# Patient Record
Sex: Female | Born: 1960
Health system: Southern US, Community
[De-identification: ages and names within clinical notes are randomized; demographics above are authoritative.]

## PROBLEM LIST (undated history)

## (undated) DIAGNOSIS — E559 Vitamin D deficiency, unspecified: Secondary | ICD-10-CM

## (undated) DIAGNOSIS — E669 Obesity, unspecified: Secondary | ICD-10-CM

## (undated) DIAGNOSIS — T7840XA Allergy, unspecified, initial encounter: Secondary | ICD-10-CM

## (undated) DIAGNOSIS — I10 Essential (primary) hypertension: Secondary | ICD-10-CM

## (undated) DIAGNOSIS — E785 Hyperlipidemia, unspecified: Secondary | ICD-10-CM

## (undated) HISTORY — DX: Vitamin D deficiency, unspecified: E55.9

## (undated) HISTORY — DX: Allergy, unspecified, initial encounter: T78.40XA

## (undated) HISTORY — DX: Hyperlipidemia, unspecified: E78.5

## (undated) HISTORY — DX: Essential (primary) hypertension: I10

## (undated) HISTORY — DX: Obesity, unspecified: E66.9

---

## 1986-11-07 HISTORY — PX: HEMORRHOID SURGERY: SHX153

## 1989-11-07 HISTORY — PX: ABDOMINAL HYSTERECTOMY: SHX81

## 1995-11-08 HISTORY — PX: BREAST BIOPSY: SHX20

## 1999-09-06 ENCOUNTER — Other Ambulatory Visit: Admission: RE | Admit: 1999-09-06 | Discharge: 1999-09-06 | Payer: Self-pay | Admitting: Family Medicine

## 2000-09-27 ENCOUNTER — Other Ambulatory Visit: Admission: RE | Admit: 2000-09-27 | Discharge: 2000-09-27 | Payer: Self-pay | Admitting: Family Medicine

## 2007-09-11 ENCOUNTER — Ambulatory Visit: Payer: Self-pay | Admitting: Family Medicine

## 2009-03-26 ENCOUNTER — Ambulatory Visit: Payer: Self-pay | Admitting: Family Medicine

## 2011-10-19 ENCOUNTER — Ambulatory Visit: Payer: Self-pay | Admitting: Family Medicine

## 2013-01-24 ENCOUNTER — Ambulatory Visit: Payer: Self-pay | Admitting: Family Medicine

## 2013-02-05 ENCOUNTER — Ambulatory Visit: Payer: Self-pay | Admitting: Family Medicine

## 2013-02-26 ENCOUNTER — Inpatient Hospital Stay: Payer: Self-pay | Admitting: Internal Medicine

## 2013-02-26 LAB — URINALYSIS, COMPLETE
Bilirubin,UR: NEGATIVE
Glucose,UR: NEGATIVE mg/dL (ref 0–75)
Nitrite: NEGATIVE
Protein: NEGATIVE
Squamous Epithelial: NONE SEEN

## 2013-02-26 LAB — COMPREHENSIVE METABOLIC PANEL
Anion Gap: 13 (ref 7–16)
Bilirubin,Total: 0.4 mg/dL (ref 0.2–1.0)
Calcium, Total: 8.3 mg/dL — ABNORMAL LOW (ref 8.5–10.1)
Co2: 24 mmol/L (ref 21–32)
Creatinine: 0.8 mg/dL (ref 0.60–1.30)
EGFR (African American): >60
Glucose: 110 mg/dL — ABNORMAL HIGH (ref 65–99)
Potassium: 3.1 mmol/L — ABNORMAL LOW (ref 3.5–5.1)
SGPT (ALT): 45 U/L (ref 12–78)
Sodium: 112 mmol/L — CL (ref 136–145)
Total Protein: 7.8 g/dL (ref 6.4–8.2)

## 2013-02-26 LAB — CBC
MCHC: 35.5 g/dL (ref 32.0–36.0)
RDW: 13.1 % (ref 11.5–14.5)
WBC: 9.9 10*3/uL (ref 3.6–11.0)

## 2013-02-26 LAB — TROPONIN I: Troponin-I: <0.02 ng/mL

## 2013-02-27 LAB — BASIC METABOLIC PANEL
Anion Gap: 5 — ABNORMAL LOW (ref 7–16)
BUN: 7 mg/dL (ref 7–18)
Calcium, Total: 8.1 mg/dL — ABNORMAL LOW (ref 8.5–10.1)
Chloride: 90 mmol/L — ABNORMAL LOW (ref 98–107)
Chloride: 91 mmol/L — ABNORMAL LOW (ref 98–107)
Co2: 29 mmol/L (ref 21–32)
Creatinine: 0.66 mg/dL (ref 0.60–1.30)
Creatinine: 0.79 mg/dL (ref 0.60–1.30)
EGFR (African American): >60
EGFR (Non-African Amer.): >60
EGFR (Non-African Amer.): >60
Glucose: 114 mg/dL — ABNORMAL HIGH (ref 65–99)
Osmolality: 252 (ref 275–301)
Potassium: 3.2 mmol/L — ABNORMAL LOW (ref 3.5–5.1)
Sodium: 124 mmol/L — ABNORMAL LOW (ref 136–145)
Sodium: 126 mmol/L — ABNORMAL LOW (ref 136–145)

## 2013-02-27 LAB — SODIUM: Sodium: 130 mmol/L — ABNORMAL LOW (ref 136–145)

## 2014-03-13 ENCOUNTER — Ambulatory Visit: Payer: Self-pay | Admitting: Family Medicine

## 2015-02-27 NOTE — H&P (Signed)
PATIENT NAME:  Christie Mcdonald, Christie Mcdonald MR#:  [id number] DATE OF BIRTH:  16-Feb-1961  DATE OF ADMISSION:  02/26/2013  PRIMARY CARE PHYSICIAN:  Dr. Dennison Mascot.   CHIEF COMPLAINT: Nausea, vomiting.   HISTORY OF PRESENT ILLNESS: A 54 year old female patient with past medical history of hypertension, has been having nausea and vomiting since Sunday. The patient is unable to keep anything down and having nausea, vomiting, which started on Sunday night after she visited her in-laws house. The patient's, I think,  mother-in-law was sick with nausea, vomiting, and stomach bug.  The patient started to have symptoms after she visited them and at the symptoms that started Sunday night, associated with vomiting and also 3 episodes of diarrhea. Her main complaint is nausea and vomiting, unable to keep anything down. The patient went to Dr. Clance Boll office today and found that she was confused with loss of balance and severely dehydrated, so she was sent here. The patient also had a fall at home today. According to her family, she is a little confused today and also feeling very weak and dehydrated and dizzy. She had multiple syncopal episodes with dizziness today. The patient found to have sodium of 112 in the Emergency Room on blood work. The patient had one dose of Phenergan in Dr. Clance Boll office and right now she does not have any nausea, denies any abdominal pain.   PAST MEDICAL HISTORY:  Significant for hypertension. She recently started on lisinopril 10 days ago by Dr. Thana Ates. She is also took one dose of HCTZ on Thursday or Friday to help with blood pressure, but it gave her a lot of cramps, so she did not take any further doses.   ALLERGIES: No known allergies.   SOCIAL HISTORY: No smoking. No drinking. Lives with her husband.   PAST SURGICAL HISTORY: Significant for hysterectomy, also significant for history of multiple hemorrhoid surgeries.    FAMILY HISTORY: Significant for hypertension in the  mother.    MEDICATIONS: Lisinopril 20 mg daily, Phenergan 25 mg every 6 hours p.r.n. and atropine with diphenoxylate 0.25/2.5 mg every 6 hours as needed for diarrhea.   HOSPITALIZATIONS:  The patient had no previous hospitalizations here.   REVIEW OF SYSTEMS:   CONSTITUTIONAL: The patient feels very weak and tired. No fever.  EYES: No blurred vision.  ENT: No tinnitus. No epistaxis. No difficulty swallowing.  RESPIRATORY: No cough.  CARDIOVASCULAR: No chest pain. No orthopnea.  GASTROINTESTINAL: The patient has nausea, vomiting since Sunday, (GENITOURINARY: No dysuria. ENDOCRINE: No polyuria, no polydipsia.  INTEGUMENTARY: Skin is very dry.  MUSCULOSKELETAL: No joint pain.  NEUROLOGIC: The patient is confused, as per the family, that is today and also had multiple episodes of syncope today.  PSYCHIATRIC: The patient is slightly anxious.    PHYSICAL EXAMINATION: VITAL SIGNS: Temperature is 98.2, heart rate 101, blood pressure 122/81, respirations 18, sats 100% on room air.  GENERAL:  The patient is alert, awake and oriented. Says that she could not remember some things and she feels a little confused even though she is able to answer all of my questions.  HEENT: Head atraumatic, normocephalic. Pupils equally reacting to light. Extraocular movements are intact.  ENT: No tympanic membrane congestion. No turbinate hypertrophy. No oropharyngeal erythema.  NECK: Normal range of motion. No JVD. No carotid bruit.  CARDIOVASCULAR: S1, S2 regular. No murmurs.  LUNGS: Clear to auscultation. No wheeze. No rales.  ABDOMEN: Soft, nontender, nondistended. Bowel sounds present.  EXTREMITIES: No extremity edema. No cyanosis. No clubbing.  NEUROLOGIC: The patient is alert, awake, oriented. She is slow to respond. Cranial nerves II through XII intact. Power 5/5 in upper and lower extremities. Sensation is intact. Deep tendon reflexes 2+ bilaterally.  PSYCHIATRIC: Mood and affect are within normal limits.    LABORATORY, DIAGNOSTIC AND RADIOLOGICAL DATA:   Electrolytes: Sodium 112, potassium 3.1, chloride 75, bicarbonate 24, BUN 10, creatinine 0.80, glucose 110. LFTs within normal limits. Urinalysis is clear and trace leukocyte esterase, WBC in the urine 2, bacteria none CBC: WBC 9.9, hemoglobin 13.4, hematocrit 37.7, platelets 31.8. Troponin less than 0.02.   EKG shows sinus rhythm with short PR of 100 beats per minute. No ST-T changes.   ASSESSMENT AND PLAN: This is a 54 year old female with intractable nausea and vomiting for 3 days with episodes of syncope and confusion today.  1.  Severe hypernatremia causing confusion and also syncope. Admit her to hospitalist service on telemetry. Check BMP every 4 hours, continue normal saline at 60 mL.  2.  Severe dehydration due to nausea, vomiting, likely due to stomach. Continue IV fluids and continue proton pump inhibitors and start her on clear liquid diet. Continue nausea medication.  3.  Hypertension. The patient's blood pressure is 122/80 and we can continue her lisinopril.  4.  Hyperkalemia secondary to dehydration with nausea, vomiting. Replace the potassium and IV fluids.  5. Altered mental status, metabolic encephalopathy secondary to hyponatremia, improving. We will follow the patient and continue to monitor on telemetry.   TIME SPENT:  About 55 minutes.     ____________________________ Katha HammingSnehalatha Eytan Carrigan, MD sk:cc D: 02/26/2013 20:30:12 ET T: 02/26/2013 21:11:19 ET JOB#: [id number]  cc: Katha HammingSnehalatha Shelah Heatley, MD, <Dictator> Dennison MascotLemont Morrisey, MD Katha HammingSNEHALATHA Kaz Auld MD ELECTRONICALLY SIGNED 03/18/2013 11:46

## 2015-02-27 NOTE — Discharge Summary (Signed)
PATIENT NAME:  Christie Mcdonald, Christie Mcdonald MR#:  [id number] DATE OF BIRTH:  January 13, 1961  DATE OF ADMISSION:  02/26/2013 DATE OF DISCHARGE:  02/27/2013  ADMISSION DIAGNOSES:  1.  Nausea and vomiting.  2.  Hyponatremia.   SECONDARY DIAGNOSES: 1.  HYPONATREMIA, POSSIBLY FROM THALIDONE, and nausea and vomiting.  2.  Nausea and vomiting, likely secondary to hyponatremia versus viral etiology.  3.  History of hypertension.   LABORATORIES: Sodium at discharge was 130. BUN and creatinine 7 and 0.79.   CT of the head showed no acute intracranial hemorrhage or CVA.   HOSPITAL COURSE:  This is a 54 year old female who presented with nausea and vomiting, found to have severe hyponatremia. For further details, please refer to the H and P.   1.  Hyponatremia: I suspect this was medication-induced. The patient was on thalidone, which could have caused hyponatremia, and then she had nausea and vomiting, probably from the hyponatremia, versus a viral infection which exacerbated her low sodium level. She was placed on IV fluids, and her sodium level did correct fairly rapidly over 24 hours. However, she has no neurological deficits. A CT of the head was non-acute. She has no neurological deficits. Her sodium at discharge is 130 after stopping any IV fluids.  2.  Nausea and vomiting, likely from viral gastroenteritis versus hyponatremia, which resolved, and she is tolerating her diet.  3.  Hypokalemia which was repleted. This was also from nausea and vomiting.  4. Hypertension: The patient should discontinue thalidone.and she can continue her blood pressure medication. Her blood pressure is about 104 at discharge systolically, so we are holding the blood pressure medications until she checks her blood pressure and makes sure her blood pressure, systolic, is greater than 110.   DISCHARGE MEDICATIONS: Lisinopril 20 mg daily once the blood pressure is greater than 110/60.   DISCHARGE DIET: Regular.   DISCHARGE ACTIVITY:  As tolerated.   DISCHARGE FOLLOWUP: The patient will follow up with Dr. Dennison MascotLemont Morrisey in 1 week.   Time spent: Approximately 35 minutes.      __________________ Janyth ContesSital P. Juliene PinaMody, MD spm:dm D: 02/27/2013 13:33:06 ET T: 02/27/2013 14:02:25 ET JOB#: [id number]  cc: Tyion Boylen P. Juliene PinaMody, MD, <Dictator> Dennison MascotLemont Morrisey, MD Janyth ContesSITAL P Kharter Brew MD ELECTRONICALLY SIGNED 02/27/2013 15:28

## 2015-03-19 ENCOUNTER — Other Ambulatory Visit: Payer: Self-pay | Admitting: Family Medicine

## 2015-03-19 DIAGNOSIS — Z1231 Encounter for screening mammogram for malignant neoplasm of breast: Secondary | ICD-10-CM

## 2015-04-02 ENCOUNTER — Ambulatory Visit
Admission: RE | Admit: 2015-04-02 | Discharge: 2015-04-02 | Disposition: A | Discharge status: Home / Self Care | Payer: BLUE CROSS/BLUE SHIELD | Source: Ambulatory Visit | Attending: Family Medicine | Admitting: Family Medicine

## 2015-04-02 DIAGNOSIS — Z1231 Encounter for screening mammogram for malignant neoplasm of breast: Secondary | ICD-10-CM | POA: Diagnosis present

## 2015-04-23 ENCOUNTER — Ambulatory Visit (INDEPENDENT_AMBULATORY_CARE_PROVIDER_SITE_OTHER): Payer: BLUE CROSS/BLUE SHIELD | Admitting: Family Medicine

## 2015-04-23 ENCOUNTER — Encounter: Payer: Self-pay | Admitting: Family Medicine

## 2015-04-23 VITALS — BP 132/68 | HR 64 | Temp 98.3°F | Resp 16 | Ht 61.0 in | Wt 161.4 lb

## 2015-04-23 DIAGNOSIS — I10 Essential (primary) hypertension: Secondary | ICD-10-CM | POA: Insufficient documentation

## 2015-04-23 DIAGNOSIS — E785 Hyperlipidemia, unspecified: Secondary | ICD-10-CM | POA: Diagnosis not present

## 2015-04-23 NOTE — Patient Instructions (Signed)
F/U 4 MO 

## 2015-04-23 NOTE — Progress Notes (Signed)
Name: Christie Mcdonald   MRN: [id number]    DOB: [birth year]   Date:04/23/2015       Progress Note  Subjective  Chief Complaint  Chief Complaint  Patient presents with  . Hyperlipidemia  . Hypertension    Hyperlipidemia This is a chronic problem. The current episode started more than 1 year ago. The problem is controlled. Recent lipid tests were reviewed and are normal. She has no history of diabetes or hypothyroidism. Associated symptoms include myalgias. Pertinent negatives include no chest pain, focal weakness or shortness of breath. Current antihyperlipidemic treatment includes statins. The current treatment provides moderate improvement of lipids. Compliance problems include medication side effects (Taking Crestor 2-3 times per week as tolerated because of myalgias).  Risk factors for coronary artery disease include dyslipidemia, hypertension and a sedentary lifestyle.  Hypertension This is a chronic problem. The current episode started more than 1 year ago. The problem has been gradually improving since onset. The problem is controlled. Associated symptoms include anxiety. Pertinent negatives include no blurred vision, chest pain, headaches, neck pain, orthopnea, palpitations or shortness of breath. There are no associated agents to hypertension. Risk factors for coronary artery disease include dyslipidemia and sedentary lifestyle. Past treatments include angiotensin blockers and beta blockers. The current treatment provides moderate improvement. There are no compliance problems.  There is no history of angina, CAD/MI or CVA.     Past Medical History  Diagnosis Date  . Hypertension   . Hyperlipidemia   . Vitamin D deficiency   . Obesity     History  Substance Use Topics  . Smoking status: Former Smoker    Quit date: 06/26/2014  . Smokeless tobacco: Not on file  . Alcohol Use: No     Current outpatient prescriptions:  .  atenolol (TENORMIN) 50 MG tablet, TAKE ONE TABLET BY  MOUTH EACH EVENING, Disp: , Rfl: 1 .  CRESTOR 5 MG tablet, 5 mg 2 (two) times a week. , Disp: , Rfl: 4 .  fluticasone (FLONASE) 50 MCG/ACT nasal spray, 2 (TWO) SUSPENSION, NASAL, IN EACH NOSTRIL DAILY, Disp: , Rfl: 2 .  losartan (COZAAR) 100 MG tablet, , Disp: , Rfl: 4  Allergies  Allergen Reactions  . Hydrochlorothiazide Other (See Comments)  . Lisinopril Cough    Review of Systems  Constitutional: Negative for fever, chills and weight loss.  HENT: Negative for congestion, hearing loss, sore throat and tinnitus.   Eyes: Negative for blurred vision, double vision and redness.  Respiratory: Negative for cough, hemoptysis and shortness of breath.   Cardiovascular: Negative for chest pain, palpitations, orthopnea, claudication and leg swelling.  Gastrointestinal: Negative for heartburn, nausea, vomiting, diarrhea, constipation and blood in stool.  Genitourinary: Negative for dysuria, urgency, frequency and hematuria.  Musculoskeletal: Positive for myalgias. Negative for back pain, joint pain, falls and neck pain.  Skin: Negative for itching.  Neurological: Negative for dizziness, tingling, tremors, focal weakness, seizures, loss of consciousness, weakness and headaches.  Endo/Heme/Allergies: Does not bruise/bleed easily.  Psychiatric/Behavioral: Negative for depression and substance abuse. The patient is not nervous/anxious and does not have insomnia.      Objective  Filed Vitals:   04/23/15 1044  BP: 132/68  Pulse: 64  Temp: 98.3 F (36.8 C)  Resp: 16  Height:  (1.549 m)  Weight: 161 lb 7 oz (73.228 kg)  SpO2: 94%     Physical Exam  Constitutional: She is oriented to person, place, and time and well-developed, well-nourished, and in no distress.  HENT:  Head: Normocephalic.  Eyes: EOM are normal. Pupils are equal, round, and reactive to light.  Neck: Normal range of motion. No thyromegaly present.  Cardiovascular: Normal rate, regular rhythm and normal heart sounds.    No murmur heard. Pulmonary/Chest: Effort normal and breath sounds normal.  Abdominal: Soft. Bowel sounds are normal.  Musculoskeletal: Normal range of motion. She exhibits no edema.  Neurological: She is alert and oriented to person, place, and time. No cranial nerve deficit. Gait normal.  Skin: Skin is warm and dry. No rash noted.  Psychiatric: Memory and affect normal.      Assessment & Plan  1. Essential hypertension Stable   2. Hyperlipemia - Lipid Profile

## 2015-05-15 ENCOUNTER — Other Ambulatory Visit: Payer: Self-pay | Admitting: Emergency Medicine

## 2015-05-15 DIAGNOSIS — K649 Unspecified hemorrhoids: Secondary | ICD-10-CM

## 2015-05-21 ENCOUNTER — Ambulatory Visit (INDEPENDENT_AMBULATORY_CARE_PROVIDER_SITE_OTHER): Payer: BLUE CROSS/BLUE SHIELD | Admitting: General Surgery

## 2015-05-21 ENCOUNTER — Encounter: Payer: Self-pay | Admitting: General Surgery

## 2015-05-21 VITALS — BP 140/80 | HR 82 | Resp 12 | Ht 60.0 in | Wt 160.0 lb

## 2015-05-21 DIAGNOSIS — Z1211 Encounter for screening for malignant neoplasm of colon: Secondary | ICD-10-CM

## 2015-05-21 DIAGNOSIS — K648 Other hemorrhoids: Secondary | ICD-10-CM | POA: Diagnosis not present

## 2015-05-21 DIAGNOSIS — K642 Third degree hemorrhoids: Secondary | ICD-10-CM

## 2015-05-21 MED ORDER — POLYETHYLENE GLYCOL 3350 17 GM/SCOOP PO POWD: ORAL | Status: DC

## 2015-05-21 NOTE — Progress Notes (Signed)
Patient ID: Christie Mcdonald, female   DOB: 1961/07/22, 54 y.o.   MRN: [id number]  Chief Complaint  Patient presents with  . Other    hemorrhoids    HPI Christie Mcdonald is a 54 y.o. female here today for a evaluation of hemorrhoids. Patient states she had hemorrhoids for about 20 years now. She states she has had a hemorrhoidectomy and banding in distant past. Patient had a flareup last week, lots of bleeding.   HPI  Past Medical History  Diagnosis Date  . Hypertension   . Hyperlipidemia   . Vitamin D deficiency   . Obesity     Past Surgical History  Procedure Laterality Date  . Breast biopsy Left 1997    negative  . Abdominal hysterectomy  1991  . Hemorrhoid surgery  1988    Dr Lorelee Newimothy Davis    Family History  Problem Relation Age of Onset  . Breast cancer Paternal Grandfather   . Breast cancer Cousin     Social History History  Substance Use Topics  . Smoking status: Former Smoker    Quit date: 06/26/2014  . Smokeless tobacco: Never Used  . Alcohol Use: No    Allergies  Allergen Reactions  . Hydrochlorothiazide Other (See Comments)  . Lisinopril Cough    Current Outpatient Prescriptions  Medication Sig Dispense Refill  . atenolol (TENORMIN) 50 MG tablet TAKE ONE TABLET BY MOUTH EACH EVENING  1  . Cholecalciferol (VITAMIN D3) 5000 UNITS CAPS Take 1 capsule by mouth daily.    . CRESTOR 5 MG tablet 5 mg 2 (two) times a week.   4  . fluticasone (FLONASE) 50 MCG/ACT nasal spray 2 (TWO) SUSPENSION, NASAL, IN EACH NOSTRIL DAILY  2  . losartan (COZAAR) 100 MG tablet   4  . Vitamin D, Ergocalciferol, (DRISDOL) 50000 UNITS CAPS capsule Take 50,000 Units by mouth every 7 (seven) days.    . polyethylene glycol powder (GLYCOLAX/MIRALAX) powder 255 grams one bottle for colonoscopy prep 255 g 0   No current facility-administered medications for this visit.    Review of Systems Review of Systems  Constitutional: Negative.   Respiratory: Negative.    Cardiovascular: Negative.   Gastrointestinal: Positive for anal bleeding. Negative for nausea, vomiting, abdominal pain, diarrhea, constipation, abdominal distention and rectal pain.    Blood pressure 140/80, pulse 82, resp. rate 12, height 5' (1.524 m), weight 160 lb (72.576 kg).  Physical Exam Physical Exam  Constitutional: She is oriented to person, place, and time. She appears well-nourished.  Eyes: Conjunctivae are normal. No scleral icterus.  Neck: Neck supple.  Cardiovascular: Normal rate and normal heart sounds.   Pulmonary/Chest: Effort normal and breath sounds normal.  Abdominal: Soft. Normal appearance.  Genitourinary: Rectal exam shows internal hemorrhoid ( prominent prolapsing internal hemorrhoid anterior location).  Lymphadenopathy:    She has no cervical adenopathy.  Neurological: She is alert and oriented to person, place, and time.  Skin: Skin is warm and dry.    Data Reviewed Notes reviewed  Assessment    Prolapsing internal hemorrhoid. Pt also has not had a colonoscopy. Discussed with her    Plan    Colonoscopy with possible biopsy/polypectomy prn: Information regarding the procedure, including its potential risks and complications (including but not limited to perforation of the bowel, which may require emergency surgery to repair, and bleeding) was verbally given to the patient. Educational information regarding lower instestinal endoscopy was given to the patient. Written instructions for how to complete the bowel  prep using Miralax were provided. The importance of drinking ample fluids to avoid dehydration as a result of the prep emphasized.     Patient has been scheduled for a colonoscopy and hemorrhoid banding on 06-10-15 at [facility].    PCP:  Morrisey, Lemont  SANKAR,SEEPLAPUTHUR G 05/22/2015, 5:00 PM    

## 2015-05-21 NOTE — Patient Instructions (Addendum)
Colonoscopy A colonoscopy is an exam to look at the entire large intestine (colon). This exam can help find problems such as tumors, polyps, inflammation, and areas of bleeding. The exam takes about 1 hour.  LET [facility] CARE PROVIDER KNOW ABOUT:   Any allergies you have.  All medicines you are taking, including vitamins, herbs, eye drops, creams, and over-the-counter medicines.  Previous problems you or members of your family have had with the use of anesthetics.  Any blood disorders you have.  Previous surgeries you have had.  Medical conditions you have. RISKS AND COMPLICATIONS  Generally, this is a safe procedure. However, as with any procedure, complications can occur. Possible complications include:  Bleeding.  Tearing or rupture of the colon wall.  Reaction to medicines given during the exam.  Infection (rare). BEFORE THE PROCEDURE   Ask your health care provider about changing or stopping your regular medicines.  You may be prescribed an oral bowel prep. This involves drinking a large amount of medicated liquid, starting the day before your procedure. The liquid will cause you to have multiple loose stools until your stool is almost clear or light green. This cleans out your colon in preparation for the procedure.  Do not eat or drink anything else once you have started the bowel prep, unless your health care provider tells you it is safe to do so.  Arrange for someone to drive you home after the procedure. PROCEDURE   You will be given medicine to help you relax (sedative).  You will lie on your side with your knees bent.  A long, flexible tube with a light and camera on the end (colonoscope) will be inserted through the rectum and into the colon. The camera sends video back to a computer screen as it moves through the colon. The colonoscope also releases carbon dioxide gas to inflate the colon. This helps your health care provider see the area better.  During  the exam, your health care provider may take a small tissue sample (biopsy) to be examined under a microscope if any abnormalities are found.  The exam is finished when the entire colon has been viewed. AFTER THE PROCEDURE   Do not drive for 24 hours after the exam.  You may have a small amount of blood in your stool.  You may pass moderate amounts of gas and have mild abdominal cramping or bloating. This is caused by the gas used to inflate your colon during the exam.  Ask when your test results will be ready and how you will get your results. Make sure you get your test results. Document Released: 10/21/2000 Document Revised: 08/14/2013 Document Reviewed: 07/01/2013 [facility] Patient Information 2015 [place], [place]. This information is not intended to replace advice given to you by your health care provider. Make sure you discuss any questions you have with your health care provider. Hemorrhoid Banding Hemorrhoids are veins in the anus and lower rectum that become enlarged. The most common symptoms are rectal bleeding, itching, and sometimes pain. Hemorrhoids might come out with straining or having a bowel movement, and they can sometimes be pushed back in. There are internal and external hemorrhoids. Only internal hemorrhoids can be treated with banding. In this procedure, a rubber band is placed near the hemorrhoid tissue, cutting off the blood supply. This procedure prevents the hemorrhoids from slipping down. LET YOUR CAREGIVER KNOW ABOUT: All medicines you are taking, especially blood thinners such as aspirin and coumadin.  RISKS AND COMPLICATIONS This is  not a painful procedure, but if you do have intense pain immediately let your surgeon know because the band may need to be removed. You may have some mild pain or discomfort in the first 2 days or so after treatment. Sometimes there may be delayed bleeding in the first week after treatment.  BEFORE THE PROCEDURE  There is no special  preparation needed before banding. Your surgeon may have you do an enema prior to the procedure. You will go home the same day.  HOME CARE INSTRUCTIONS   Your surgeon might instruct you to do sitz baths as needed if you have discomfort or after a bowel movement.  You may be instructed to use fiber supplements. SEEK MEDICAL CARE IF:  You have an increase in pain.  Your pain does not get better. SEEK IMMEDIATE MEDICAL CARE IF:  You have intense pain.  Fever greater than 100.5 F (38.1 C).  Bleeding that does not stop, or pus from the anus. Document Released: 08/21/2009 Document Revised: 01/16/2012 Document Reviewed: 08/21/2009 [facility] Patient Information 2015 [place], [place]. This information is not intended to replace advice given to you by your health care provider. Make sure you discuss any questions you have with your health care provider.  Patient [facility]

## 2015-05-22 ENCOUNTER — Encounter: Payer: Self-pay | Admitting: General Surgery

## 2015-05-27 ENCOUNTER — Encounter: Payer: Self-pay | Admitting: General Surgery

## 2015-06-04 ENCOUNTER — Other Ambulatory Visit: Payer: Self-pay | Admitting: General Surgery

## 2015-06-04 DIAGNOSIS — Z1211 Encounter for screening for malignant neoplasm of colon: Secondary | ICD-10-CM

## 2015-06-04 DIAGNOSIS — K642 Third degree hemorrhoids: Secondary | ICD-10-CM

## 2015-06-09 DIAGNOSIS — Z87891 Personal history of nicotine dependence: Secondary | ICD-10-CM | POA: Diagnosis not present

## 2015-06-09 DIAGNOSIS — K642 Third degree hemorrhoids: Secondary | ICD-10-CM | POA: Diagnosis not present

## 2015-06-09 DIAGNOSIS — Z79899 Other long term (current) drug therapy: Secondary | ICD-10-CM | POA: Diagnosis not present

## 2015-06-09 DIAGNOSIS — E559 Vitamin D deficiency, unspecified: Secondary | ICD-10-CM | POA: Diagnosis not present

## 2015-06-09 DIAGNOSIS — I10 Essential (primary) hypertension: Secondary | ICD-10-CM | POA: Diagnosis not present

## 2015-06-09 DIAGNOSIS — E669 Obesity, unspecified: Secondary | ICD-10-CM | POA: Diagnosis not present

## 2015-06-09 DIAGNOSIS — E785 Hyperlipidemia, unspecified: Secondary | ICD-10-CM | POA: Diagnosis not present

## 2015-06-09 DIAGNOSIS — Z1211 Encounter for screening for malignant neoplasm of colon: Secondary | ICD-10-CM | POA: Diagnosis present

## 2015-06-09 NOTE — Anesthesia Preprocedure Evaluation (Addendum)
Anesthesia Evaluation  Patient identified by MRN, date of birth, ID band Patient awake    Reviewed: Allergy & Precautions, H&P , NPO status , Patient's Chart, lab work & pertinent test results, reviewed documented beta blocker date and time   Airway Mallampati: III  TM Distance: >3 FB Neck ROM: limited    Dental no notable dental hx. (+) Teeth Intact   Pulmonary former smoker,  breath sounds clear to auscultation  Pulmonary exam normal       Cardiovascular Exercise Tolerance: Good hypertension, Normal cardiovascular examRhythm:regular Rate:Normal     Neuro/Psych negative neurological ROS  negative psych ROS   GI/Hepatic negative GI ROS, Neg liver ROS,   Endo/Other  negative endocrine ROS  Renal/GU negative Renal ROS  negative genitourinary   Musculoskeletal   Abdominal   Peds  Hematology negative hematology ROS (+)   Anesthesia Other Findings Past Medical History:   Hypertension                                                 Hyperlipidemia                                               Vitamin D deficiency                                         Obesity                                                     Signs and symptoms suggestive of sleep apnea    Reproductive/Obstetrics negative OB ROS                            Anesthesia Physical Anesthesia Plan  ASA: II  Anesthesia Plan: General   Post-op Pain Management:    Induction:   Airway Management Planned:   Additional Equipment:   Intra-op Plan:   Post-operative Plan:   Informed Consent: I have reviewed the patients History and Physical, chart, labs and discussed the procedure including the risks, benefits and alternatives for the proposed anesthesia with the patient or authorized representative who has indicated his/her understanding and acceptance.   Dental Advisory Given  Plan Discussed with: Anesthesiologist, CRNA and  Surgeon  Anesthesia Plan Comments:        Anesthesia Quick Evaluation

## 2015-06-10 ENCOUNTER — Ambulatory Visit
Admission: RE | Admit: 2015-06-10 | Discharge: 2015-06-10 | Disposition: A | Discharge status: Home / Self Care | Payer: BLUE CROSS/BLUE SHIELD | Source: Ambulatory Visit | Attending: General Surgery | Admitting: General Surgery

## 2015-06-10 ENCOUNTER — Ambulatory Visit: Payer: BLUE CROSS/BLUE SHIELD | Admitting: Anesthesiology

## 2015-06-10 ENCOUNTER — Encounter
Admission: RE | Disposition: A | Discharge status: Home / Self Care | Payer: Self-pay | Source: Ambulatory Visit | Attending: General Surgery

## 2015-06-10 ENCOUNTER — Encounter: Payer: Self-pay | Admitting: *Deleted

## 2015-06-10 DIAGNOSIS — E785 Hyperlipidemia, unspecified: Secondary | ICD-10-CM | POA: Insufficient documentation

## 2015-06-10 DIAGNOSIS — E669 Obesity, unspecified: Secondary | ICD-10-CM | POA: Insufficient documentation

## 2015-06-10 DIAGNOSIS — K642 Third degree hemorrhoids: Secondary | ICD-10-CM | POA: Diagnosis not present

## 2015-06-10 DIAGNOSIS — Z79899 Other long term (current) drug therapy: Secondary | ICD-10-CM | POA: Insufficient documentation

## 2015-06-10 DIAGNOSIS — Z87891 Personal history of nicotine dependence: Secondary | ICD-10-CM | POA: Insufficient documentation

## 2015-06-10 DIAGNOSIS — Z1211 Encounter for screening for malignant neoplasm of colon: Secondary | ICD-10-CM | POA: Diagnosis not present

## 2015-06-10 DIAGNOSIS — E559 Vitamin D deficiency, unspecified: Secondary | ICD-10-CM | POA: Insufficient documentation

## 2015-06-10 DIAGNOSIS — I10 Essential (primary) hypertension: Secondary | ICD-10-CM | POA: Insufficient documentation

## 2015-06-10 HISTORY — PX: COLONOSCOPY WITH PROPOFOL: SHX5780

## 2015-06-10 SURGERY — COLONOSCOPY WITH PROPOFOL
Anesthesia: General

## 2015-06-10 MED ORDER — LIDOCAINE HCL (CARDIAC) 20 MG/ML IV SOLN
INTRAVENOUS | Status: DC | PRN
  Administered 2015-06-10: 20 mg via INTRAVENOUS

## 2015-06-10 MED ORDER — PROPOFOL 10 MG/ML IV BOLUS
INTRAVENOUS | Status: DC | PRN
  Administered 2015-06-10: 30 mg via INTRAVENOUS
  Administered 2015-06-10: 20 mg via INTRAVENOUS
  Administered 2015-06-10: 40 mg via INTRAVENOUS
  Administered 2015-06-10: 70 mg via INTRAVENOUS
  Administered 2015-06-10: 30 mg via INTRAVENOUS
  Administered 2015-06-10: 40 mg via INTRAVENOUS

## 2015-06-10 MED ORDER — LACTATED RINGERS IV SOLN
INTRAVENOUS | Status: DC
  Administered 2015-06-10: 09:00:00 via INTRAVENOUS

## 2015-06-10 MED ORDER — PROPOFOL INFUSION 10 MG/ML OPTIME
INTRAVENOUS | Status: DC | PRN
  Administered 2015-06-10: 120 ug/kg/min via INTRAVENOUS

## 2015-06-10 NOTE — H&P (View-Only) (Signed)
Patient ID: Christie Mcdonald, female   DOB: 1961/07/22, 54 y.o.   MRN: [id number]  Chief Complaint  Patient presents with  . Other    hemorrhoids    HPI Christie Mcdonald is a 54 y.o. female here today for a evaluation of hemorrhoids. Patient states she had hemorrhoids for about 20 years now. She states she has had a hemorrhoidectomy and banding in distant past. Patient had a flareup last week, lots of bleeding.   HPI  Past Medical History  Diagnosis Date  . Hypertension   . Hyperlipidemia   . Vitamin D deficiency   . Obesity     Past Surgical History  Procedure Laterality Date  . Breast biopsy Left 1997    negative  . Abdominal hysterectomy  1991  . Hemorrhoid surgery  1988    Dr Lorelee Newimothy Davis    Family History  Problem Relation Age of Onset  . Breast cancer Paternal Grandfather   . Breast cancer Cousin     Social History History  Substance Use Topics  . Smoking status: Former Smoker    Quit date: 06/26/2014  . Smokeless tobacco: Never Used  . Alcohol Use: No    Allergies  Allergen Reactions  . Hydrochlorothiazide Other (See Comments)  . Lisinopril Cough    Current Outpatient Prescriptions  Medication Sig Dispense Refill  . atenolol (TENORMIN) 50 MG tablet TAKE ONE TABLET BY MOUTH EACH EVENING  1  . Cholecalciferol (VITAMIN D3) 5000 UNITS CAPS Take 1 capsule by mouth daily.    . CRESTOR 5 MG tablet 5 mg 2 (two) times a week.   4  . fluticasone (FLONASE) 50 MCG/ACT nasal spray 2 (TWO) SUSPENSION, NASAL, IN EACH NOSTRIL DAILY  2  . losartan (COZAAR) 100 MG tablet   4  . Vitamin D, Ergocalciferol, (DRISDOL) 50000 UNITS CAPS capsule Take 50,000 Units by mouth every 7 (seven) days.    . polyethylene glycol powder (GLYCOLAX/MIRALAX) powder 255 grams one bottle for colonoscopy prep 255 g 0   No current facility-administered medications for this visit.    Review of Systems Review of Systems  Constitutional: Negative.   Respiratory: Negative.    Cardiovascular: Negative.   Gastrointestinal: Positive for anal bleeding. Negative for nausea, vomiting, abdominal pain, diarrhea, constipation, abdominal distention and rectal pain.    Blood pressure 140/80, pulse 82, resp. rate 12, height 5' (1.524 m), weight 160 lb (72.576 kg).  Physical Exam Physical Exam  Constitutional: She is oriented to person, place, and time. She appears well-nourished.  Eyes: Conjunctivae are normal. No scleral icterus.  Neck: Neck supple.  Cardiovascular: Normal rate and normal heart sounds.   Pulmonary/Chest: Effort normal and breath sounds normal.  Abdominal: Soft. Normal appearance.  Genitourinary: Rectal exam shows internal hemorrhoid ( prominent prolapsing internal hemorrhoid anterior location).  Lymphadenopathy:    She has no cervical adenopathy.  Neurological: She is alert and oriented to person, place, and time.  Skin: Skin is warm and dry.    Data Reviewed Notes reviewed  Assessment    Prolapsing internal hemorrhoid. Pt also has not had a colonoscopy. Discussed with her    Plan    Colonoscopy with possible biopsy/polypectomy prn: Information regarding the procedure, including its potential risks and complications (including but not limited to perforation of the bowel, which may require emergency surgery to repair, and bleeding) was verbally given to the patient. Educational information regarding lower instestinal endoscopy was given to the patient. Written instructions for how to complete the bowel  prep using Miralax were provided. The importance of drinking ample fluids to avoid dehydration as a result of the prep emphasized.     Patient has been scheduled for a colonoscopy and hemorrhoid banding on 06-10-15 at [facility].    PCP:  Imelda Pillow 05/22/2015, 5:00 PM

## 2015-06-10 NOTE — Transfer of Care (Signed)
Immediate Anesthesia Transfer of Care Note  Patient: Christie Mcdonald  Procedure(s) Performed: Procedure(s): COLONOSCOPY WITH PROPOFOL (N/A)  Patient Location: PACU  Anesthesia Type:General  Level of Consciousness: sedated  Airway & Oxygen Therapy: Patient Spontanous Breathing and Patient connected to nasal cannula oxygen  Post-op Assessment: Report given to RN and Post -op Vital signs reviewed and stable  Post vital signs: Reviewed and stable  Last Vitals:  Filed Vitals:   06/10/15 0819  BP: 130/91  Pulse: 62  Temp: 36.5 C  Resp: 12    Complications: No apparent anesthesia complications

## 2015-06-10 NOTE — Op Note (Signed)
[facility] Patient Name: Christie Mcdonald Procedure Date: 06/10/2015 8:44 AM MRN: [id number] Account #: [id number] Date of Birth: January 29, 1961 Admit Type: Outpatient Age: 54 Room: [facility] ENDO ROOM 1 Gender: Female Note Status: Finalized Procedure:         Colonoscopy Indications:       Screening for colorectal malignant neoplasm Providers:         Clovia Cuff, MD Referring MD:      No Local Md, MD (Referring MD) Medicines:         General Anesthesia Complications:     No immediate complications. Procedure:         Pre-Anesthesia Assessment:                    - General anesthesia under the supervision of an                     anesthesiologist was determined to be medically necessary                     for this procedure based on review of the patient's                     medical history, medications, and prior anesthesia history.                    After obtaining informed consent, the colonoscope was                     passed under direct vision. Throughout the procedure, the                     patient's blood pressure, pulse, and oxygen saturations                     were monitored continuously. The Olympus PCF-H180AL                     colonoscope ( S#: [id number] ) was introduced through the                     anus and advanced to the the cecum, identified by the                     ileocecal valve. The colonoscopy was performed without                     difficulty. The patient tolerated the procedure well. The                     quality of the bowel preparation was good. Findings:      The perianal exam findings include non-thrombosed internal hemorrhoids       and internal hemorrhoids that prolapse with straining, but require       manual replacement into the anal canal (Grade III).      The exam was otherwise without abnormality. Impression:        - Non-thrombosed internal hemorrhoids and internal   hemorrhoids that prolapse with straining, but require                     manual replacement into the anal canal (Grade III) found  on perianal exam.                    - The examination was otherwise normal.                    - No specimens collected.                    Iinernal hemorrrhoid wss loced anteriorly and was                     uccessfully banded Recommendation:    - Return to endoscopist in 2 weeks. Procedure Code(s): --- Professional ---                    [id number], Colonoscopy, flexible; diagnostic, including                     collection of specimen(s) by brushing or washing, when                     performed (separate procedure)                    46221, Hemorrhoidectomy, internal, by rubber band                     ligation(s) Diagnosis Code(s): --- Professional ---                    Z12.11, Encounter for screening for malignant neoplasm of                     colon                    K64.2, Third degree hemorrhoids CPT copyright 2014 American Medical Association. All rights reserved. The codes documented in this report are preliminary and upon coder review may  be revised to meet current compliance requirements. Clovia Cuff, MD 06/10/2015 9:38:45 AM This report has been signed electronically. Number of Addenda: 0 Note Initiated On: 06/10/2015 8:44 AM Scope Withdrawal Time: 0 hours 7 minutes 6 seconds  Total Procedure Duration: 0 hours 29 minutes 28 seconds       [facility]

## 2015-06-10 NOTE — Interval H&P Note (Signed)
History and Physical Interval Note:  06/10/2015 8:47 AM  Christie Mcdonald  has presented today for surgery, with the diagnosis of RECTAL BLEED INT HEMS  The various methods of treatment have been discussed with the patient and family. After consideration of risks, benefits and other options for treatment, the patient has consented to  Procedure(s): COLONOSCOPY WITH PROPOFOL (N/A) as a surgical intervention .  The patient's history has been reviewed, patient examined, no change in status, stable for surgery.  I have reviewed the patient's chart and labs.  Questions were answered to the patient's satisfaction.     Regis Wiland G

## 2015-06-10 NOTE — Anesthesia Postprocedure Evaluation (Signed)
  Anesthesia Post-op Note  Patient: Christie Mcdonald  Procedure(s) Performed: Procedure(s): COLONOSCOPY WITH PROPOFOL (N/A)  Anesthesia type:General  Patient location: PACU  Post pain: Pain level controlled  Post assessment: Post-op Vital signs reviewed, Patient's Cardiovascular Status Stable, Respiratory Function Stable, Patent Airway and No signs of Nausea or vomiting  Post vital signs: Reviewed and stable  Last Vitals:  Filed Vitals:   06/10/15 0950  BP: 98/50  Pulse: 65  Temp:   Resp: 15    Level of consciousness: awake, alert  and patient cooperative  Complications: No apparent anesthesia complications

## 2015-06-11 ENCOUNTER — Encounter: Payer: Self-pay | Admitting: General Surgery

## 2015-06-17 ENCOUNTER — Encounter: Payer: Self-pay | Admitting: General Surgery

## 2015-06-17 ENCOUNTER — Ambulatory Visit (INDEPENDENT_AMBULATORY_CARE_PROVIDER_SITE_OTHER): Payer: BLUE CROSS/BLUE SHIELD | Admitting: General Surgery

## 2015-06-17 VITALS — BP 140/80 | HR 70 | Resp 12 | Ht 61.0 in | Wt 161.0 lb

## 2015-06-17 DIAGNOSIS — K642 Third degree hemorrhoids: Secondary | ICD-10-CM

## 2015-06-17 DIAGNOSIS — K648 Other hemorrhoids: Secondary | ICD-10-CM

## 2015-06-17 NOTE — Progress Notes (Signed)
This is a 54 year old female here today for hemorrhoid banding. Internal hemorrhoid at the 11 o clock position was not present on exam. This was the hemorrhoid that was banded.  Two tiny hemorrhoids are now visibile at the 3 o clock position. Will follow up in one month.

## 2015-06-17 NOTE — Patient Instructions (Signed)
Follow up in one month.

## 2015-06-24 ENCOUNTER — Ambulatory Visit: Payer: BLUE CROSS/BLUE SHIELD | Admitting: General Surgery

## 2015-07-21 ENCOUNTER — Ambulatory Visit: Payer: BLUE CROSS/BLUE SHIELD | Admitting: General Surgery

## 2015-08-06 ENCOUNTER — Encounter: Payer: Self-pay | Admitting: *Deleted

## 2015-08-20 ENCOUNTER — Other Ambulatory Visit: Payer: Self-pay | Admitting: Family Medicine

## 2015-08-25 ENCOUNTER — Ambulatory Visit (INDEPENDENT_AMBULATORY_CARE_PROVIDER_SITE_OTHER): Payer: BLUE CROSS/BLUE SHIELD | Admitting: Family Medicine

## 2015-08-25 ENCOUNTER — Telehealth: Payer: Self-pay | Admitting: Family Medicine

## 2015-08-25 ENCOUNTER — Encounter: Payer: Self-pay | Admitting: Family Medicine

## 2015-08-25 VITALS — BP 142/86 | HR 71 | Temp 98.4°F | Resp 16 | Ht 61.0 in | Wt 164.0 lb

## 2015-08-25 DIAGNOSIS — E785 Hyperlipidemia, unspecified: Secondary | ICD-10-CM | POA: Diagnosis not present

## 2015-08-25 DIAGNOSIS — I10 Essential (primary) hypertension: Secondary | ICD-10-CM

## 2015-08-25 DIAGNOSIS — R300 Dysuria: Secondary | ICD-10-CM | POA: Diagnosis not present

## 2015-08-25 LAB — POCT URINALYSIS DIPSTICK
Bilirubin, UA: NEGATIVE
Glucose, UA: NEGATIVE
Ketones, UA: NEGATIVE
Leukocytes, UA: NEGATIVE
Nitrite, UA: NEGATIVE
Protein, UA: NEGATIVE
Spec Grav, UA: 1.02
Urobilinogen, UA: NEGATIVE
pH, UA: 5

## 2015-08-25 MED ORDER — CRESTOR 5 MG PO TABS: 5.0000 mg | ORAL_TABLET | Freq: Once | ORAL | Status: DC

## 2015-08-25 MED ORDER — ROSUVASTATIN CALCIUM 5 MG PO TABS: 5.0000 mg | ORAL_TABLET | Freq: Every day | ORAL | Status: DC

## 2015-08-25 NOTE — Telephone Encounter (Signed)
Re-sent order to pharmacy.

## 2015-08-25 NOTE — Progress Notes (Signed)
Name: Christie Mcdonald   MRN: [id number]    DOB: 21-Sep-1961   Date:08/25/2015       Progress Note  Subjective  Chief Complaint  Chief Complaint  Patient presents with  . Hyperlipidemia  . Hypertension    pt here for 4 month follow up  . Obesity    HPI  Hypertension   Patient presents for follow-up of hypertension. It has been present for over 5 years.  Patient states that there is compliance with medical regimen which consists of losartan 100 mg daily and atenolol 50 mg daily . There is no end organ disease. Cardiac risk factors include hypertension hyperlipidemia and diabetes.  Exercise regimen consist of 9 .  Diet consist of limit salt .  Hyperlipidemia  Patient has a history of hyperlipidemia for several years.  Current medical regimen consist of Crestor 5 mg daily at bedtime .  Compliance is good .  Diet and exercise are currently followed some degree .  Risk factors for cardiovascular disease include hyperlipidemia , sedentary lifestyle, hypertension .   There have been no side effects from the medication.    Dysuria  Complaint of some mild discomfort with urination particularly on the lower perineal area. There is no hematuria no frequency or urgency and there is no vaginal discharge or odor. She has not had Pap smear in number of years.     Past Medical History  Diagnosis Date  . Hypertension   . Hyperlipidemia   . Vitamin D deficiency   . Obesity     Social History  Substance Use Topics  . Smoking status: Former Smoker    Quit date: 06/26/2014  . Smokeless tobacco: Never Used  . Alcohol Use: No     Current outpatient prescriptions:  .  atenolol (TENORMIN) 50 MG tablet, TAKE ONE TABLET BY MOUTH EACH EVENING, Disp: , Rfl: 1 .  Cholecalciferol (VITAMIN D3) 5000 UNITS CAPS, Take 1 capsule by mouth daily., Disp: , Rfl:  .  CRESTOR 5 MG tablet, 5 mg 2 (two) times a week. , Disp: , Rfl: 4 .  fluticasone (FLONASE) 50 MCG/ACT nasal spray, 2 (TWO) SUSPENSION, NASAL,  IN EACH NOSTRIL DAILY, Disp: 1 g, Rfl: 11 .  losartan (COZAAR) 100 MG tablet, , Disp: , Rfl: 4 .  polyethylene glycol powder (GLYCOLAX/MIRALAX) powder, 255 grams one bottle for colonoscopy prep, Disp: 255 g, Rfl: 0 .  Vitamin D, Ergocalciferol, (DRISDOL) 50000 UNITS CAPS capsule, Take 50,000 Units by mouth every 7 (seven) days., Disp: , Rfl:   Allergies  Allergen Reactions  . Hydrochlorothiazide Other (See Comments)  . Lisinopril Cough    Review of Systems  Constitutional: Negative for fever, chills and weight loss.  HENT: Negative for congestion, hearing loss, sore throat and tinnitus.   Eyes: Negative for blurred vision, double vision and redness.  Respiratory: Negative for cough, hemoptysis and shortness of breath.   Cardiovascular: Negative for chest pain, palpitations, orthopnea, claudication and leg swelling.  Gastrointestinal: Negative for heartburn, nausea, vomiting, diarrhea, constipation and blood in stool.  Genitourinary: Positive for dysuria. Negative for urgency, frequency and hematuria.  Musculoskeletal: Negative for myalgias, back pain, joint pain, falls and neck pain.  Skin: Negative for itching.  Neurological: Negative for dizziness, tingling, tremors, focal weakness, seizures, loss of consciousness, weakness and headaches.  Endo/Heme/Allergies: Does not bruise/bleed easily.  Psychiatric/Behavioral: Negative for depression and substance abuse. The patient is not nervous/anxious and does not have insomnia.      Objective  Filed Vitals:  08/25/15 1029  BP: 142/86  Pulse: 71  Temp: 98.4 F (36.9 C)  Resp: 16  Height: 5\' 1"  (1.549 m)  Weight: 164 lb (74.39 kg)  SpO2: 95%     Physical Exam  Constitutional: She is oriented to person, place, and time and well-developed, well-nourished, and in no distress.  No acute distress  HENT:  Head: Normocephalic.  Eyes: EOM are normal. Pupils are equal, round, and reactive to light.  Neck: Normal range of motion. No  thyromegaly present.  Cardiovascular: Normal rate, regular rhythm and normal heart sounds.   No murmur heard. Pulmonary/Chest: Effort normal and breath sounds normal.  Musculoskeletal: Normal range of motion. She exhibits no edema.  Neurological: She is alert and oriented to person, place, and time. No cranial nerve deficit. Gait normal.  Skin: Skin is warm and dry. No rash noted.  Psychiatric: Memory and affect normal.      Assessment & Plan  1. Dysuria Unremarkable urinalysis. If problem persists consider GYN evaluation and she definitely needs a Pap smear  - POCT urinalysis dipstick  2. Essential hypertension At goal of less than 150/90 - Comprehensive Metabolic Panel (CMET) - TSH  3. Hyperlipidemia Lipid and CMP panels. - Lipid panel

## 2015-08-25 NOTE — Telephone Encounter (Signed)
Crestor was sent to [facility] for due not substitute but the patient is requesting generic brand

## 2015-08-27 LAB — COMPREHENSIVE METABOLIC PANEL
A/G RATIO: 2 (ref 1.1–2.5)
ALT: 22 IU/L (ref 0–32)
AST: 22 IU/L (ref 0–40)
Albumin: 4.5 g/dL (ref 3.5–5.5)
Alkaline Phosphatase: 89 IU/L (ref 39–117)
BILIRUBIN TOTAL: 0.3 mg/dL (ref 0.0–1.2)
BUN / CREAT RATIO: 19 (ref 9–23)
BUN: 14 mg/dL (ref 6–24)
CHLORIDE: 98 mmol/L (ref 97–106)
CO2: 26 mmol/L (ref 18–29)
Calcium: 9.6 mg/dL (ref 8.7–10.2)
Creatinine, Ser: 0.74 mg/dL (ref 0.57–1.00)
GFR calc non Af Amer: 91 mL/min/{1.73_m2} (ref >59)
GFR, EST AFRICAN AMERICAN: 105 mL/min/{1.73_m2} (ref >59)
Globulin, Total: 2.3 g/dL (ref 1.5–4.5)
Glucose: 107 mg/dL — ABNORMAL HIGH (ref 65–99)
POTASSIUM: 4.9 mmol/L (ref 3.5–5.2)
Sodium: 139 mmol/L (ref 136–144)
TOTAL PROTEIN: 6.8 g/dL (ref 6.0–8.5)

## 2015-08-27 LAB — TSH: TSH: 3.29 u[IU]/mL (ref 0.450–4.500)

## 2015-08-27 LAB — LIPID PANEL
CHOLESTEROL TOTAL: 168 mg/dL (ref 100–199)
Chol/HDL Ratio: 4.1 ratio units (ref 0.0–4.4)
HDL: 41 mg/dL (ref >39)
LDL Calculated: 100 mg/dL — ABNORMAL HIGH (ref 0–99)
Triglycerides: 137 mg/dL (ref 0–149)
VLDL Cholesterol Cal: 27 mg/dL (ref 5–40)

## 2015-09-15 ENCOUNTER — Other Ambulatory Visit: Payer: Self-pay | Admitting: Family Medicine

## 2015-12-22 ENCOUNTER — Encounter: Payer: Self-pay | Admitting: Family Medicine

## 2015-12-22 ENCOUNTER — Ambulatory Visit (INDEPENDENT_AMBULATORY_CARE_PROVIDER_SITE_OTHER): Payer: BLUE CROSS/BLUE SHIELD | Admitting: Family Medicine

## 2015-12-22 VITALS — BP 130/84 | HR 87 | Temp 98.2°F | Resp 16 | Ht 61.0 in | Wt 165.7 lb

## 2015-12-22 DIAGNOSIS — E669 Obesity, unspecified: Secondary | ICD-10-CM | POA: Diagnosis not present

## 2015-12-22 DIAGNOSIS — Z01419 Encounter for gynecological examination (general) (routine) without abnormal findings: Secondary | ICD-10-CM

## 2015-12-22 DIAGNOSIS — Z1239 Encounter for other screening for malignant neoplasm of breast: Secondary | ICD-10-CM | POA: Diagnosis not present

## 2015-12-22 DIAGNOSIS — Z131 Encounter for screening for diabetes mellitus: Secondary | ICD-10-CM

## 2015-12-22 DIAGNOSIS — Z124 Encounter for screening for malignant neoplasm of cervix: Secondary | ICD-10-CM

## 2015-12-22 DIAGNOSIS — I10 Essential (primary) hypertension: Secondary | ICD-10-CM | POA: Diagnosis not present

## 2015-12-22 DIAGNOSIS — E785 Hyperlipidemia, unspecified: Secondary | ICD-10-CM | POA: Diagnosis not present

## 2015-12-22 DIAGNOSIS — R42 Dizziness and giddiness: Secondary | ICD-10-CM | POA: Diagnosis not present

## 2015-12-22 DIAGNOSIS — E559 Vitamin D deficiency, unspecified: Secondary | ICD-10-CM | POA: Diagnosis not present

## 2015-12-22 DIAGNOSIS — Z0000 Encounter for general adult medical examination without abnormal findings: Secondary | ICD-10-CM

## 2015-12-22 NOTE — Progress Notes (Signed)
Name: Christie Mcdonald   MRN: [id number]    DOB: 16-Jan-1961   Date:12/22/2015       Progress Note  Subjective  Chief Complaint  Chief Complaint  Patient presents with  . Annual Exam    mammogram (04/02/15), declines flu shot  . Labs Only  . Dizziness    patient stated that she has some episodes where she felt light-headed.    HPI  Well Woman : she denies urinary  incontinence , no pain during intercourse, but she does not have libido. She is s/p hysterectomy. Up to date with mammogram and colonoscopy .   HTN: she has changed her diet since last week, and she is having some dizziness over the past couple of days and bp at home has been low. Discussed staying hydrated and try taking half of Losartan if bp remains low.   Hyperlipidemia: taking medication only twice weekly, due for labs. Changed diet  Obesity: she is avoiding refined sugar. This is her heaviest weight and is working hard to lose weight.     Patient Active Problem List   Diagnosis Date Noted  . Obesity (BMI 30.0-34.9) 12/22/2015  . Vitamin D deficiency 12/22/2015  . Hypertension 04/23/2015  . Hyperlipemia 04/23/2015    Past Surgical History  Procedure Laterality Date  . Breast biopsy Left 1997    negative  . Abdominal hysterectomy  1991  . Hemorrhoid surgery  1988    Dr Lorelee New  . Colonoscopy with propofol N/A 06/10/2015    Procedure: COLONOSCOPY WITH PROPOFOL;  Surgeon: Kieth Brightly, MD;  Location: [facility] ENDOSCOPY;  Service: Endoscopy;  Laterality: N/A;    Family History  Problem Relation Age of Onset  . Breast cancer Paternal Grandfather   . Breast cancer Cousin     Social History   Social History  . Marital Status: Married    Spouse Name: N/A  . Number of Children: N/A  . Years of Education: N/A   Occupational History  . Not on file.   Social History Main Topics  . Smoking status: Former Smoker    Quit date: 06/26/2014  . Smokeless tobacco: Never Used  . Alcohol Use: No  .  Drug Use: No  . Sexual Activity: Not on file   Other Topics Concern  . Not on file   Social History Narrative     Current outpatient prescriptions:  .  atenolol (TENORMIN) 50 MG tablet, TAKE ONE TABLET BY MOUTH EACH EVENING, Disp: , Rfl: 1 .  Cholecalciferol (VITAMIN D3) 5000 UNITS CAPS, Take 1 capsule by mouth daily., Disp: , Rfl:  .  fluticasone (FLONASE) 50 MCG/ACT nasal spray, 2 (TWO) SUSPENSION, NASAL, IN EACH NOSTRIL DAILY, Disp: 1 g, Rfl: 11 .  losartan (COZAAR) 100 MG tablet, TAKE ONE TABLET BY MOUTH AT BEDTIME, Disp: 30 tablet, Rfl: 5 .  rosuvastatin (CRESTOR) 5 MG tablet, Take 1 tablet (5 mg total) by mouth daily., Disp: 30 tablet, Rfl: 4  Allergies  Allergen Reactions  . Hydrochlorothiazide Other (See Comments)  . Lisinopril Cough     ROS  Constitutional: Negative for fever or weight change.  Respiratory: Negative for cough and shortness of breath.   Cardiovascular: Negative for chest pain or palpitations.  Gastrointestinal: Negative for abdominal pain, no bowel changes.  Musculoskeletal: Negative for gait problem or joint swelling.  Skin: Negative for rash.  Neurological: Negative for dizziness or headache.  No other specific complaints in a complete review of systems (except as listed in HPI  above).  Objective  Filed Vitals:   12/22/15 1111  BP: 130/84  Pulse: 87  Temp: 98.2 F (36.8 C)  TempSrc: Oral  Resp: 16  Height:  (1.549 m)  Weight: 165 lb 11.2 oz (75.161 kg)  SpO2: 96%    Body mass index is 31.32 kg/(m^2).  Physical Exam  Constitutional: Patient appears well-developed and obese No distress.  HENT: Head: Normocephalic and atraumatic. Ears: B TMs ok, no erythema or effusion; Nose: Nose normal. Mouth/Throat: Oropharynx is clear and moist. No oropharyngeal exudate.  Eyes: Conjunctivae and EOM are normal. Pupils are equal, round, and reactive to light. No scleral icterus.  Neck: Normal range of motion. Neck supple. No JVD present. No  thyromegaly present.  Cardiovascular: Normal rate, regular rhythm and normal heart sounds.  No murmur heard. No BLE edema. Pulmonary/Chest: Effort normal and breath sounds normal. No respiratory distress. Abdominal: Soft. Bowel sounds are normal, no distension. There is no tenderness. no masses Breast: no lumps or masses, no nipple discharge or rashes FEMALE GENITALIA:  External genitalia normal External urethra normal Vaginal vault normal without discharge or lesions Cervix absent Bimanual exam normal without masses RECTAL: not done Musculoskeletal: Normal range of motion, no joint effusions. No gross deformities Neurological: he is alert and oriented to person, place, and time. No cranial nerve deficit. Coordination, balance, strength, speech and gait are normal.  Skin: Skin is warm and dry. No rash noted. No erythema.  Psychiatric: Patient has a normal mood and affect. behavior is normal. Judgment and thought content normal.  PHQ2/9: Depression screen [facility] 2/9 12/22/2015 08/25/2015 04/23/2015  Decreased Interest 0 0 0  Down, Depressed, Hopeless 0 0 0  PHQ - 2 Score 0 0 0     Fall Risk: Fall Risk  12/22/2015 04/23/2015  Falls in the past year? No No     Functional Status Survey: Is the patient deaf or have difficulty hearing?: No Does the patient have difficulty seeing, even when wearing glasses/contacts?: No (patient had eyes examined in August 2016) Does the patient have difficulty concentrating, remembering, or making decisions?: No Does the patient have difficulty walking or climbing stairs?: No Does the patient have difficulty dressing or bathing?: No Does the patient have difficulty doing errands alone such as visiting a doctor's office or shopping?: No    Assessment & Plan  1. Well woman exam  Discussed importance of 150 minutes of physical activity weekly, eat two servings of fish weekly, eat one serving of tree nuts ( cashews, pistachios, pecans, almonds.Marland Kitchen) every  other day, eat 6 servings of fruit/vegetables daily and drink plenty of water and avoid sweet beverages.   2. Obesity (BMI 30.0-34.9)  Discussed with the patient the risk posed by an increased BMI. Discussed importance of portion control, calorie counting and at least 150 minutes of physical activity weekly. Avoid sweet beverages and drink more water. Eat at least 6 servings of fruit and vegetables daily   3. Vitamin D deficiency  Continue supplementation   4. Cervical cancer screening   no need, status post -hysterectomy   5. Breast cancer screening  - MM Digital Screening; Future   6. Essential hypertension  - Comprehensive metabolic panel  7. Hyperlipemia  - Lipid panel  8. Screening for diabetes mellitus  - Hemoglobin A1c  9. Dizziness  - CBC with Differential/Platelet

## 2015-12-22 NOTE — Addendum Note (Signed)
Addended by: [name] F on: 12/22/2015 03:17 PM   Modules accepted: Kipp Brood

## 2015-12-29 ENCOUNTER — Ambulatory Visit: Payer: BLUE CROSS/BLUE SHIELD | Admitting: Family Medicine

## 2016-01-08 LAB — CBC WITH DIFFERENTIAL/PLATELET
Basophils Absolute: 0.1 10*3/uL (ref 0.0–0.2)
Basos: 1 %
EOS (ABSOLUTE): 0.3 10*3/uL (ref 0.0–0.4)
EOS: 6 %
HEMATOCRIT: 39.1 % (ref 34.0–46.6)
HEMOGLOBIN: 12.8 g/dL (ref 11.1–15.9)
Immature Grans (Abs): 0 10*3/uL (ref 0.0–0.1)
Immature Granulocytes: 0 %
LYMPHS ABS: 1 10*3/uL (ref 0.7–3.1)
Lymphs: 23 %
MCH: 30 pg (ref 26.6–33.0)
MCHC: 32.7 g/dL (ref 31.5–35.7)
MCV: 92 fL (ref 79–97)
MONOS ABS: 0.3 10*3/uL (ref 0.1–0.9)
Monocytes: 7 %
NEUTROS ABS: 2.9 10*3/uL (ref 1.4–7.0)
Neutrophils: 63 %
Platelets: 303 10*3/uL (ref 150–379)
RBC: 4.26 x10E6/uL (ref 3.77–5.28)
RDW: 13.8 % (ref 12.3–15.4)
WBC: 4.6 10*3/uL (ref 3.4–10.8)

## 2016-01-08 LAB — LIPID PANEL
CHOL/HDL RATIO: 3.7 ratio (ref 0.0–4.4)
Cholesterol, Total: 146 mg/dL (ref 100–199)
HDL: 39 mg/dL — ABNORMAL LOW (ref >39)
LDL CALC: 84 mg/dL (ref 0–99)
Triglycerides: 113 mg/dL (ref 0–149)
VLDL Cholesterol Cal: 23 mg/dL (ref 5–40)

## 2016-01-08 LAB — COMPREHENSIVE METABOLIC PANEL
ALT: 15 IU/L (ref 0–32)
AST: 18 IU/L (ref 0–40)
Albumin/Globulin Ratio: 1.9 (ref 1.1–2.5)
Albumin: 4.7 g/dL (ref 3.5–5.5)
Alkaline Phosphatase: 62 IU/L (ref 39–117)
BILIRUBIN TOTAL: 0.4 mg/dL (ref 0.0–1.2)
BUN / CREAT RATIO: 15 (ref 9–23)
BUN: 10 mg/dL (ref 6–24)
CO2: 27 mmol/L (ref 18–29)
CREATININE: 0.65 mg/dL (ref 0.57–1.00)
Calcium: 9.2 mg/dL (ref 8.7–10.2)
Chloride: 99 mmol/L (ref 96–106)
GFR calc Af Amer: 116 mL/min/{1.73_m2} (ref >59)
GFR calc non Af Amer: 100 mL/min/{1.73_m2} (ref >59)
GLUCOSE: 101 mg/dL — AB (ref 65–99)
Globulin, Total: 2.5 g/dL (ref 1.5–4.5)
Potassium: 4.7 mmol/L (ref 3.5–5.2)
SODIUM: 141 mmol/L (ref 134–144)
Total Protein: 7.2 g/dL (ref 6.0–8.5)

## 2016-01-08 LAB — HEMOGLOBIN A1C
Est. average glucose Bld gHb Est-mCnc: 126 mg/dL
HEMOGLOBIN A1C: 6 % — AB (ref 4.8–5.6)

## 2016-01-13 ENCOUNTER — Telehealth: Payer: Self-pay | Admitting: Emergency Medicine

## 2016-01-13 NOTE — Telephone Encounter (Signed)
Patient notified of lab results

## 2016-01-19 ENCOUNTER — Ambulatory Visit: Payer: BLUE CROSS/BLUE SHIELD | Admitting: Family Medicine

## 2016-02-03 ENCOUNTER — Other Ambulatory Visit: Payer: Self-pay | Admitting: Family Medicine

## 2016-02-16 ENCOUNTER — Ambulatory Visit: Payer: BLUE CROSS/BLUE SHIELD | Admitting: Family Medicine

## 2016-03-13 ENCOUNTER — Other Ambulatory Visit: Payer: Self-pay | Admitting: Family Medicine

## 2016-03-16 ENCOUNTER — Ambulatory Visit (INDEPENDENT_AMBULATORY_CARE_PROVIDER_SITE_OTHER): Payer: BLUE CROSS/BLUE SHIELD | Admitting: Family Medicine

## 2016-03-16 ENCOUNTER — Encounter: Payer: Self-pay | Admitting: Family Medicine

## 2016-03-16 ENCOUNTER — Other Ambulatory Visit: Payer: Self-pay | Admitting: Family Medicine

## 2016-03-16 VITALS — BP 124/76 | HR 74 | Temp 98.2°F | Resp 16 | Ht 61.0 in | Wt 163.0 lb

## 2016-03-16 DIAGNOSIS — I10 Essential (primary) hypertension: Secondary | ICD-10-CM | POA: Diagnosis not present

## 2016-03-16 DIAGNOSIS — E785 Hyperlipidemia, unspecified: Secondary | ICD-10-CM | POA: Diagnosis not present

## 2016-03-16 DIAGNOSIS — J069 Acute upper respiratory infection, unspecified: Secondary | ICD-10-CM

## 2016-03-16 MED ORDER — AZITHROMYCIN 250 MG PO TABS
ORAL_TABLET | ORAL | Status: DC
Start: 2016-03-16 — End: 2016-09-20

## 2016-03-16 MED ORDER — ATENOLOL 25 MG PO TABS: 25.0000 mg | ORAL_TABLET | Freq: Every day | ORAL | Status: DC

## 2016-03-16 MED ORDER — FLUTICASONE FUROATE-VILANTEROL 100-25 MCG/INH IN AEPB: 1.0000 | INHALATION_SPRAY | Freq: Every day | RESPIRATORY_TRACT | Status: DC

## 2016-03-16 MED ORDER — LOSARTAN POTASSIUM 100 MG PO TABS: 100.0000 mg | ORAL_TABLET | Freq: Every day | ORAL | Status: DC

## 2016-03-16 MED ORDER — DM-GUAIFENESIN ER 30-600 MG PO TB12: 1.0000 | ORAL_TABLET | Freq: Two times a day (BID) | ORAL | Status: DC

## 2016-03-16 NOTE — Progress Notes (Signed)
Name: Christie Mcdonald   MRN: [id number]    DOB: 1961/01/01   Date:03/16/2016       Progress Note  Subjective  Chief Complaint  Chief Complaint  Patient presents with  . URI    Onset-2 weeks, symptoms have been unchanged until today they are gradually improving having swollen glands in her neck, dry cough, nausea, bilateral ear pressure/pain-worst in the evening, sore throat. Patient has tried Tylenol and Delsym with some relief.    HPI  URI: she states that over the past 2 weeks she has noticed some sore throat ( felt full ), followed by a dry cough, some tender lymphadenopathy, post-nasal drainage, ear fullness and pain at night. She states tonsils gets swollen on and off. She felt nausea last night.   HTN: taking medication as prescribed, bp is checked intermittently, when she was stressed about her father in March bp was 140/80 but usually lower than that.   Hyperlipidemia: taking Crestor a couple times weekly and is working well, HDL was low and discussed ways to improve it  Patient Active Problem List   Diagnosis Date Noted  . Obesity (BMI 30.0-34.9) 12/22/2015  . Vitamin D deficiency 12/22/2015  . Hypertension 04/23/2015  . Hyperlipemia 04/23/2015    Past Surgical History  Procedure Laterality Date  . Breast biopsy Left 1997    negative  . Abdominal hysterectomy  1991  . Hemorrhoid surgery  1988    Dr Lorelee New  . Colonoscopy with propofol N/A 06/10/2015    Procedure: COLONOSCOPY WITH PROPOFOL;  Surgeon: Kieth Brightly, MD;  Location: [facility] ENDOSCOPY;  Service: Endoscopy;  Laterality: N/A;    Family History  Problem Relation Age of Onset  . Breast cancer Paternal Grandfather   . Cancer Paternal Grandfather     Breast  . Breast cancer Cousin   . Diabetes Mother   . Alzheimer's disease Mother   . Heart disease Mother     CHF  . Stroke Mother   . Dementia Mother   . Stroke Father   . Diabetes Maternal Grandmother   . Hypertension Sister   . Diabetes  Sister     Social History   Social History  . Marital Status: Married    Spouse Name: N/A  . Number of Children: N/A  . Years of Education: N/A   Occupational History  . Not on file.   Social History Main Topics  . Smoking status: Former Smoker    Quit date: 06/26/2014  . Smokeless tobacco: Never Used  . Alcohol Use: No  . Drug Use: No  . Sexual Activity: Not on file   Other Topics Concern  . Not on file   Social History Narrative     Current outpatient prescriptions:  .  atenolol (TENORMIN) 50 MG tablet, TAKE ONE TABLET BY MOUTH EACH EVENING, Disp: 90 tablet, Rfl: 1 .  Cholecalciferol (VITAMIN D3) 5000 UNITS CAPS, Take 1 capsule by mouth daily., Disp: , Rfl:  .  losartan (COZAAR) 100 MG tablet, TAKE ONE TABLET BY MOUTH AT BEDTIME, Disp: 30 tablet, Rfl: 5 .  rosuvastatin (CRESTOR) 5 MG tablet, Take 1 tablet (5 mg total) by mouth daily., Disp: 30 tablet, Rfl: 4 .  fluticasone (FLONASE) 50 MCG/ACT nasal spray, 2 (TWO) SUSPENSION, NASAL, IN EACH NOSTRIL DAILY (Patient not taking: Reported on 03/16/2016), Disp: 1 g, Rfl: 11  Allergies  Allergen Reactions  . Hydrochlorothiazide Other (See Comments)  . Lisinopril Cough     ROS  Constitutional: Negative  for fever, positive for mild  weight change.  Respiratory: Positive  for cough but no  shortness of breath.   Cardiovascular: Negative for chest pain or palpitations.  Gastrointestinal: Negative for abdominal pain, no bowel changes. ( had a couple episodes of lose stools last week but has resolved) Musculoskeletal: Negative for gait problem or joint swelling.  Skin: Negative for rash.  Neurological: Negative for dizziness or headache.  No other specific complaints in a complete review of systems (except as listed in HPI above).  Objective  Filed Vitals:   03/16/16 1143  BP: 124/76  Pulse: 74  Temp: 98.2 F (36.8 C)  TempSrc: Oral  Resp: 16  Height: 5\' 1"  (1.549 m)  Weight: 163 lb (73.936 kg)  SpO2: 96%     Body mass index is 30.81 kg/(m^2).  Physical Exam  Constitutional: Patient appears well-developed and well-nourished. Obese  No distress.  HEENT: head atraumatic, normocephalic, pupils equal and reactive to light, ears normal TM, neck supple, throat within normal limits, mild post-nasal drainage Cardiovascular: Normal rate, regular rhythm and normal heart sounds.  No murmur heard. No BLE edema. Pulmonary/Chest: Effort normal and breath sounds normal. No respiratory distress. Abdominal: Soft.  There is no tenderness. Psychiatric: Patient has a normal mood and affect. behavior is normal. Judgment and thought content normal.  Recent Results (from the past 2160 hour(s))  CBC with Differential/Platelet     Status: None   Collection Time: 01/07/16 11:46 AM  Result Value Ref Range   WBC 4.6 3.4 - 10.8 x10E3/uL   RBC 4.26 3.77 - 5.28 x10E6/uL   Hemoglobin 12.8 11.1 - 15.9 g/dL   Hematocrit 16.1 09.6 - 46.6 %   MCV 92 79 - 97 fL   MCH 30.0 26.6 - 33.0 pg   MCHC 32.7 31.5 - 35.7 g/dL   RDW 04.5 40.9 - 81.1 %   Platelets 303 150 - 379 x10E3/uL   Neutrophils 63 %   Lymphs 23 %   Monocytes 7 %   Eos 6 %   Basos 1 %   Neutrophils Absolute 2.9 1.4 - 7.0 x10E3/uL   Lymphocytes Absolute 1.0 0.7 - 3.1 x10E3/uL   Monocytes Absolute 0.3 0.1 - 0.9 x10E3/uL   EOS (ABSOLUTE) 0.3 0.0 - 0.4 x10E3/uL   Basophils Absolute 0.1 0.0 - 0.2 x10E3/uL   Immature Granulocytes 0 %   Immature Grans (Abs) 0.0 0.0 - 0.1 x10E3/uL  Lipid panel     Status: Abnormal   Collection Time: 01/07/16 11:46 AM  Result Value Ref Range   Cholesterol, Total 146 100 - 199 mg/dL   Triglycerides 914 0 - 149 mg/dL   HDL 39 (L) >78 mg/dL   VLDL Cholesterol Cal 23 5 - 40 mg/dL   LDL Calculated 84 0 - 99 mg/dL   Chol/HDL Ratio 3.7 0.0 - 4.4 ratio units    Comment:                                   T. Chol/HDL Ratio                                             Men  Women  1/2 Avg.Risk  3.4     3.3                                   Avg.Risk  5.0    4.4                                2X Avg.Risk  9.6    7.1                                3X Avg.Risk 23.4   11.0   Hemoglobin A1c     Status: Abnormal   Collection Time: 01/07/16 11:46 AM  Result Value Ref Range   Hgb A1c MFr Bld 6.0 (H) 4.8 - 5.6 %    Comment:          Pre-diabetes: 5.7 - 6.4          Diabetes: >6.4          Glycemic control for adults with diabetes: <7.0    Est. average glucose Bld gHb Est-mCnc 126 mg/dL  Comprehensive metabolic panel     Status: Abnormal   Collection Time: 01/07/16 11:46 AM  Result Value Ref Range   Glucose 101 (H) 65 - 99 mg/dL   BUN 10 6 - 24 mg/dL   Creatinine, Ser 1.61 0.57 - 1.00 mg/dL   GFR calc non Af Amer 100 >59 mL/min/1.73   GFR calc Af Amer 116 >59 mL/min/1.73   BUN/Creatinine Ratio 15 9 - 23   Sodium 141 134 - 144 mmol/L   Potassium 4.7 3.5 - 5.2 mmol/L   Chloride 99 96 - 106 mmol/L   CO2 27 18 - 29 mmol/L   Calcium 9.2 8.7 - 10.2 mg/dL   Total Protein 7.2 6.0 - 8.5 g/dL   Albumin 4.7 3.5 - 5.5 g/dL   Globulin, Total 2.5 1.5 - 4.5 g/dL   Albumin/Globulin Ratio 1.9 1.1 - 2.5    Comment: **Effective January 18, 2016 the reference interval**   for A/G Ratio will be changing to:              Age                Female          Female           0 -  7 days       1.1 - 2.3       1.1 - 2.3           8 - 30 days       1.2 - 2.8       1.2 - 2.8           1 -  6 months     1.3 - 3.6       1.3 - 3.6    7 months -  5 years      1.5 - 2.6       1.5 - 2.6              > 5 years      1.2 - 2.2       1.2 - 2.2    Bilirubin Total 0.4 0.0 - 1.2 mg/dL   Alkaline Phosphatase 62 39 - 117 IU/L  AST 18 0 - 40 IU/L   ALT 15 0 - 32 IU/L      PHQ2/9: [facility] screen [facility] 03/16/2016 12/22/2015 08/25/2015 04/23/2015  Decreased Interest 0 0 0 0  Down, Depressed, Hopeless 0 0 0 0  PHQ - 2 Score 0 0 0 0     Fall Risk: Fall Risk  03/16/2016 12/22/2015 04/23/2015  Falls in the past year? No  No No     Functional Status Survey: Is the patient deaf or have difficulty hearing?: No Does the patient have difficulty seeing, even when wearing glasses/contacts?: No Does the patient have difficulty concentrating, remembering, or making decisions?: No Does the patient have difficulty walking or climbing stairs?: No Does the patient have difficulty dressing or bathing?: No Does the patient have difficulty doing errands alone such as visiting a doctor's office or shopping?: No    Assessment & Plan  1. Essential hypertension  - atenolol (TENORMIN) 25 MG tablet; Take 1 tablet (25 mg total) by mouth at bedtime.  Dispense: 90 tablet; Refill: 1 - losartan (COZAAR) 100 MG tablet; Take 1 tablet (100 mg total) by mouth daily.  Dispense: 90 tablet; Refill: 1  2. Upper respiratory infection  - dextromethorphan-guaiFENesin (MUCINEX DM) 30-600 MG 12hr tablet; Take 1 tablet by mouth 2 (two) times daily.  Dispense: 20 tablet; Refill: 0 - fluticasone furoate-vilanterol (BREO ELLIPTA) 100-25 MCG/INH AEPB; Inhale 1 puff into the lungs daily.  Dispense: 60 each; Refill: 0 - azithromycin (ZITHROMAX) 250 MG tablet; Take as directed  Dispense: 6 each; Refill: 0  3. Hyperlipemia  Lipid panel shows low HDL : to improve HDL patient  needs to eat tree nuts ( pecans/pistachios/almonds ) four times weekly, eat fish two times weekly  and exercise  at least 150 minutes per week

## 2016-09-06 ENCOUNTER — Other Ambulatory Visit: Payer: Self-pay | Admitting: Family Medicine

## 2016-09-06 DIAGNOSIS — I10 Essential (primary) hypertension: Secondary | ICD-10-CM

## 2016-09-16 ENCOUNTER — Ambulatory Visit: Payer: BLUE CROSS/BLUE SHIELD | Admitting: Family Medicine

## 2016-09-20 ENCOUNTER — Ambulatory Visit (INDEPENDENT_AMBULATORY_CARE_PROVIDER_SITE_OTHER): Payer: BLUE CROSS/BLUE SHIELD | Admitting: Family Medicine

## 2016-09-20 ENCOUNTER — Encounter: Payer: Self-pay | Admitting: Family Medicine

## 2016-09-20 VITALS — BP 124/58 | HR 85 | Temp 98.3°F | Resp 16 | Ht 61.0 in | Wt 168.1 lb

## 2016-09-20 DIAGNOSIS — F4320 Adjustment disorder, unspecified: Secondary | ICD-10-CM | POA: Diagnosis not present

## 2016-09-20 DIAGNOSIS — Z1159 Encounter for screening for other viral diseases: Secondary | ICD-10-CM

## 2016-09-20 DIAGNOSIS — Z780 Asymptomatic menopausal state: Secondary | ICD-10-CM | POA: Insufficient documentation

## 2016-09-20 DIAGNOSIS — R739 Hyperglycemia, unspecified: Secondary | ICD-10-CM | POA: Diagnosis not present

## 2016-09-20 DIAGNOSIS — E782 Mixed hyperlipidemia: Secondary | ICD-10-CM

## 2016-09-20 DIAGNOSIS — I10 Essential (primary) hypertension: Secondary | ICD-10-CM | POA: Diagnosis not present

## 2016-09-20 DIAGNOSIS — R5383 Other fatigue: Secondary | ICD-10-CM | POA: Diagnosis not present

## 2016-09-20 DIAGNOSIS — F4321 Adjustment disorder with depressed mood: Secondary | ICD-10-CM

## 2016-09-20 DIAGNOSIS — F331 Major depressive disorder, recurrent, moderate: Secondary | ICD-10-CM | POA: Diagnosis not present

## 2016-09-20 DIAGNOSIS — M26629 Arthralgia of temporomandibular joint, unspecified side: Secondary | ICD-10-CM | POA: Insufficient documentation

## 2016-09-20 DIAGNOSIS — E669 Obesity, unspecified: Secondary | ICD-10-CM

## 2016-09-20 DIAGNOSIS — E66811 Obesity, class 1: Secondary | ICD-10-CM

## 2016-09-20 DIAGNOSIS — K219 Gastro-esophageal reflux disease without esophagitis: Secondary | ICD-10-CM | POA: Insufficient documentation

## 2016-09-20 MED ORDER — ATENOLOL 25 MG PO TABS: 25.0000 mg | ORAL_TABLET | Freq: Every day | ORAL | 0 refills | Status: DC

## 2016-09-20 MED ORDER — DULOXETINE HCL 30 MG PO CPEP: 30.0000 mg | ORAL_CAPSULE | Freq: Every day | ORAL | 0 refills | Status: DC

## 2016-09-20 MED ORDER — LOSARTAN POTASSIUM 100 MG PO TABS: 100.0000 mg | ORAL_TABLET | Freq: Every day | ORAL | 1 refills | Status: DC

## 2016-09-20 NOTE — Progress Notes (Signed)
Name: Christie Mcdonald   MRN: [id number]    DOB: 1961-09-02   Date:09/20/2016       Progress Note  Subjective  Chief Complaint  Chief Complaint  Patient presents with  . Hypertension    6 month follow up pt has been checking regularly    HPI   Major Depression: she had a severe episode of major depression, there is a family history of depression ( mother, aunts, sister and cousins ), she has no energy, having crying spells, feeling anxious and nervous, anhedonia, gaining weight, unable to focus, getting snappy. She has tried Prozac and Desyrel in the past. Her father has Parkinson's and is at nursing home, now she is grieving the loss of her mother that died 3 years ago   HTN: she is taking medication but her bp has increased a couple of times when she is very stressed.  Hyperglycemia: she has gained weight, denies polydipsia , polyuria or polyphagia  Fatigue: she has been feeling very tired and weak lately, not sure if secondary to depression    Patient Active Problem List   Diagnosis Date Noted  . Gastro-esophageal reflux disease without esophagitis 09/20/2016  . Menopause 09/20/2016  . Temporomandibular joint-pain-dysfunction syndrome 09/20/2016  . Hyperglycemia 09/20/2016  . Obesity (BMI 30.0-34.9) 12/22/2015  . Vitamin D deficiency 12/22/2015  . Hypertension 04/23/2015  . Hyperlipemia 04/23/2015    Past Surgical History:  Procedure Laterality Date  . ABDOMINAL HYSTERECTOMY  1991  . BREAST BIOPSY Left 1997   negative  . COLONOSCOPY WITH PROPOFOL N/A 06/10/2015   Procedure: COLONOSCOPY WITH PROPOFOL;  Surgeon: Kieth BrightlySeeplaputhur G Sankar, MD;  Location: [facility] END[facility]COPY;  Service: Endoscopy;  Laterality: N/A;  . HEMORRHOID SURGERY  1988   Dr Lorelee Newimothy Davis    Family History  Problem Relation Age of Onset  . Breast cancer Paternal Grandfather   . Cancer Paternal Grandfather     Breast  . Breast cancer Cousin   . Diabetes Mother   . Alzheimer's disease Mother   . Heart  disease Mother     CHF  . Stroke Mother   . Dementia Mother   . Stroke Father   . Diabetes Maternal Grandmother   . Hypertension Sister   . Diabetes Sister     Social History   Social History  . Marital status: Married    Spouse name: N/A  . Number of children: N/A  . Years of education: N/A   Occupational History  . Not on file.   Social History Main Topics  . Smoking status: Former Smoker    Quit date: 06/26/2014  . Smokeless tobacco: Never Used  . Alcohol use No  . Drug use: No  . Sexual activity: Not on file   Other Topics Concern  . Not on file   Social History Narrative  . No narrative on file     Current Outpatient Prescriptions:  .  atenolol (TENORMIN) 25 MG tablet, TAKE 1 TABLET (25 MG TOTAL) BY MOUTH AT BEDTIME., Disp: 90 tablet, Rfl: 0 .  Cholecalciferol (VITAMIN D3) 5000 UNITS CAPS, Take 1 capsule by mouth daily., Disp: , Rfl:  .  fluticasone (FLONASE) 50 MCG/ACT nasal spray, 2 (TWO) SUSPENSION, NASAL, IN EACH NOSTRIL DAILY (Patient not taking: Reported on 03/16/2016), Disp: 1 g, Rfl: 11 .  losartan (COZAAR) 100 MG tablet, Take 1 tablet (100 mg total) by mouth daily., Disp: 90 tablet, Rfl: 1 .  rosuvastatin (CRESTOR) 5 MG tablet, Take 1 tablet (5 mg total)  by mouth daily., Disp: 30 tablet, Rfl: 4  Allergies  Allergen Reactions  . Hydrochlorothiazide Other (See Comments)  . Lisinopril Cough     ROS  Constitutional: Negative for fever or weight change.  Respiratory: Negative for cough and shortness of breath.   Cardiovascular: Negative for chest pain or palpitations.  Gastrointestinal: Negative for abdominal pain, no bowel changes.  Musculoskeletal: Negative for gait problem or joint swelling.  Skin: Negative for rash.  Neurological: Negative for dizziness or headache.  No other specific complaints in a complete review of systems (except as listed in HPI above).  Objective  Vitals:   09/20/16 1144  BP: (!) 124/58  Pulse: 85  Resp: 16  Temp:  98.3 F (36.8 C)  TempSrc: Oral  SpO2: 91%  Weight: 168 lb 1 oz (76.2 kg)  Height: 5\' 1"  (1.549 m)    Body mass index is 31.76 kg/m.  Physical Exam  Constitutional: Patient appears well-developed and well-nourished. Obese No distress.  HEENT: head atraumatic, normocephalic, pupils equal and reactive to light, neck supple, throat within normal limits Cardiovascular: Normal rate, regular rhythm and normal heart sounds.  No murmur heard. No BLE edema. Pulmonary/Chest: Effort normal and breath sounds normal. No respiratory distress. Abdominal: Soft.  There is no tenderness. Psychiatric: Patient has a normal mood and affect. behavior is normal. Judgment and thought content normal.  PHQ2/9: Depression screen [facility] 09/20/2016 03/16/2016 12/22/2015 08/25/2015 04/23/2015  Decreased Interest 0 0 0 0 0  Down, Depressed, Hopeless 0 0 0 0 0  PHQ - 2 Score 0 0 0 0 0    Fall Risk: Fall Risk  09/20/2016 03/16/2016 12/22/2015 04/23/2015  Falls in the past year? No No No No    Functional Status Survey: Is the patient deaf or have difficulty hearing?: No Does the patient have difficulty seeing, even when wearing glasses/contacts?: No Does the patient have difficulty concentrating, remembering, or making decisions?: No Does the patient have difficulty walking or climbing stairs?: No Does the patient have difficulty dressing or bathing?: No Does the patient have difficulty doing errands alone such as visiting a doctor's office or shopping?: No    Assessment & Plan  1. Hyperglycemia  - Hemoglobin A1c  2. Essential hypertension  Well controlled - atenolol (TENORMIN) 25 MG tablet; Take 1 tablet (25 mg total) by mouth at bedtime.  Dispense: 90 tablet; Refill: 0 - losartan (COZAAR) 100 MG tablet; Take 1 tablet (100 mg total) by mouth daily.  Dispense: 90 tablet; Refill: 1  3. Mixed hyperlipidemia  Continue life style modification   4. Grieving  She will contact hospice for grieve  counseling   5. Obesity (BMI 30.0-34.9)  Discussed with the patient the risk posed by an increased BMI. Discussed importance of portion control, calorie counting and at least 150 minutes of physical activity weekly. Avoid sweet beverages and drink more water. Eat at least 6 servings of fruit and vegetables daily   6. Major  recurrent major depression (HCC)  Discussed medication management and we will try Cymbalta, discussed possible side effects  7. Need for hepatitis C screening test  - Hepatitis C antibody  8. Other fatigue  - TSH - Vitamin B12 - VITAMIN D 25 Hydroxy (Vit-D Deficiency, Fractures) - CBC with Differential/Platelet - COMPLETE METABOLIC PANEL WITH GFR

## 2016-09-22 ENCOUNTER — Other Ambulatory Visit: Payer: Self-pay | Admitting: Family Medicine

## 2016-09-22 DIAGNOSIS — R5383 Other fatigue: Secondary | ICD-10-CM | POA: Diagnosis not present

## 2016-09-22 DIAGNOSIS — R739 Hyperglycemia, unspecified: Secondary | ICD-10-CM | POA: Diagnosis not present

## 2016-09-22 DIAGNOSIS — Z1159 Encounter for screening for other viral diseases: Secondary | ICD-10-CM | POA: Diagnosis not present

## 2016-09-23 LAB — CBC WITH DIFFERENTIAL/PLATELET
BASOS: 1 %
Basophils Absolute: 0 10*3/uL (ref 0.0–0.2)
EOS (ABSOLUTE): 0.3 10*3/uL (ref 0.0–0.4)
EOS: 6 %
Hematocrit: 38.4 % (ref 34.0–46.6)
Hemoglobin: 12.5 g/dL (ref 11.1–15.9)
IMMATURE GRANS (ABS): 0 10*3/uL (ref 0.0–0.1)
IMMATURE GRANULOCYTES: 0 %
LYMPHS: 23 %
Lymphocytes Absolute: 1.2 10*3/uL (ref 0.7–3.1)
MCH: 30.3 pg (ref 26.6–33.0)
MCHC: 32.6 g/dL (ref 31.5–35.7)
MCV: 93 fL (ref 79–97)
MONOCYTES: 8 %
Monocytes Absolute: 0.4 10*3/uL (ref 0.1–0.9)
NEUTROS PCT: 62 %
Neutrophils Absolute: 3.4 10*3/uL (ref 1.4–7.0)
PLATELETS: 303 10*3/uL (ref 150–379)
RBC: 4.12 x10E6/uL (ref 3.77–5.28)
RDW: 13.7 % (ref 12.3–15.4)
WBC: 5.4 10*3/uL (ref 3.4–10.8)

## 2016-09-23 LAB — COMPREHENSIVE METABOLIC PANEL
A/G RATIO: 1.6 (ref 1.2–2.2)
ALT: 13 IU/L (ref 0–32)
AST: 20 IU/L (ref 0–40)
Albumin: 4.4 g/dL (ref 3.5–5.5)
Alkaline Phosphatase: 83 IU/L (ref 39–117)
BUN/Creatinine Ratio: 24 — ABNORMAL HIGH (ref 9–23)
BUN: 17 mg/dL (ref 6–24)
Bilirubin Total: 0.2 mg/dL (ref 0.0–1.2)
CALCIUM: 9.3 mg/dL (ref 8.7–10.2)
CO2: 26 mmol/L (ref 18–29)
Chloride: 98 mmol/L (ref 96–106)
Creatinine, Ser: 0.71 mg/dL (ref 0.57–1.00)
GFR, EST AFRICAN AMERICAN: 110 mL/min/{1.73_m2} (ref >59)
GFR, EST NON AFRICAN AMERICAN: 96 mL/min/{1.73_m2} (ref >59)
Globulin, Total: 2.7 g/dL (ref 1.5–4.5)
Glucose: 100 mg/dL — ABNORMAL HIGH (ref 65–99)
POTASSIUM: 5.1 mmol/L (ref 3.5–5.2)
Sodium: 139 mmol/L (ref 134–144)
TOTAL PROTEIN: 7.1 g/dL (ref 6.0–8.5)

## 2016-09-23 LAB — HCV COMMENT:

## 2016-09-23 LAB — HEPATITIS C ANTIBODY (REFLEX)

## 2016-09-23 LAB — TSH: TSH: 2.62 u[IU]/mL (ref 0.450–4.500)

## 2016-09-23 LAB — VITAMIN D 25 HYDROXY (VIT D DEFICIENCY, FRACTURES): VIT D 25 HYDROXY: 74.7 ng/mL (ref 30.0–100.0)

## 2016-09-23 LAB — HGB A1C W/O EAG: Hgb A1c MFr Bld: 6.2 % — ABNORMAL HIGH (ref 4.8–5.6)

## 2016-09-23 LAB — VITAMIN B12: Vitamin B-12: 374 pg/mL (ref 211–946)

## 2016-10-11 ENCOUNTER — Other Ambulatory Visit: Payer: Self-pay | Admitting: Family Medicine

## 2016-10-17 ENCOUNTER — Other Ambulatory Visit: Payer: Self-pay | Admitting: Family Medicine

## 2016-10-17 ENCOUNTER — Telehealth: Payer: Self-pay | Admitting: Family Medicine

## 2016-10-17 MED ORDER — ROSUVASTATIN CALCIUM 5 MG PO TABS: 5.0000 mg | ORAL_TABLET | Freq: Every day | ORAL | 4 refills | Status: DC

## 2016-10-17 NOTE — Telephone Encounter (Signed)
PT IS NEEDING REFILLS ON HER ROSUVASTATIN. SAYS THAT THE PHARM HAS SENT IN 2 REQUEST WITH NO RESPONSE. THEY ASKED THE PATIENT TO CALL US .

## 2016-10-17 NOTE — Telephone Encounter (Signed)
It was in Dr. Clance BollMorrisey's inbox. Sent today.

## 2016-10-18 NOTE — Telephone Encounter (Signed)
lft message on phone . Medication has been called in

## 2016-10-25 ENCOUNTER — Ambulatory Visit: Payer: BLUE CROSS/BLUE SHIELD | Admitting: Family Medicine

## 2016-12-22 ENCOUNTER — Other Ambulatory Visit: Payer: Self-pay | Admitting: Family Medicine

## 2016-12-22 DIAGNOSIS — I10 Essential (primary) hypertension: Secondary | ICD-10-CM

## 2016-12-27 ENCOUNTER — Other Ambulatory Visit: Payer: Self-pay | Admitting: Family Medicine

## 2016-12-28 ENCOUNTER — Other Ambulatory Visit: Payer: Self-pay

## 2016-12-28 MED ORDER — FLUTICASONE PROPIONATE 50 MCG/ACT NA SUSP: 2.0000 | Freq: Every day | NASAL | 3 refills | Status: DC

## 2016-12-28 NOTE — Telephone Encounter (Signed)
Patient requesting refill of Flonase to [facility] [place].

## 2017-03-07 ENCOUNTER — Ambulatory Visit: Payer: BLUE CROSS/BLUE SHIELD | Admitting: Family Medicine

## 2017-03-16 ENCOUNTER — Encounter: Payer: Self-pay | Admitting: Family Medicine

## 2017-03-16 ENCOUNTER — Ambulatory Visit (INDEPENDENT_AMBULATORY_CARE_PROVIDER_SITE_OTHER): Payer: BLUE CROSS/BLUE SHIELD | Admitting: Family Medicine

## 2017-03-16 VITALS — BP 128/74 | HR 72 | Temp 98.2°F | Resp 16 | Ht 61.0 in | Wt 165.4 lb

## 2017-03-16 DIAGNOSIS — E538 Deficiency of other specified B group vitamins: Secondary | ICD-10-CM

## 2017-03-16 DIAGNOSIS — I10 Essential (primary) hypertension: Secondary | ICD-10-CM | POA: Diagnosis not present

## 2017-03-16 DIAGNOSIS — E669 Obesity, unspecified: Secondary | ICD-10-CM | POA: Diagnosis not present

## 2017-03-16 DIAGNOSIS — R739 Hyperglycemia, unspecified: Secondary | ICD-10-CM

## 2017-03-16 DIAGNOSIS — E782 Mixed hyperlipidemia: Secondary | ICD-10-CM | POA: Diagnosis not present

## 2017-03-16 DIAGNOSIS — E559 Vitamin D deficiency, unspecified: Secondary | ICD-10-CM

## 2017-03-16 MED ORDER — LOSARTAN POTASSIUM 100 MG PO TABS: 100.0000 mg | ORAL_TABLET | Freq: Every day | ORAL | 1 refills | Status: DC

## 2017-03-16 MED ORDER — ATENOLOL 25 MG PO TABS: 25.0000 mg | ORAL_TABLET | Freq: Every day | ORAL | 1 refills | Status: DC

## 2017-03-16 NOTE — Progress Notes (Signed)
Name: Christie Mcdonald   MRN: [id number]    DOB: February 14, 1961   Date:03/16/2017       Progress Note  Subjective  Chief Complaint  Chief Complaint  Patient presents with  . Medication Refill    6 month F/U  . Hypertension    Denies any symptoms  . Hyperlipidemia    Takes weekly due to feeling stiff  . Depression    Lost her dad recently due to the Flu.    HPI  HTN: she is taking medication daily for bp, no side effects. No chest pain , but when she skipped a dose of Atenolol she felt palpitation   Hyperglycemia: she denies polydipsia , polyuria or polyphagia  Grieving: she lost her father April 2018, she never filled Cymbalta, she states focus is better, she is feeling more rested now, advised hospice counseling.   Obesity: discussed life style modification, lost a few pounds sine last visit, she will resume walking more often  Vitamin D and B12 deficiency: she is taking supplementation  Cramps: happens sporadically, not sure what makes it happen, advised to stay hydrated, stretch.   Patient Active Problem List   Diagnosis Date Noted  . Gastro-esophageal reflux disease without esophagitis 09/20/2016  . Menopause 09/20/2016  . Temporomandibular joint-pain-dysfunction syndrome 09/20/2016  . Hyperglycemia 09/20/2016  . Obesity (BMI 30.0-34.9) 12/22/2015  . Vitamin D deficiency 12/22/2015  . Hypertension 04/23/2015  . Hyperlipemia 04/23/2015    Past Surgical History:  Procedure Laterality Date  . ABDOMINAL HYSTERECTOMY  1991  . BREAST BIOPSY Left 1997   negative  . COLONOSCOPY WITH PROPOFOL N/A 06/10/2015   Procedure: COLONOSCOPY WITH PROPOFOL;  Surgeon: Kieth BrightlySeeplaputhur G Sankar, MD;  Location: [facility] ENDOSCOPY;  Service: Endoscopy;  Laterality: N/A;  . HEMORRHOID SURGERY  1988   Dr Lorelee Newimothy Davis    Family History  Problem Relation Age of Onset  . Breast cancer Paternal Grandfather   . Cancer Paternal Grandfather        Breast  . Diabetes Mother   . Alzheimer's disease  Mother   . Heart disease Mother        CHF  . Stroke Mother   . Dementia Mother   . Stroke Father   . Kidney disease Father   . Parkinson's disease Father   . Diabetes Maternal Grandmother   . Hypertension Sister   . Diabetes Sister   . Breast cancer Cousin     Social History   Social History  . Marital status: Married    Spouse name: N/A  . Number of children: N/A  . Years of education: N/A   Occupational History  . Not on file.   Social History Main Topics  . Smoking status: Former Smoker    Quit date: 06/26/2014  . Smokeless tobacco: Never Used  . Alcohol use No  . Drug use: No  . Sexual activity: Not on file   Other Topics Concern  . Not on file   Social History Narrative  . No narrative on file     Current Outpatient Prescriptions:  .  atenolol (TENORMIN) 25 MG tablet, Take 1 tablet (25 mg total) by mouth daily., Disp: 90 tablet, Rfl: 1 .  Cholecalciferol (VITAMIN D3) 5000 UNITS CAPS, Take 1 capsule by mouth daily., Disp: , Rfl:  .  Cyanocobalamin (B-12 SL), Place 1 drop under the tongue daily., Disp: , Rfl:  .  fluticasone (FLONASE) 50 MCG/ACT nasal spray, Place 2 sprays into both nostrils daily., Disp: 16 g,  Rfl: 3 .  losartan (COZAAR) 100 MG tablet, Take 1 tablet (100 mg total) by mouth daily., Disp: 90 tablet, Rfl: 1 .  rosuvastatin (CRESTOR) 5 MG tablet, Take 1 tablet (5 mg total) by mouth daily. (Patient taking differently: Take 5 mg by mouth once a week. ), Disp: 30 tablet, Rfl: 4  Allergies  Allergen Reactions  . Hydrochlorothiazide Other (See Comments)  . Lisinopril Cough     ROS  Constitutional: Negative for fever or weight change.  Respiratory: Negative for cough and shortness of breath.   Cardiovascular: Negative for chest pain or palpitations.  Gastrointestinal: Negative for abdominal pain, no bowel changes.  Musculoskeletal: Negative for gait problem or joint swelling.  Skin: Negative for rash.  Neurological: Negative for dizziness or  headache.  No other specific complaints in a complete review of systems (except as listed in HPI above).  Objective  Vitals:   03/16/17 1140  BP: 128/74  Pulse: 72  Resp: 16  Temp: 98.2 F (36.8 C)  TempSrc: Oral  SpO2: 98%  Weight: 165 lb 6.4 oz (75 kg)  Height: 5\' 1"  (1.549 m)    Body mass index is 31.25 kg/m.  Physical Exam  Constitutional: Patient appears well-developed and well-nourished. Obese No distress.  HEENT: head atraumatic, normocephalic, pupils equal and reactive to light,  neck supple, throat within normal limits Cardiovascular: Normal rate, regular rhythm and normal heart sounds.  No murmur heard. No BLE edema. Pulmonary/Chest: Effort normal and breath sounds normal. No respiratory distress. Abdominal: Soft.  There is no tenderness. Psychiatric: Patient has a normal mood and affect. behavior is normal. Judgment and thought content normal.  PHQ2/9: Depression screen [facility] 2/9 03/16/2017 09/20/2016 03/16/2016 12/22/2015 08/25/2015  Decreased Interest 0 0 0 0 0  Down, Depressed, Hopeless 0 0 0 0 0  PHQ - 2 Score 0 0 0 0 0     Fall Risk: Fall Risk  03/16/2017 09/20/2016 03/16/2016 12/22/2015 04/23/2015  Falls in the past year? No No No No No    Functional Status Survey: Is the patient deaf or have difficulty hearing?: No Does the patient have difficulty seeing, even when wearing glasses/contacts?: No Does the patient have difficulty concentrating, remembering, or making decisions?: No Does the patient have difficulty walking or climbing stairs?: No Does the patient have difficulty dressing or bathing?: No Does the patient have difficulty doing errands alone such as visiting a doctor's office or shopping?: No    Assessment & Plan  1. Essential hypertension  - losartan (COZAAR) 100 MG tablet; Take 1 tablet (100 mg total) by mouth daily.  Dispense: 90 tablet; Refill: 1 - atenolol (TENORMIN) 25 MG tablet; Take 1 tablet (25 mg total) by mouth daily.  Dispense:  90 tablet; Refill: 1 - COMPLETE METABOLIC PANEL WITH GFR  2. Hyperglycemia  - Hemoglobin A1c - Insulin, fasting  3. Mixed hyperlipidemia  - Lipid panel  4. Obesity (BMI 30.0-34.9)  - Hemoglobin A1c - Insulin, fasting  5. Vitamin D deficiency  - VITAMIN D 25 Hydroxy (Vit-D Deficiency, Fractures)  6. B12 deficiency  - Vitamin B12

## 2017-03-24 ENCOUNTER — Other Ambulatory Visit: Payer: Self-pay | Admitting: Family Medicine

## 2017-03-24 DIAGNOSIS — I10 Essential (primary) hypertension: Secondary | ICD-10-CM

## 2017-03-24 NOTE — Telephone Encounter (Signed)
[facility]  

## 2017-08-11 ENCOUNTER — Other Ambulatory Visit: Payer: Self-pay | Admitting: Family Medicine

## 2017-08-11 DIAGNOSIS — Z1231 Encounter for screening mammogram for malignant neoplasm of breast: Secondary | ICD-10-CM

## 2017-08-15 ENCOUNTER — Ambulatory Visit
Admission: RE | Admit: 2017-08-15 | Discharge: 2017-08-15 | Disposition: A | Discharge status: Home / Self Care | Payer: BLUE CROSS/BLUE SHIELD | Source: Ambulatory Visit | Attending: Family Medicine | Admitting: Family Medicine

## 2017-08-15 DIAGNOSIS — Z1231 Encounter for screening mammogram for malignant neoplasm of breast: Secondary | ICD-10-CM | POA: Insufficient documentation

## 2017-09-07 ENCOUNTER — Other Ambulatory Visit: Payer: Self-pay | Admitting: Family Medicine

## 2017-09-07 DIAGNOSIS — I10 Essential (primary) hypertension: Secondary | ICD-10-CM

## 2017-09-07 NOTE — Telephone Encounter (Signed)
Hypertension medication request: [facility] [employer]  Last office visit pertaining to hypertension:  BP Readings from Last 3 Encounters:  03/16/17 128/74  09/20/16 (!) 124/58  03/16/16 124/76    Lab Results  Component Value Date   CREATININE 0.71 09/22/2016   BUN 17 09/22/2016   NA 139 09/22/2016   K 5.1 09/22/2016   CL 98 09/22/2016   CO2 26 09/22/2016     Next visit: 09/19/2017.

## 2017-09-14 ENCOUNTER — Other Ambulatory Visit: Payer: Self-pay | Admitting: Family Medicine

## 2017-09-14 DIAGNOSIS — I10 Essential (primary) hypertension: Secondary | ICD-10-CM

## 2017-09-14 NOTE — Telephone Encounter (Signed)
Refill request for Hypertension medication:  Losartan 100 mg  Last office visit pertaining to hypertension: 03/16/2017  BP Readings from Last 3 Encounters:  03/16/17 128/74  09/20/16 (!) 124/58  03/16/16 124/76    Lab Results  Component Value Date   CREATININE 0.71 09/22/2016   BUN 17 09/22/2016   NA 139 09/22/2016   K 5.1 09/22/2016   CL 98 09/22/2016   CO2 26 09/22/2016   Follow up visit: 09/19/2017

## 2017-09-15 ENCOUNTER — Other Ambulatory Visit: Payer: Self-pay | Admitting: Family Medicine

## 2017-09-19 ENCOUNTER — Ambulatory Visit: Payer: BLUE CROSS/BLUE SHIELD | Admitting: Family Medicine

## 2017-09-19 ENCOUNTER — Encounter: Payer: Self-pay | Admitting: Family Medicine

## 2017-09-19 VITALS — BP 150/100 | HR 86 | Resp 14 | Ht 61.0 in | Wt 169.7 lb

## 2017-09-19 DIAGNOSIS — E538 Deficiency of other specified B group vitamins: Secondary | ICD-10-CM | POA: Diagnosis not present

## 2017-09-19 DIAGNOSIS — E782 Mixed hyperlipidemia: Secondary | ICD-10-CM | POA: Diagnosis not present

## 2017-09-19 DIAGNOSIS — E669 Obesity, unspecified: Secondary | ICD-10-CM | POA: Diagnosis not present

## 2017-09-19 DIAGNOSIS — E559 Vitamin D deficiency, unspecified: Secondary | ICD-10-CM

## 2017-09-19 DIAGNOSIS — I10 Essential (primary) hypertension: Secondary | ICD-10-CM

## 2017-09-19 DIAGNOSIS — R739 Hyperglycemia, unspecified: Secondary | ICD-10-CM

## 2017-09-19 DIAGNOSIS — F411 Generalized anxiety disorder: Secondary | ICD-10-CM | POA: Diagnosis not present

## 2017-09-19 DIAGNOSIS — F331 Major depressive disorder, recurrent, moderate: Secondary | ICD-10-CM | POA: Diagnosis not present

## 2017-09-19 DIAGNOSIS — F4321 Adjustment disorder with depressed mood: Secondary | ICD-10-CM | POA: Diagnosis not present

## 2017-09-19 MED ORDER — AMLODIPINE BESYLATE-VALSARTAN 5-160 MG PO TABS: 1.0000 | ORAL_TABLET | Freq: Every day | ORAL | 0 refills | Status: DC

## 2017-09-19 NOTE — Patient Instructions (Signed)
Suicidal Feelings: How to Help Yourself  Suicide is the taking of one's own life. If you feel as though life is getting too tough to handle and are thinking about suicide, get help right away. To get help:  Call your local emergency services (911 in the [place]  Call a suicide hotline to speak with a trained counselor who understands how you are feeling. The following is a list of suicide hotlines in the [place]. For a list of hotlines in [place], visit www.suicide.org/hotlines/international/[place]-suicide-hotlines.html.  1-800-273-TALK (1-800-273-8255).  1-800-SUICIDE (1-800-784-2433).  1-888-628-9454. This is a hotline for Spanish speakers.  1-800-799-4TTY (1-800-799-4889). This is a hotline for TTY users.  1-866-4-U-TREVOR (1-866-488-7386). This is a hotline for lesbian, gay, bisexual, transgender, or questioning youth.  Contact a crisis center or a local suicide prevention center. To find a crisis center or suicide prevention center:  Call your local hospital, clinic, community service organization, mental health center, social service provider, or health department. Ask for assistance in connecting to a crisis center.  Visit www.suicidepreventionlifeline.org/getinvolved/locator for a list of crisis centers in the [place], or visit www.suicideprevention.ca/thinking-about-suicide/find-a-crisis-centre for a list of centers in [place].  Visit the following websites:  National Suicide Prevention Lifeline: www.suicidepreventionlifeline.org  Hopeline: www.hopeline.com  American Foundation for Suicide Prevention: www.afsp.org  The Trevor Project (for lesbian, gay, bisexual, transgender, or questioning youth): www.thetrevorproject.org    How can I help myself feel better?  Promise yourself that you will not do anything drastic when you have suicidal feelings. Remember, there is hope. Many people have gotten through suicidal thoughts and feelings, and you will, too. You may have gotten through them before, and  this proves that you can get through them again.  Let family, friends, teachers, or counselors know how you are feeling. Try not to isolate yourself from those who care about you. Remember, they will want to help you. Talk with someone every day, even if you do not feel sociable. Face-to-face conversation is best.  Call a mental health professional and see one regularly.  Visit your primary health care provider every year.  Eat a well-balanced diet, and space your meals so you eat regularly.  Get plenty of rest.  Avoid alcohol and drugs, and remove them from your home. They will only make you feel worse.  If you are thinking of taking a lot of medicine, give your medicine to someone who can give it to you one day at a time. If you are on antidepressants and are concerned you will overdose, let your health care provider know so he or she can give you safer medicines. Ask your mental health professional about the possible side effects of any medicines you are taking.  Remove weapons, poisons, knives, and anything else that could harm you from your home.  Try to stick to routines. Follow a schedule every day. Put self-care on your schedule.  Make a list of realistic goals, and cross them off when you achieve them. Accomplishments give a sense of worth.  Wait until you are feeling better before doing the things you find difficult or unpleasant.  Exercise if you are able. You will feel better if you exercise for even a half hour each day.  Go out in the sun or into nature. This will help you recover from depression faster. If you have a favorite place to walk, go there.  Do the things that have always given you pleasure. Play your favorite music, read a good book, paint a picture, play your favorite   anything else that takes your mind off your depression if it is safe to do.  Keep your living space well lit.  When you are feeling well, write  yourself a letter about tips and support that you can read when you are not feeling well.  Remember that life's difficulties can be sorted out with help. Conditions can be treated. You can work on thoughts and strategies that serve you well. This information is not intended to replace advice given to you by your health care provider. Make sure you discuss any questions you have with your health care provider. Document Released: 04/30/2003 Document Revised: 06/22/2016 Document Reviewed: 02/18/2014 Elsevier Interactive Patient Education  2017 Elsevier Inc. DASH Eating Plan DASH stands for "Dietary Approaches to Stop Hypertension." The DASH eating plan is a healthy eating plan that has been shown to reduce high blood pressure (hypertension). It may also reduce your risk for type 2 diabetes, heart disease, and stroke. The DASH eating plan may also help with weight loss. What are tips for following this plan? General guidelines  Avoid eating more than 2,300 mg (milligrams) of salt (sodium) a day. If you have hypertension, you may need to reduce your sodium intake to 1,500 mg a day.  Limit alcohol intake to no more than 1 drink a day for nonpregnant women and 2 drinks a day for men. One drink equals 12 oz of beer, 5 oz of wine, or 1 oz of hard liquor.  Work with your health care provider to maintain a healthy body weight or to lose weight. Ask what an ideal weight is for you.  Get at least 30 minutes of exercise that causes your heart to beat faster (aerobic exercise) most days of the week. Activities may include walking, swimming, or biking.  Work with your health care provider or diet and nutrition specialist (dietitian) to adjust your eating plan to your individual calorie needs. Reading food labels  Check food labels for the amount of sodium per serving. Choose foods with less than 5 percent of the Daily Value of sodium. Generally, foods with less than 300 mg of sodium per serving fit into this  eating plan.  To find whole grains, look for the word "whole" as the first word in the ingredient list. Shopping  Buy products labeled as "low-sodium" or "no salt added."  Buy fresh foods. Avoid canned foods and premade or frozen meals. Cooking  Avoid adding salt when cooking. Use salt-free seasonings or herbs instead of table salt or sea salt. Check with your health care provider or pharmacist before using salt substitutes.  Do not fry foods. Cook foods using healthy methods such as baking, boiling, grilling, and broiling instead.  Cook with heart-healthy oils, such as olive, canola, soybean, or sunflower oil. Meal planning   Eat a balanced diet that includes: ? 5 or more servings of fruits and vegetables each day. At each meal, try to fill half of your plate with fruits and vegetables. ? Up to 6-8 servings of whole grains each day. ? Less than 6 oz of lean meat, poultry, or fish each day. A 3-oz serving of meat is about the same size as a deck of cards. One egg equals 1 oz. ? 2 servings of low-fat dairy each day. ? A serving of nuts, seeds, or beans 5 times each week. ? Heart-healthy fats. Healthy fats called Omega-3 fatty acids are found in foods such as flaxseeds and coldwater fish, like sardines, salmon, and mackerel.  Limit how  much you eat of the following: ? Canned or prepackaged foods. ? Food that is high in trans fat, such as fried foods. ? Food that is high in saturated fat, such as fatty meat. ? Sweets, desserts, sugary drinks, and other foods with added sugar. ? Full-fat dairy products.  Do not salt foods before eating.  Try to eat at least 2 vegetarian meals each week.  Eat more home-cooked food and less restaurant, buffet, and fast food.  When eating at a restaurant, ask that your food be prepared with less salt or no salt, if possible. What foods are recommended? The items listed may not be a complete list. Talk with your dietitian about what dietary choices  are best for you. Grains Whole-grain or whole-wheat bread. Whole-grain or whole-wheat pasta. Brown rice. Orpah Cobbatmeal. Quinoa. Bulgur. Whole-grain and low-sodium cereals. Pita bread. Low-fat, low-sodium crackers. Whole-wheat flour tortillas. Vegetables Fresh or frozen vegetables (raw, steamed, roasted, or grilled). Low-sodium or reduced-sodium tomato and vegetable juice. Low-sodium or reduced-sodium tomato sauce and tomato paste. Low-sodium or reduced-sodium canned vegetables. Fruits All fresh, dried, or frozen fruit. Canned fruit in natural juice (without added sugar). Meat and other protein foods Skinless chicken or Malawiturkey. Ground chicken or [place]urkey. Pork with fat trimmed off. Fish and seafood. Egg whites. Dried beans, peas, or lentils. Unsalted nuts, nut butters, and seeds. Unsalted canned beans. Lean cuts of beef with fat trimmed off. Low-sodium, lean deli meat. Dairy Low-fat (1%) or fat-free (skim) milk. Fat-free, low-fat, or reduced-fat cheeses. Nonfat, low-sodium ricotta or cottage cheese. Low-fat or nonfat yogurt. Low-fat, low-sodium cheese. Fats and oils Soft margarine without trans fats. Vegetable oil. Low-fat, reduced-fat, or light mayonnaise and salad dressings (reduced-sodium). Canola, safflower, olive, soybean, and sunflower oils. Avocado. Seasoning and other foods Herbs. Spices. Seasoning mixes without salt. Unsalted popcorn and pretzels. Fat-free sweets. What foods are not recommended? The items listed may not be a complete list. Talk with your dietitian about what dietary choices are best for you. Grains Baked goods made with fat, such as croissants, muffins, or some breads. Dry pasta or rice meal packs. Vegetables Creamed or fried vegetables. Vegetables in a cheese sauce. Regular canned vegetables (not low-sodium or reduced-sodium). Regular canned tomato sauce and paste (not low-sodium or reduced-sodium). Regular tomato and vegetable juice (not low-sodium or reduced-sodium). Rosita FirePickles.  Olives. Fruits Canned fruit in a light or heavy syrup. Fried fruit. Fruit in cream or butter sauce. Meat and other protein foods Fatty cuts of meat. Ribs. Fried meat. Tomasa BlaseBacon. Sausage. Bologna and other processed lunch meats. Salami. Fatback. Hotdogs. Bratwurst. Salted nuts and seeds. Canned beans with added salt. Canned or smoked fish. Whole eggs or egg yolks. Chicken or Malawiturkey with skin. Dairy Whole or 2% milk, cream, and half-and-half. Whole or full-fat cream cheese. Whole-fat or sweetened yogurt. Full-fat cheese. Nondairy creamers. Whipped toppings. Processed cheese and cheese spreads. Fats and oils Butter. Stick margarine. Lard. Shortening. Ghee. Bacon fat. Tropical oils, such as coconut, palm kernel, or palm oil. Seasoning and other foods Salted popcorn and pretzels. Onion salt, garlic salt, seasoned salt, table salt, and sea salt. Worcestershire sauce. Tartar sauce. Barbecue sauce. Teriyaki sauce. Soy sauce, including reduced-sodium. Steak sauce. Canned and packaged gravies. Fish sauce. Oyster sauce. Cocktail sauce. Horseradish that you find on the shelf. Ketchup. Mustard. Meat flavorings and tenderizers. Bouillon cubes. Hot sauce and Tabasco sauce. Premade or packaged marinades. Premade or packaged taco seasonings. Relishes. Regular salad dressings. Where to find more information:  National Heart, Lung, and Blood Institute: PopSteam.iswww.nhlbi.nih.gov  American Heart Association: www.heart.org Summary  The DASH eating plan is a healthy eating plan that has been shown to reduce high blood pressure (hypertension). It may also reduce your risk for type 2 diabetes, heart disease, and stroke.  With the DASH eating plan, you should limit salt (sodium) intake to 2,300 mg a day. If you have hypertension, you may need to reduce your sodium intake to 1,500 mg a day.  When on the DASH eating plan, aim to eat more fresh fruits and vegetables, whole grains, lean proteins, low-fat dairy, and heart-healthy  fats.  Work with your health care provider or diet and nutrition specialist (dietitian) to adjust your eating plan to your individual calorie needs. This information is not intended to replace advice given to you by your health care provider. Make sure you discuss any questions you have with your health care provider. Document Released: 10/13/2011 Document Revised: 10/17/2016 Document Reviewed: 10/17/2016 Elsevier Interactive Patient Education  2017 [employer]

## 2017-09-19 NOTE — Progress Notes (Signed)
Name: Christie Mcdonald   MRN: [id number]    DOB: 06/02/1961   Date:09/19/2017       Progress Note  Subjective  Chief Complaint  Chief Complaint  Patient presents with  . Hypertension  . Hyperlipidemia  . Medication Refill  . Depression    HPI  HTN: she is taking medication daily for bp, no side effects. No chest pain , but when she skipped a dose of Atenolol she felt palpitation. BP was very high today, not common for her, she states she felt nervous before came in and upset when she got her weight. 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 Changes have been, father diet April 2018, husband lost his job for 2 months ( May through June because he refused to have a drug test) she is worried about life b being too short, she states most relatives died in the past 10 years. She is worried about having a medical problems but does not want to know, states she had to go through with her parents and it upsets her.   Obesity: discussed life style modification, she is upset about her weight, not being very active.   Vitamin D and B12 deficiency: she is taking supplementation, but forgets to take B12 supplementation.    Patient Active Problem List   Diagnosis Date Noted  . Moderate recurrent major depression (HCC) 09/19/2017  . Gastro-esophageal reflux disease without esophagitis 09/20/2016  . Menopause 09/20/2016  . Temporomandibular joint-pain-dysfunction syndrome 09/20/2016  . Hyperglycemia 09/20/2016  . Obesity (BMI 30.0-34.9)  12/22/2015  . Vitamin D deficiency 12/22/2015  . Hypertension 04/23/2015  . Hyperlipemia 04/23/2015    Past Surgical History:  Procedure Laterality Date  . ABDOMINAL HYSTERECTOMY  1991  . BREAST BIOPSY Left 1997   negative  . HEMORRHOID SURGERY  1988   Dr Lorelee New    Family History  Problem Relation Age of Onset  . Breast cancer Paternal Grandfather   . Cancer Paternal Grandfather        Breast  . Diabetes Mother   . Alzheimer's disease Mother   . Heart disease Mother        CHF  . Stroke Mother   . Dementia Mother   . Stroke Father   . Kidney disease Father   . Parkinson's disease Father   . Diabetes Maternal Grandmother   . Hypertension Sister   . Diabetes Sister   . Breast cancer Cousin     Social History   Socioeconomic History  . Marital status: Married    Spouse name: Not on file  . Number of children: Not on file  . Years of education: Not on file  . Highest education level: Not on file  Social Needs  . Financial resource strain: Not on file  . Food insecurity - worry: Not on file  . Food insecurity - inability: Not on file  . Transportation needs - medical: Not on file  . Transportation needs - non-medical: Not on file  Occupational History  . Not  on file  Tobacco Use  . Smoking status: Former Smoker    Last attempt to quit: 06/26/2014    Years since quitting: 3.2  . Smokeless tobacco: Never Used  Substance and Sexual Activity  . Alcohol use: No    Alcohol/week: 0.0 oz  . Drug use: No  . Sexual activity: Not on file  Other Topics Concern  . Not on file  Social History Narrative  . Not on file     Current Outpatient Medications:  .  atenolol (TENORMIN) 25 MG tablet, TAKE 1 TABLET BY MOUTH EVERY DAY, Disp: 90 tablet, Rfl: 0 .  Cholecalciferol (VITAMIN D3) 5000 UNITS CAPS, Take 1 capsule by mouth daily., Disp: , Rfl:  .  Cyanocobalamin (B-12 SL), Place 1 drop under the tongue daily., Disp: , Rfl:  .  fluticasone (FLONASE) 50 MCG/ACT  nasal spray, PLACE 2 SPRAYS INTO BOTH NOSTRILS DAILY., Disp: 16 g, Rfl: 3 .  rosuvastatin (CRESTOR) 5 MG tablet, Take 1 tablet (5 mg total) by mouth daily. (Patient taking differently: Take 5 mg by mouth once a week. ), Disp: 30 tablet, Rfl: 4 .  amLODipine-valsartan (EXFORGE) 5-160 MG tablet, Take 1 tablet daily by mouth., Disp: 30 tablet, Rfl: 0  Allergies  Allergen Reactions  . Hydrochlorothiazide Other (See Comments)  . Lisinopril Cough     ROS  Constitutional: Negative for fever , positive for mild weight change.  Respiratory: Negative for cough and shortness of breath.   Cardiovascular: Negative for chest pain or palpitations.  Gastrointestinal: Negative for abdominal pain, no bowel changes.  Musculoskeletal: Negative for gait problem or joint swelling.  Skin: Negative for rash.  Neurological: Negative for dizziness or headache.  No other specific complaints in a complete review of systems (except as listed in HPI above).   Objective  Vitals:   09/19/17 1155 09/19/17 1203  BP: (!) 160/110 (!) 150/100  Pulse: 86   Resp: 14   SpO2: 98%   Weight: 169 lb 11.2 oz (77 kg)   Height: 5\' 1"  (1.549 m)     Body mass index is 32.06 kg/m.  Physical Exam  Constitutional: Patient appears well-developed and well-nourished. Obese No distress.  HEENT: head atraumatic, normocephalic, pupils equal and reactive to light,neck supple, throat within normal limits Cardiovascular: Normal rate, regular rhythm and normal heart sounds.  No murmur heard. No BLE edema. Pulmonary/Chest: Effort normal and breath sounds normal. No respiratory distress. Abdominal: Soft.  There is no tenderness. Psychiatric: Patient has a normal mood and affect. behavior is normal. Judgment and thought content normal.    PHQ2/9: Depression screen [facility] 2/9 09/19/2017 03/16/2017 09/20/2016 03/16/2016 12/22/2015  Decreased Interest 0 0 0 0 0  Down, Depressed, Hopeless 0 0 0 0 0  PHQ - 2 Score 0 0 0 0 0  Altered  sleeping 0 - - - -  Tired, decreased energy 2 - - - -  Change in appetite 0 - - - -  Feeling bad or failure about yourself  0 - - - -  Trouble concentrating 0 - - - -  Moving slowly or fidgety/restless 0 - - - -  Suicidal thoughts 0 - - - -  PHQ-9 Score 2 - - - -  Difficult doing work/chores Not difficult at all - - - -     Fall Risk: Fall Risk  09/19/2017 03/16/2017 09/20/2016 03/16/2016 12/22/2015  Falls in the past year? No No No No No     Assessment & Plan  1. Moderate  recurrent major depression (HCC)  Refused medication at this time  2. GAD (generalized anxiety disorder)  Anxious but does not want medication   3. Mixed hyperlipidemia  - Lipid panel  4. Vitamin D deficiency  - VITAMIN D 25 Hydroxy (Vit-D Deficiency, Fractures)  3. Vitamin B12 deficiency  - Vitamin B12  6. Hyperglycemia  - Hemoglobin A1c  7. Obesity (BMI 30.0-34.9)  Discussed with the patient the risk posed by an increased BMI. Discussed importance of portion control, calorie counting and at least 150 minutes of physical activity weekly. Avoid sweet beverages and drink more water. Eat at least 6 servings of fruit and vegetables daily   8. Uncontrolled hypertension  - COMPLETE METABOLIC PANEL WITH GFR - CBC with Differential/Platelet - TSH - amLODipine-valsartan (EXFORGE) 5-160 MG tablet; Take 1 tablet daily by mouth.  Dispense: 30 tablet; Refill: 0  9. Grieving  She would like to see hospice before she starts taking medication for depression

## 2017-09-20 DIAGNOSIS — I10 Essential (primary) hypertension: Secondary | ICD-10-CM | POA: Diagnosis not present

## 2017-09-20 DIAGNOSIS — E782 Mixed hyperlipidemia: Secondary | ICD-10-CM | POA: Diagnosis not present

## 2017-09-20 DIAGNOSIS — E538 Deficiency of other specified B group vitamins: Secondary | ICD-10-CM | POA: Diagnosis not present

## 2017-09-20 DIAGNOSIS — R739 Hyperglycemia, unspecified: Secondary | ICD-10-CM | POA: Diagnosis not present

## 2017-09-21 LAB — CBC WITH DIFFERENTIAL/PLATELET
BASOS PCT: 1.3 %
Basophils Absolute: 68 cells/uL (ref 0–200)
Eosinophils Absolute: 177 cells/uL (ref 15–500)
Eosinophils Relative: 3.4 %
HEMATOCRIT: 39.8 % (ref 35.0–45.0)
Hemoglobin: 13.1 g/dL (ref 11.7–15.5)
LYMPHS ABS: 1030 {cells}/uL (ref 850–3900)
MCH: 29.9 pg (ref 27.0–33.0)
MCHC: 32.9 g/dL (ref 32.0–36.0)
MCV: 90.9 fL (ref 80.0–100.0)
MPV: 11.1 fL (ref 7.5–12.5)
Monocytes Relative: 7.3 %
NEUTROS PCT: 68.2 %
Neutro Abs: 3546 cells/uL (ref 1500–7800)
Platelets: 310 10*3/uL (ref 140–400)
RBC: 4.38 10*6/uL (ref 3.80–5.10)
RDW: 13.2 % (ref 11.0–15.0)
Total Lymphocyte: 19.8 %
WBC: 5.2 10*3/uL (ref 3.8–10.8)
WBCMIX: 380 {cells}/uL (ref 200–950)

## 2017-09-21 LAB — HEMOGLOBIN A1C
EAG (MMOL/L): 7 (calc)
Hgb A1c MFr Bld: 6 % of total Hgb — ABNORMAL HIGH (ref <5.7)
Mean Plasma Glucose: 126 (calc)

## 2017-09-21 LAB — VITAMIN D 25 HYDROXY (VIT D DEFICIENCY, FRACTURES): Vit D, 25-Hydroxy: 86 ng/mL (ref 30–100)

## 2017-09-21 LAB — LIPID PANEL
CHOL/HDL RATIO: 5.7 (calc) — AB (ref <5.0)
Cholesterol: 223 mg/dL — ABNORMAL HIGH (ref <200)
HDL: 39 mg/dL — ABNORMAL LOW (ref >50)
LDL Cholesterol (Calc): 149 mg/dL (calc) — ABNORMAL HIGH
Non-HDL Cholesterol (Calc): 184 mg/dL (calc) — ABNORMAL HIGH (ref <130)
Triglycerides: 213 mg/dL — ABNORMAL HIGH (ref <150)

## 2017-09-21 LAB — COMPLETE METABOLIC PANEL WITH GFR
AG Ratio: 1.6 (calc) (ref 1.0–2.5)
ALT: 14 U/L (ref 6–29)
AST: 16 U/L (ref 10–35)
Albumin: 4.6 g/dL (ref 3.6–5.1)
Alkaline phosphatase (APISO): 80 U/L (ref 33–130)
BUN: 15 mg/dL (ref 7–25)
CALCIUM: 9.3 mg/dL (ref 8.6–10.4)
CO2: 31 mmol/L (ref 20–32)
CREATININE: 0.6 mg/dL (ref 0.50–1.05)
Chloride: 99 mmol/L (ref 98–110)
GFR, EST NON AFRICAN AMERICAN: 101 mL/min/{1.73_m2} (ref >=60)
GFR, Est African American: 117 mL/min/{1.73_m2} (ref >=60)
GLUCOSE: 99 mg/dL (ref 65–99)
Globulin: 2.9 g/dL (calc) (ref 1.9–3.7)
Potassium: 4.5 mmol/L (ref 3.5–5.3)
Sodium: 138 mmol/L (ref 135–146)
TOTAL PROTEIN: 7.5 g/dL (ref 6.1–8.1)
Total Bilirubin: 0.4 mg/dL (ref 0.2–1.2)

## 2017-09-21 LAB — VITAMIN B12: VITAMIN B 12: 708 pg/mL (ref 200–1100)

## 2017-09-21 LAB — TSH: TSH: 2.82 m[IU]/L (ref 0.40–4.50)

## 2017-10-05 ENCOUNTER — Other Ambulatory Visit: Payer: Self-pay | Admitting: Family Medicine

## 2017-10-05 DIAGNOSIS — I10 Essential (primary) hypertension: Secondary | ICD-10-CM

## 2017-10-05 NOTE — Telephone Encounter (Signed)
Hypertension medication request: Amlodipine-Valsartan to [facility].  Last office visit pertaining to hypertension: 09/19/2017  BP Readings from Last 3 Encounters:  09/19/17 (!) 150/100  03/16/17 128/74  09/20/16 (!) 124/58    Lab Results  Component Value Date   CREATININE 0.60 09/20/2017   BUN 15 09/20/2017   NA 138 09/20/2017   K 4.5 09/20/2017   CL 99 09/20/2017   CO2 31 09/20/2017    Follow up on 10/20/2017

## 2017-10-20 ENCOUNTER — Ambulatory Visit: Payer: BLUE CROSS/BLUE SHIELD | Admitting: Family Medicine

## 2017-10-20 ENCOUNTER — Encounter: Payer: Self-pay | Admitting: Family Medicine

## 2017-10-20 VITALS — BP 120/70 | HR 78 | Resp 14 | Ht 61.0 in | Wt 170.3 lb

## 2017-10-20 DIAGNOSIS — I10 Essential (primary) hypertension: Secondary | ICD-10-CM | POA: Diagnosis not present

## 2017-10-20 MED ORDER — AMLODIPINE BESYLATE-VALSARTAN 5-160 MG PO TABS: 1.0000 | ORAL_TABLET | Freq: Every day | ORAL | 0 refills | Status: DC

## 2017-10-20 NOTE — Patient Instructions (Signed)
Call back if bp goes below 100/70 - and you get dizzy Or if stays above 140/90

## 2017-10-20 NOTE — Progress Notes (Signed)
Name: Christie Mcdonald   MRN: [id number]    DOB: October 20, 1961   Date:10/20/2017       Progress Note  Subjective  Chief Complaint  Chief Complaint  Patient presents with  . Hypertension    HPI  HTN: she is taking medication as prescribed, denies chest pain, headaches, palpitation, she feels like she has been a little too sleepy during the day, we will decrease dose of Atenolol and switch Exforge to pm to see if helps with symptoms.  Dyslipidemia , she resumed crestor since last labs.   Patient Active Problem List   Diagnosis Date Noted  . Moderate recurrent major depression (HCC) 09/19/2017  . Gastro-esophageal reflux disease without esophagitis 09/20/2016  . Menopause 09/20/2016  . Temporomandibular joint-pain-dysfunction syndrome 09/20/2016  . Hyperglycemia 09/20/2016  . Obesity (BMI 30.0-34.9) 12/22/2015  . Vitamin D deficiency 12/22/2015  . Hypertension 04/23/2015  . Hyperlipemia 04/23/2015    Social History   Tobacco Use  . Smoking status: Former Smoker    Last attempt to quit: 06/26/2014    Years since quitting: 3.3  . Smokeless tobacco: Never Used  Substance Use Topics  . Alcohol use: No    Alcohol/week: 0.0 oz     Current Outpatient Medications:  .  amLODipine-valsartan (EXFORGE) 5-160 MG tablet, Take 1 tablet by mouth daily., Disp: 90 tablet, Rfl: 0 .  atenolol (TENORMIN) 25 MG tablet, TAKE 1 TABLET BY MOUTH EVERY DAY, Disp: 90 tablet, Rfl: 0 .  Cholecalciferol (VITAMIN D3) 5000 UNITS CAPS, Take 1 capsule by mouth daily., Disp: , Rfl:  .  Cyanocobalamin (B-12 SL), Place 1 drop under the tongue daily., Disp: , Rfl:  .  fluticasone (FLONASE) 50 MCG/ACT nasal spray, PLACE 2 SPRAYS INTO BOTH NOSTRILS DAILY., Disp: 16 g, Rfl: 3 .  rosuvastatin (CRESTOR) 5 MG tablet, Take 1 tablet (5 mg total) by mouth daily. (Patient taking differently: Take 5 mg by mouth once a week. ), Disp: 30 tablet, Rfl: 4  Allergies  Allergen Reactions  . Hydrochlorothiazide Other (See  Comments)  . Lisinopril Cough    ROS  Ten systems reviewed and is negative except as mentioned in HPI   Objective  Vitals:   10/20/17 1106  BP: 120/70  Pulse: 78  Resp: 14  SpO2: 97%  Weight: 170 lb 4.8 oz (77.2 kg)  Height: 5\' 1"  (1.549 m)    Body mass index is 32.18 kg/m.    Physical Exam  Constitutional: Patient appears well-developed and well-nourished. Obese  No distress.  HEENT: head atraumatic, normocephalic, pupils equal and reactive to light,neck supple, throat within normal limits Cardiovascular: Normal rate, regular rhythm and normal heart sounds.  No murmur heard. No BLE edema. Pulmonary/Chest: Effort normal and breath sounds normal. No respiratory distress. Abdominal: Soft.  There is no tenderness. Psychiatric: Patient has a normal mood and affect. behavior is normal. Judgment and thought content normal.  Recent Results (from the past 2160 hour(s))  Lipid panel     Status: Abnormal   Collection Time: 09/20/17  8:30 AM  Result Value Ref Range   Cholesterol 223 (H) <200 mg/dL   HDL 39 (L) >40 mg/dL   Triglycerides 981 (H) <150 mg/dL   LDL Cholesterol (Calc) 149 (H) mg/dL (calc)    Comment: Reference range: <100 . Desirable range <100 mg/dL for primary prevention;   <70 mg/dL for patients with CHD or diabetic patients  with > or = 2 CHD risk factors. Marland Kitchen LDL-C is now calculated using the Martin-Hopkins  calculation, which is a validated novel method providing  better accuracy than the Friedewald equation in the  estimation of LDL-C.  Horald PollenMartin SS et al. Lenox AhrJAMA. 5784;696(292013;310(19): 2061-2068  (http://education.[facility].com/faq/FAQ164)    Total CHOL/HDL Ratio 5.7 (H) <5.0 (calc)   Non-HDL Cholesterol (Calc) 184 (H) <130 mg/dL (calc)    Comment: For patients with diabetes plus 1 major ASCVD risk  factor, treating to a non-HDL-C goal of <100 mg/dL  (LDL-C of <52<70 mg/dL) is considered a therapeutic  option.   COMPLETE METABOLIC PANEL WITH GFR     Status:  None   Collection Time: 09/20/17  8:30 AM  Result Value Ref Range   Glucose, Bld 99 65 - 99 mg/dL    Comment: .            Fasting reference interval .    BUN 15 7 - 25 mg/dL   Creat 8.410.60 3.240.50 - 4.011.05 mg/dL    Comment: For patients >57 years of age, the reference limit for Creatinine is approximately 13% higher for people identified as African-American. .    GFR, Est Non African American 101 > OR = 60 mL/min/1.6973m2   GFR, Est African American 117 > OR = 60 mL/min/1.5773m2   BUN/Creatinine Ratio NOT APPLICABLE 6 - 22 (calc)   Sodium 138 135 - 146 mmol/L   Potassium 4.5 3.5 - 5.3 mmol/L   Chloride 99 98 - 110 mmol/L   CO2 31 20 - 32 mmol/L   Calcium 9.3 8.6 - 10.4 mg/dL   Total Protein 7.5 6.1 - 8.1 g/dL   Albumin 4.6 3.6 - 5.1 g/dL   Globulin 2.9 1.9 - 3.7 g/dL (calc)   AG Ratio 1.6 1.0 - 2.5 (calc)   Total Bilirubin 0.4 0.2 - 1.2 mg/dL   Alkaline phosphatase (APISO) 80 33 - 130 U/L   AST 16 10 - 35 U/L   ALT 14 6 - 29 U/L  CBC with Differential/Platelet     Status: None   Collection Time: 09/20/17  8:30 AM  Result Value Ref Range   WBC 5.2 3.8 - 10.8 Thousand/uL   RBC 4.38 3.80 - 5.10 Million/uL   Hemoglobin 13.1 11.7 - 15.5 g/dL   HCT 02.739.8 25.335.0 - 66.445.0 %   MCV 90.9 80.0 - 100.0 fL   MCH 29.9 27.0 - 33.0 pg   MCHC 32.9 32.0 - 36.0 g/dL   RDW 40.313.2 47.411.0 - 25.915.0 %   Platelets 310 140 - 400 Thousand/uL   MPV 11.1 7.5 - 12.5 fL   Neutro Abs 3,546 1,500 - 7,800 cells/uL   Lymphs Abs 1,030 850 - 3,900 cells/uL   WBC mixed population 380 200 - 950 cells/uL   Eosinophils Absolute 177 15 - 500 cells/uL   Basophils Absolute 68 0 - 200 cells/uL   Neutrophils Relative % 68.2 %   Total Lymphocyte 19.8 %   Monocytes Relative 7.3 %   Eosinophils Relative 3.4 %   Basophils Relative 1.3 %  Hemoglobin A1c     Status: Abnormal   Collection Time: 09/20/17  8:30 AM  Result Value Ref Range   Hgb A1c MFr Bld 6.0 (H) <5.7 % of total Hgb    Comment: For someone without known diabetes, a  hemoglobin  A1c value between 5.7% and 6.4% is consistent with prediabetes and should be confirmed with a  follow-up test. . For someone with known diabetes, a value <7% indicates that their diabetes is well controlled. A1c targets should be individualized based on duration  of diabetes, age, comorbid conditions, and other considerations. . This assay result is consistent with an increased risk of diabetes. . Currently, no consensus exists regarding use of hemoglobin A1c for diagnosis of diabetes for children. .    Mean Plasma Glucose 126 (calc)   eAG (mmol/L) 7.0 (calc)  Vitamin B12     Status: None   Collection Time: 09/20/17  8:30 AM  Result Value Ref Range   Vitamin B-12 708 200 - 1,100 pg/mL  VITAMIN D 25 Hydroxy (Vit-D Deficiency, Fractures)     Status: None   Collection Time: 09/20/17  8:30 AM  Result Value Ref Range   Vit D, 25-Hydroxy 86 30 - 100 ng/mL    Comment: Vitamin D Status         25-OH Vitamin D: . Deficiency:                    <20 ng/mL Insufficiency:             20 - 29 ng/mL Optimal:                 > or = 30 ng/mL . For 25-OH Vitamin D testing on patients on  D2-supplementation and patients for whom quantitation  of D2 and D3 fractions is required, the [facility](TM) 25-OH VIT D, (D2,D3), LC/MS/MS is recommended: order  code [id number] (patients >373yrs). . For more information on this test, go to: http://education.[facility].com/faq/FAQ163 (This link is being provided for  informational/educational purposes only.)   TSH     Status: None   Collection Time: 09/20/17  8:30 AM  Result Value Ref Range   TSH 2.82 0.40 - 4.50 mIU/L     Assessment & Plan.  1. Essential hypertension  She will go down to half pill of Atenolol qhs, and switch Exforge to qhs because it is making her feel tired during the day.  - amLODipine-valsartan (EXFORGE) 5-160 MG tablet; Take 1 tablet by mouth daily.  Dispense: 90 tablet; Refill: 0

## 2017-12-11 ENCOUNTER — Other Ambulatory Visit: Payer: Self-pay | Admitting: Family Medicine

## 2017-12-15 ENCOUNTER — Other Ambulatory Visit: Payer: Self-pay | Admitting: Family Medicine

## 2017-12-15 DIAGNOSIS — I10 Essential (primary) hypertension: Secondary | ICD-10-CM

## 2017-12-15 NOTE — Telephone Encounter (Signed)
Hypertension medication request: [facility] to [facility].   Last office visit pertaining to hypertension: 10/20/2017  BP Readings from Last 3 Encounters:  10/20/17 120/70  09/19/17 (!) 150/100  03/16/17 128/74    Lab Results  Component Value Date   CREATININE 0.60 09/20/2017   BUN 15 09/20/2017   NA 138 09/20/2017   K 4.5 09/20/2017   CL 99 09/20/2017   CO2 31 09/20/2017    Follow up 01/22/2018

## 2018-01-16 ENCOUNTER — Encounter: Payer: Self-pay | Admitting: Family Medicine

## 2018-01-16 ENCOUNTER — Ambulatory Visit: Payer: BLUE CROSS/BLUE SHIELD | Admitting: Family Medicine

## 2018-01-16 VITALS — BP 130/60 | HR 88 | Resp 14 | Ht 61.0 in | Wt 168.1 lb

## 2018-01-16 DIAGNOSIS — E669 Obesity, unspecified: Secondary | ICD-10-CM | POA: Diagnosis not present

## 2018-01-16 DIAGNOSIS — F331 Major depressive disorder, recurrent, moderate: Secondary | ICD-10-CM | POA: Diagnosis not present

## 2018-01-16 DIAGNOSIS — R011 Cardiac murmur, unspecified: Secondary | ICD-10-CM | POA: Diagnosis not present

## 2018-01-16 DIAGNOSIS — I10 Essential (primary) hypertension: Secondary | ICD-10-CM

## 2018-01-16 DIAGNOSIS — E785 Hyperlipidemia, unspecified: Secondary | ICD-10-CM

## 2018-01-16 DIAGNOSIS — F411 Generalized anxiety disorder: Secondary | ICD-10-CM

## 2018-01-16 MED ORDER — COQ-10 100 MG PO CPCR: 100.0000 mg | ORAL_CAPSULE | Freq: Every day | ORAL | 0 refills | Status: DC

## 2018-01-16 MED ORDER — AMLODIPINE BESYLATE-VALSARTAN 5-160 MG PO TABS: 1.0000 | ORAL_TABLET | Freq: Every day | ORAL | 0 refills | Status: DC

## 2018-01-16 NOTE — Progress Notes (Addendum)
Name: Christie HoughCamille Z Mcdonald   MRN: [id number]    DOB: September 30, 1961   Date:01/16/2018       Progress Note  Subjective  Chief Complaint  Chief Complaint  Patient presents with  . Medication Refill    3 month F/U  . Hypertension  . Hyperlipidemia  . Depression    HPI  HTN: she is taking medicationdaily for bp, no side effects. No chest pain , but when she skipped a dose of Atenolol she felt palpitation. BP is back to normal, she is taking half dose of Atenolol at night and Exforge in am, she states she feels tired at lunch but doing well otherwise. 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 Changes have been, father died April 2018, husband lost his job for 2 months but he is back at work. Doing better financially. They also sold her father's farm and less responsibility associated with that. Her brother in law was diagnosed with lung cancer last year and they are helping him with his meals. She states she also has symptoms of OCD. Discussed medication but she wants to hold off for now. She is afraid of taking medication because of weight gain - in the past she gained 30 lbs on Zoloft , she thinks she lost weight on Prozac but wants to hold off for now.   Obesity: discussed life style modification, she is upset about her weight, not being very active, but lost 2 lbs since last visit.   Vitamin D and B12 deficiency: she is taking supplementation, but forgets to take B12 supplementation.   Dyslipidemia: she is taking Crestor but only a couple times a week. Discussed every other day  and try CoQ 10   Patient Active Problem List   Diagnosis Date Noted  . Moderate recurrent major depression (HCC) 09/19/2017  . Gastro-esophageal reflux disease without esophagitis 09/20/2016  . Menopause 09/20/2016  . Temporomandibular joint-pain-dysfunction syndrome 09/20/2016  . Hyperglycemia 09/20/2016  . Obesity (BMI 30.0-34.9) 12/22/2015  . Vitamin D deficiency 12/22/2015  . Hypertension 04/23/2015  . Hyperlipemia 04/23/2015    Past Surgical History:  Procedure Laterality Date  . ABDOMINAL HYSTERECTOMY  1991  . BREAST BIOPSY Left 1997   negative  . COLONOSCOPY WITH PROPOFOL N/A 06/10/2015   Procedure: COLONOSCOPY WITH PROPOFOL;  Surgeon: Kieth BrightlySeeplaputhur G Sankar, MD;  Location: [facility] ENDOSCOPY;  Service: Endoscopy;  Laterality: N/A;  . HEMORRHOID SURGERY  1988   Dr Lorelee Newimothy Davis    Family History  Problem Relation Age of Onset  . Breast cancer Paternal Grandfather   . Cancer Paternal Grandfather        Breast  . Diabetes Mother   . Alzheimer's disease Mother   . Heart disease Mother        CHF  . Stroke Mother   . Dementia Mother   . Stroke Father   . Kidney disease Father   . Parkinson's disease Father   . Diabetes Maternal Grandmother   . Hypertension Sister   . Diabetes Sister   . Breast cancer Cousin     Social History  Socioeconomic History  . Marital status: Married    Spouse name: Not on file  . Number of children: Not on file  . Years of education: Not on file  . Highest education level: Not on file  Social Needs  . Financial resource strain: Not on file  . Food insecurity - worry: Not on file  . Food insecurity - inability: Not on file  . Transportation needs - medical: Not on file  . Transportation needs - non-medical: Not on file  Occupational History  . Not on file  Tobacco Use  . Smoking status: Former Smoker    Last attempt to quit: 06/26/2014    Years since quitting: 3.5  . Smokeless tobacco: Never Used  Substance and Sexual Activity   . Alcohol use: No    Alcohol/week: 0.0 oz  . Drug use: No  . Sexual activity: Not on file  Other Topics Concern  . Not on file  Social History Narrative  . Not on file     Current Outpatient Medications:  .  amLODipine-valsartan (EXFORGE) 5-160 MG tablet, Take 1 tablet by mouth daily., Disp: 90 tablet, Rfl: 0 .  atenolol (TENORMIN) 25 MG tablet, TAKE 1 TABLET BY MOUTH EVERY DAY, Disp: 90 tablet, Rfl: 0 .  Cholecalciferol (VITAMIN D3) 5000 UNITS CAPS, Take 1 capsule by mouth daily., Disp: , Rfl:  .  Cyanocobalamin (B-12 SL), Place 1 drop under the tongue daily., Disp: , Rfl:  .  rosuvastatin (CRESTOR) 5 MG tablet, Take 1 tablet (5 mg total) by mouth daily. (Patient taking differently: Take 5 mg by mouth once a week. ), Disp: 30 tablet, Rfl: 4 .  Coenzyme Q10 (COQ-10) 100 MG capsule, Take 1 capsule (100 mg total) by mouth daily., Disp: 30 capsule, Rfl: 0  Allergies  Allergen Reactions  . Hydrochlorothiazide Other (See Comments)  . Lisinopril Cough     ROS  Constitutional: Negative for fever or weight change.  Respiratory: Negative for cough and shortness of breath.   Cardiovascular: Negative for chest pain or palpitations.  Gastrointestinal: Negative for abdominal pain, no bowel changes.  Musculoskeletal: Negative for gait problem or joint swelling.  Skin: Negative for rash.  Neurological: Negative for dizziness or headache.  No other specific complaints in a complete review of systems (except as listed in HPI above).  Objective  Vitals:   01/16/18 1127  BP: 130/60  Pulse: 88  Resp: 14  SpO2: 98%  Weight: 168 lb 1.6 oz (76.2 kg)  Height: 5\' 1"  (1.549 m)    Body mass index is 31.76 kg/m.  Physical Exam  Constitutional: Patient appears well-developed and well-nourished. Obese  No distress.  HEENT: head atraumatic, normocephalic, pupils equal and reactive to light,  neck supple, throat within normal limits Cardiovascular: Normal rate, regular rhythm and opening  valve click  1 plus systolic ejection  murmur heard. No BLE edema. Pulmonary/Chest: Effort normal and breath sounds normal. No respiratory distress. Abdominal: Soft.  There is no tenderness. Psychiatric: Patient has a normal mood and affect. behavior is normal. Judgment and thought content normal.  PHQ2/9: Depression screen [facility] 2/9 01/16/2018 09/19/2017 03/16/2017 09/20/2016 03/16/2016  Decreased Interest 0 0 0 0 0  Down, Depressed, Hopeless 0 0 0 0 0  PHQ - 2 Score 0 0 0 0 0  Altered sleeping - 0 - - -  Tired, decreased energy - 2 - - -  Change in appetite - 0 - - -  Feeling bad or failure about yourself  -  0 - - -  Trouble concentrating - 0 - - -  Moving slowly or fidgety/restless 0 0 - - -  Suicidal thoughts - 0 - - -  PHQ-9 Score - 2 - - -  Difficult doing work/chores Not difficult at all Not difficult at all - - -     Fall Risk: Fall Risk  01/16/2018 10/20/2017 09/19/2017 03/16/2017 09/20/2016  Falls in the past year? No No No No No     Functional Status Survey: Is the patient deaf or have difficulty hearing?: No Does the patient have difficulty seeing, even when wearing glasses/contacts?: No Does the patient have difficulty concentrating, remembering, or making decisions?: No Does the patient have difficulty walking or climbing stairs?: No Does the patient have difficulty dressing or bathing?: No Does the patient have difficulty doing errands alone such as visiting a doctor's office or shopping?: No    Assessment & Plan  1. Moderate recurrent major depression (HCC)  She is doing much better, long history of depression, anxiety and ocd, discussed considering going back on medication, she will research about it  2. Essential hypertension  - amLODipine-valsartan (EXFORGE) 5-160 MG tablet; Take 1 tablet by mouth daily.  Dispense: 90 tablet; Refill: 0  3. Obesity (BMI 30.0-34.9)  Discussed with the patient the risk posed by an increased BMI. Discussed importance of  portion control, calorie counting and at least 150 minutes of physical activity weekly. Avoid sweet beverages and drink more water. Eat at least 6 servings of fruit and vegetables daily   4. GAD (generalized anxiety disorder)   5. Dyslipidemia (high LDL; low HDL)  Try resuming Crestor every other day.  - Coenzyme Q10 (COQ-10) 100 MG capsule; Take 1 capsule (100 mg total) by mouth daily.  Dispense: 30 capsule; Refill: 0   6. Systolic ejection murmur  Discussed with patient, we will hold off on echo at this time, very mild

## 2018-01-16 NOTE — Patient Instructions (Signed)

## 2018-02-06 ENCOUNTER — Other Ambulatory Visit: Payer: Self-pay | Admitting: Family Medicine

## 2018-02-06 NOTE — Telephone Encounter (Signed)
Refill request for general medication: Breo-Ellipita  Last office visit: 01/16/2018  Last physical exam: 12/22/2015  Follow-ups on file. 04/24/2018

## 2018-02-07 ENCOUNTER — Other Ambulatory Visit: Payer: Self-pay | Admitting: Family Medicine

## 2018-02-07 NOTE — Telephone Encounter (Signed)
Refill request for general medication. [facility]  Last office visit: 01/16/2018   Follow up 04/24/2018

## 2018-03-17 ENCOUNTER — Other Ambulatory Visit: Payer: Self-pay | Admitting: Family Medicine

## 2018-03-17 DIAGNOSIS — I10 Essential (primary) hypertension: Secondary | ICD-10-CM

## 2018-04-24 ENCOUNTER — Encounter: Payer: Self-pay | Admitting: Family Medicine

## 2018-04-24 ENCOUNTER — Ambulatory Visit: Payer: BLUE CROSS/BLUE SHIELD | Admitting: Family Medicine

## 2018-04-24 VITALS — BP 136/82 | HR 86 | Temp 98.2°F | Resp 16 | Ht 61.0 in | Wt 170.9 lb

## 2018-04-24 DIAGNOSIS — F411 Generalized anxiety disorder: Secondary | ICD-10-CM | POA: Diagnosis not present

## 2018-04-24 DIAGNOSIS — E785 Hyperlipidemia, unspecified: Secondary | ICD-10-CM

## 2018-04-24 DIAGNOSIS — E559 Vitamin D deficiency, unspecified: Secondary | ICD-10-CM

## 2018-04-24 DIAGNOSIS — M79662 Pain in left lower leg: Secondary | ICD-10-CM | POA: Diagnosis not present

## 2018-04-24 DIAGNOSIS — G44209 Tension-type headache, unspecified, not intractable: Secondary | ICD-10-CM | POA: Diagnosis not present

## 2018-04-24 DIAGNOSIS — E538 Deficiency of other specified B group vitamins: Secondary | ICD-10-CM | POA: Diagnosis not present

## 2018-04-24 DIAGNOSIS — R011 Cardiac murmur, unspecified: Secondary | ICD-10-CM | POA: Diagnosis not present

## 2018-04-24 DIAGNOSIS — F331 Major depressive disorder, recurrent, moderate: Secondary | ICD-10-CM

## 2018-04-24 DIAGNOSIS — M25551 Pain in right hip: Secondary | ICD-10-CM

## 2018-04-24 DIAGNOSIS — R7303 Prediabetes: Secondary | ICD-10-CM

## 2018-04-24 DIAGNOSIS — G8929 Other chronic pain: Secondary | ICD-10-CM

## 2018-04-24 DIAGNOSIS — I10 Essential (primary) hypertension: Secondary | ICD-10-CM | POA: Diagnosis not present

## 2018-04-24 DIAGNOSIS — R739 Hyperglycemia, unspecified: Secondary | ICD-10-CM

## 2018-04-24 MED ORDER — AMLODIPINE BESYLATE-VALSARTAN 5-160 MG PO TABS: 1.0000 | ORAL_TABLET | Freq: Every day | ORAL | 1 refills | Status: DC

## 2018-04-24 MED ORDER — ROSUVASTATIN CALCIUM 5 MG PO TABS: 5.0000 mg | ORAL_TABLET | ORAL | 5 refills | Status: DC

## 2018-04-24 MED ORDER — BACLOFEN 10 MG PO TABS: 10.0000 mg | ORAL_TABLET | Freq: Two times a day (BID) | ORAL | 0 refills | Status: DC | PRN

## 2018-04-24 MED ORDER — BUSPIRONE HCL 5 MG PO TABS: 5.0000 mg | ORAL_TABLET | Freq: Three times a day (TID) | ORAL | 0 refills | Status: DC

## 2018-04-24 MED ORDER — ATENOLOL 25 MG PO TABS: 12.5000 mg | ORAL_TABLET | Freq: Every day | ORAL | 1 refills | Status: DC

## 2018-04-24 NOTE — Progress Notes (Signed)
Name: Christie Mcdonald   MRN: [id number]    DOB: 11/14/1960   Date:04/24/2018       Progress Note  Subjective  Chief Complaint  Chief Complaint  Patient presents with  . Hypertension    Headaches  . Depression  . Hyperlipidemia  . Medication Refill  . Hip Pain    Onset-February 2019, right hip radiates to thigh    HPI  HTN: she is taking medicationdaily for bp, no side effects. No chest pain, bp at home has been well controlled occasionally goes up to 140/90  Hyperglycemia: she denies polydipsia , polyuria or polyphagia, last hgbA1C was 6.0%. She has changed her diet, but probably not having enough calories, eating two hard boiled egg in am, for lunch a can of tuna or some chicken with squash or zuchini, dinner is usually a salad. Started about one month ago.   Grieving: she lost her father 04/21/18lost two friends in the past month and now her brother in law is under hospice.   Depression/Anxiety: she has a long history of anxiety and also depression, states that since around July 2018 she felt much worse with no motivation or energy. Changes have been, father died April 21, 2018husband lost his job for 2 months, since Dec brother in law diagnosed with lung cancer and is now under hospice care at home. She states she is helping take care of him, but that gives her a sense of purpose and does not feel very depressed and anxiety is stable now. Overall feels better than last Summer.   Obesity: she has changed life style, but also not hungry , possibly from stress  Tension headache: she states that over the past 6 weeks she has noticed pain on nuchal area that can radiate to mid head. She states not associated with nausea or vomiting. Symptoms usually lasts from 11 am to after dinner. Described as tight and throbbing. She states resolves with Tylenol , but getting more often almost daily. Caffeine also seems to help. Not sleeping well worrying about her brother in law. Wakes up and  unable to fall back asleep   Vitamin D and B12 deficiency: she is taking supplementation, but forgets to take B12 supplementation.  Dyslipidemia: she is taking Crestor but only a couple times a week. Discussed every other day and try CoQ 10 , recheck labs  Chronic right hip pain: going on for over 6 months. Had similar symptoms many years ago and diagnosed with radiculitis. However this time pain is dull and aching, only present when active, pain starts on right outer hip and radiates down to anterior quad, at times it affects her gait because it can be intense. No pain at this time. Denies back pain, bowel or bladder incontinence.   Patient Active Problem List   Diagnosis Date Noted  . Systolic ejection murmur 01/16/2018  . Moderate recurrent major depression (HCC) 09/19/2017  . Gastro-esophageal reflux disease without esophagitis 09/20/2016  . Menopause 09/20/2016  . Temporomandibular joint-pain-dysfunction syndrome 09/20/2016  . Hyperglycemia 09/20/2016  . Obesity (BMI 30.0-34.9) 12/22/2015  . Vitamin D deficiency 12/22/2015  . Hypertension 04/23/2015  . Hyperlipemia 04/23/2015    Past Surgical History:  Procedure Laterality Date  . ABDOMINAL HYSTERECTOMY  1991  . BREAST BIOPSY Left 1997   negative  . COLONOSCOPY WITH PROPOFOL N/A 06/10/2015   Procedure: COLONOSCOPY WITH PROPOFOL;  Surgeon: Kieth Brightly, MD;  Location: [facility] ENDOSCOPY;  Service: Endoscopy;  Laterality: N/A;  . HEMORRHOID SURGERY  1988   Dr Lorelee New    Family History  Problem Relation Age of Onset  . Breast cancer Paternal Grandfather   . Cancer Paternal Grandfather        Breast  . Diabetes Mother   . Alzheimer's disease Mother   . Heart disease Mother        CHF  . Stroke Mother   . Dementia Mother   . Stroke Father   . Kidney disease Father   . Parkinson's disease Father   . Diabetes Maternal Grandmother   . Hypertension Sister   . Diabetes Sister   . Breast cancer Cousin      Social History   Socioeconomic History  . Marital status: Married    Spouse name: Not on file  . Number of children: Not on file  . Years of education: Not on file  . Highest education level: Not on file  Occupational History  . Not on file  Social Needs  . Financial resource strain: Not on file  . Food insecurity:    Worry: Not on file    Inability: Not on file  . Transportation needs:    Medical: Not on file    Non-medical: Not on file  Tobacco Use  . Smoking status: Former Smoker    Last attempt to quit: 06/26/2014    Years since quitting: 3.8  . Smokeless tobacco: Never Used  Substance and Sexual Activity  . Alcohol use: No    Alcohol/week: 0.0 oz  . Drug use: No  . Sexual activity: Not on file  Lifestyle  . Physical activity:    Days per week: Not on file    Minutes per session: Not on file  . Stress: Not on file  Relationships  . Social connections:    Talks on phone: Not on file    Gets together: Not on file    Attends religious service: Not on file    Active member of club or organization: Not on file    Attends meetings of clubs or organizations: Not on file    Relationship status: Not on file  . Intimate partner violence:    Fear of current or ex partner: Not on file    Emotionally abused: Not on file    Physically abused: Not on file    Forced sexual activity: Not on file  Other Topics Concern  . Not on file  Social History Narrative  . Not on file     Current Outpatient Medications:  .  amLODipine-valsartan (EXFORGE) 5-160 MG tablet, Take 1 tablet by mouth daily., Disp: 90 tablet, Rfl: 1 .  atenolol (TENORMIN) 25 MG tablet, Take 0.5 tablets (12.5 mg total) by mouth daily., Disp: 45 tablet, Rfl: 1 .  Cholecalciferol (VITAMIN D3) 5000 UNITS CAPS, Take 1 capsule by mouth daily., Disp: , Rfl:  .  Cyanocobalamin (B-12 SL), Place 1 drop under the tongue daily., Disp: , Rfl:  .  fluticasone (FLONASE) 50 MCG/ACT nasal spray, SPRAY 2 SPRAYS INTO EACH  NOSTRIL EVERY DAY, Disp: 16 g, Rfl: 3 .  [START ON 04/26/2018] rosuvastatin (CRESTOR) 5 MG tablet, Take 1 tablet (5 mg total) by mouth 2 (two) times a week., Disp: 8 tablet, Rfl: 5 .  baclofen (LIORESAL) 10 MG tablet, Take 1 tablet (10 mg total) by mouth 2 (two) times daily as needed for muscle spasms. Tension headache/neck pain, Disp: 30 each, Rfl: 0 .  Coenzyme Q10 (COQ-10) 100 MG capsule, Take 1 capsule (100 mg  total) by mouth daily. (Patient not taking: Reported on 04/24/2018), Disp: 30 capsule, Rfl: 0  Allergies  Allergen Reactions  . Hydrochlorothiazide Other (See Comments)  . Lisinopril Cough     ROS  Constitutional: Negative for fever or weight change.  Respiratory: Negative for cough and shortness of breath.   Cardiovascular: Negative for chest pain or palpitations.  Gastrointestinal: Negative for abdominal pain, no bowel changes.  Musculoskeletal: positive  for gait problem but no  joint swelling.  Skin: Negative for rash.  Neurological: Negative for dizziness , positive for mild tension headache on nuchal area .  No other specific complaints in a complete review of systems (except as listed in HPI above).   Objective  Vitals:   04/24/18 1145  BP: 136/82  Pulse: 86  Resp: 16  Temp: 98.2 F (36.8 C)  TempSrc: Oral  SpO2: 94%  Weight: 170 lb 14.4 oz (77.5 kg)    Body mass index is 32.29 kg/m.  Physical Exam  Constitutional: Patient appears well-developed and well-nourished. Obese  No distress.  HEENT: head atraumatic, normocephalic, pupils equal and reactive to light, neck supple, throat within normal limits Cardiovascular: Normal rate, regular rhythm and normal heart sounds.  Systolic ejection  murmur heard. No BLE edema.  Pulmonary/Chest: Effort normal and breath sounds normal. No respiratory distress. Abdominal: Soft.  There is no tenderness. Muscular skeletal: normal hip exam, negative straight leg raise, normal rom of back, crepitus with extension of right  knee. Calves looks and no pain during compression  Psychiatric: Patient has a normal mood and affect. behavior is normal. Judgment and thought content normal.  PHQ2/9: Depression screen [facility] 2/9 04/24/2018 01/16/2018 09/19/2017 03/16/2017 09/20/2016  Decreased Interest 0 0 0 0 0  Down, Depressed, Hopeless 0 0 0 0 0  PHQ - 2 Score 0 0 0 0 0  Altered sleeping 2 - 0 - -  Tired, decreased energy 3 - 2 - -  Change in appetite 3 - 0 - -  Feeling bad or failure about yourself  0 - 0 - -  Trouble concentrating 0 - 0 - -  Moving slowly or fidgety/restless 0 0 0 - -  Suicidal thoughts 0 - 0 - -  PHQ-9 Score 8 - 2 - -  Difficult doing work/chores Somewhat difficult Not difficult at all Not difficult at all - -     GAD 7 : Generalized Anxiety Score 04/24/2018 09/19/2017  Nervous, Anxious, on Edge 2 2  Control/stop worrying 2 1  Worry too much - different things 0 3  Trouble relaxing 0 0  Restless 0 0  Easily annoyed or irritable 0 2  Afraid - awful might happen 0 3  Total GAD 7 Score 4 11  Anxiety Difficulty Not difficult at all Very difficult      Fall Risk: Fall Risk  04/24/2018 01/16/2018 10/20/2017 09/19/2017 03/16/2017  Falls in the past year? No No No No No     Functional Status Survey: Is the patient deaf or have difficulty hearing?: No Does the patient have difficulty seeing, even when wearing glasses/contacts?: No Does the patient have difficulty concentrating, remembering, or making decisions?: No Does the patient have difficulty walking or climbing stairs?: No Does the patient have difficulty dressing or bathing?: No Does the patient have difficulty doing errands alone such as visiting a doctor's office or shopping?: No   Assessment & Plan  1. Moderate recurrent major depression (HCC)  He does not want to take medication at this time.  She is worried about brother-in-law that is under hospice care for lung cancer  2. Essential hypertension  - amLODipine-valsartan  (EXFORGE) 5-160 MG tablet; Take 1 tablet by mouth daily.  Dispense: 90 tablet; Refill: 1 - atenolol (TENORMIN) 25 MG tablet; Take 0.5 tablets (12.5 mg total) by mouth daily.  Dispense: 45 tablet; Refill: 1 - COMPLETE METABOLIC PANEL WITH GFR - CBC with Differential/Platelet  3. Dyslipidemia (high LDL; low HDL)  - rosuvastatin (CRESTOR) 5 MG tablet; Take 1 tablet (5 mg total) by mouth 2 (two) times a week.  Dispense: 8 tablet; Refill: 5 - Lipid panel  4. GAD (generalized anxiety disorder)  Discussed buspar but she refuses medication that can cause weight gain and she is willing to try it    5. Vitamin D deficiency  Continue supplementation  6. Systolic ejection murmur   7. Morbid obesity (HCC)  Based on BMI plus hyperlipidemia, HTN, pre-diabetes Discussed with the patient the risk posed by an increased BMI. Discussed importance of portion control, calorie counting and at least 150 minutes of physical activity weekly. Avoid sweet beverages and drink more water. Eat at least 6 servings of fruit and vegetables daily    8. Chronic right hip pain  -refer to Ortho   9. B12 deficiency  Continue supplementation   10. Pain of left calf  Felt a tearing sensation two months ago, resolved but occasionally feels sore, no swelling or redness, no previous history of DVT  11. Tension headache  - baclofen (LIORESAL) 10 MG tablet; Take 1 tablet (10 mg total) by mouth 2 (two) times daily as needed for muscle spasms. Tension headache/neck pain  Dispense: 30 each; Refill: 0  12. Pre-diabetes  - Hemoglobin A1c  13. Hyperglycemia  - Hemoglobin A1c

## 2018-06-09 ENCOUNTER — Other Ambulatory Visit: Payer: Self-pay | Admitting: Family Medicine

## 2018-06-12 ENCOUNTER — Ambulatory Visit: Payer: BLUE CROSS/BLUE SHIELD | Admitting: Family Medicine

## 2018-07-10 ENCOUNTER — Encounter: Payer: Self-pay | Admitting: Family Medicine

## 2018-07-10 ENCOUNTER — Ambulatory Visit: Payer: BLUE CROSS/BLUE SHIELD | Admitting: Family Medicine

## 2018-07-10 VITALS — BP 124/80 | HR 89 | Temp 98.6°F | Resp 16 | Ht 61.0 in | Wt 171.9 lb

## 2018-07-10 DIAGNOSIS — F330 Major depressive disorder, recurrent, mild: Secondary | ICD-10-CM | POA: Diagnosis not present

## 2018-07-10 DIAGNOSIS — M25551 Pain in right hip: Secondary | ICD-10-CM | POA: Diagnosis not present

## 2018-07-10 DIAGNOSIS — G44209 Tension-type headache, unspecified, not intractable: Secondary | ICD-10-CM

## 2018-07-10 DIAGNOSIS — R739 Hyperglycemia, unspecified: Secondary | ICD-10-CM | POA: Diagnosis not present

## 2018-07-10 DIAGNOSIS — M62838 Other muscle spasm: Secondary | ICD-10-CM

## 2018-07-10 DIAGNOSIS — E538 Deficiency of other specified B group vitamins: Secondary | ICD-10-CM

## 2018-07-10 DIAGNOSIS — E785 Hyperlipidemia, unspecified: Secondary | ICD-10-CM | POA: Diagnosis not present

## 2018-07-10 DIAGNOSIS — E559 Vitamin D deficiency, unspecified: Secondary | ICD-10-CM

## 2018-07-10 DIAGNOSIS — G8929 Other chronic pain: Secondary | ICD-10-CM

## 2018-07-10 DIAGNOSIS — Z23 Encounter for immunization: Secondary | ICD-10-CM

## 2018-07-10 DIAGNOSIS — F411 Generalized anxiety disorder: Secondary | ICD-10-CM

## 2018-07-10 DIAGNOSIS — Z79899 Other long term (current) drug therapy: Secondary | ICD-10-CM | POA: Diagnosis not present

## 2018-07-10 DIAGNOSIS — R011 Cardiac murmur, unspecified: Secondary | ICD-10-CM

## 2018-07-10 DIAGNOSIS — I10 Essential (primary) hypertension: Secondary | ICD-10-CM

## 2018-07-10 DIAGNOSIS — R7303 Prediabetes: Secondary | ICD-10-CM | POA: Diagnosis not present

## 2018-07-10 MED ORDER — BACLOFEN 10 MG PO TABS: 10.0000 mg | ORAL_TABLET | Freq: Every evening | ORAL | 1 refills | Status: DC | PRN

## 2018-07-10 NOTE — Progress Notes (Signed)
Name: Christie Mcdonald   MRN: [id number]    DOB: 1961-08-24   Date:07/10/2018       Progress Note  Subjective  Chief Complaint  Chief Complaint  Patient presents with  . Follow-up    6 week f/u  . Depression  . Hip Pain  . Medication Refill    Baclofen    HPI  HTN: she is taking medicationdaily for bp, no side effects. No chest pain, bp at home only checked once since last visit and it was 143/82 - she states it was busy time with kids around, but wanted to check before she came in for a visit.   Hyperglycemia: she denies polydipsia , polyuria or polyphagia,last hgbA1C was 6.0%. She has changed her diet, trying to eat more lean protein and also vegetables, we will continue to monitor   Depression/Anxiety: she has a long history of anxiety and also depression, states that since around July 2018 she felt much worse with no motivation or energy. Changes have been, father diedApril 2018, husband lost his job for 2 months, since Dec brother in law diagnosed with lung cancer and died 2018/05/06. She is doing better overall. Husband is working. They are going to her 58 yo class reunion   Morbid obesity: she has changed life style, weight is stable, she has multiple co-morbidities and is trying to lose weight, discussed well being and healthy life style   Tension headache: it happened this Spring, but since she took a few doses of baclofen able to sleep better and headaches have improved   Vitamin D and B12 deficiency: she is taking supplementation, but forgets to take B12 supplementation.  Dyslipidemia: she is taking Crestor but only a couple times a week. Discussed every other day and try CoQ 10, she never started because of the size of pills. , recheck labs  Chronic right hip pain: was going for over  6 months but resolved after a few doses of baclofen, also states left calf cramp also improved, able to walk and go grocerie shopping on her own again   Patient Active Problem  List   Diagnosis Date Noted  . Systolic ejection murmur 01/16/2018  . Moderate recurrent major depression (HCC) 09/19/2017  . Gastro-esophageal reflux disease without esophagitis 09/20/2016  . Menopause 09/20/2016  . Temporomandibular joint-pain-dysfunction syndrome 09/20/2016  . Hyperglycemia 09/20/2016  . Obesity (BMI 30.0-34.9) 12/22/2015  . Vitamin D deficiency 12/22/2015  . Hypertension 04/23/2015  . Hyperlipemia 04/23/2015    Past Surgical History:  Procedure Laterality Date  . ABDOMINAL HYSTERECTOMY  1991  . BREAST BIOPSY Left 1997   negative  . COLONOSCOPY WITH PROPOFOL N/A 06/10/2015   Procedure: COLONOSCOPY WITH PROPOFOL;  Surgeon: Kieth Brightly, MD;  Location: [facility] ENDOSCOPY;  Service: Endoscopy;  Laterality: N/A;  . HEMORRHOID SURGERY  1988   Dr Lorelee New    Family History  Problem Relation Age of Onset  . Breast cancer Paternal Grandfather   . Cancer Paternal Grandfather        Breast  . Diabetes Mother   . Alzheimer's disease Mother   . Heart disease Mother        CHF  . Stroke Mother   . Dementia Mother   . Stroke Father   . Kidney disease Father   . Parkinson's disease Father   . Diabetes Maternal Grandmother   . Hypertension Sister   . Diabetes Sister   . Breast cancer Cousin     Social History  Socioeconomic History  . Marital status: Married    Spouse name: "Kent"  . Number of children: 2  . Years of education: Not on file  . Highest education level: 12th grade  Occupational History  . Not on file  Social Needs  . Financial resource strain: Not hard at all  . Food insecurity:    Worry: Never true    Inability: Never true  . Transportation needs:    Medical: No    Non-medical: No  Tobacco Use  . Smoking status: Former Smoker    Last attempt to quit: 06/26/2014    Years since quitting: 4.0  . Smokeless tobacco: Never Used  Substance and Sexual Activity  . Alcohol use: No    Alcohol/week: 0.0 standard drinks  . Drug use:  No  . Sexual activity: Yes    Partners: Male    Birth control/protection: None  Lifestyle  . Physical activity:    Days per week: 7 days    Minutes per session: 30 min  . Stress: Not at all  Relationships  . Social connections:    Talks on phone: More than three times a week    Gets together: Twice a week    Attends religious service: More than 4 times per year    Active member of club or organization: No    Attends meetings of clubs or organizations: Never    Relationship status: Married  . Intimate partner violence:    Fear of current or ex partner: No    Emotionally abused: No    Physically abused: No    Forced sexual activity: No  Other Topics Concern  . Not on file  Social History Narrative  . Not on file     Current Outpatient Medications:  .  amLODipine-valsartan (EXFORGE) 5-160 MG tablet, Take 1 tablet by mouth daily., Disp: 90 tablet, Rfl: 1 .  atenolol (TENORMIN) 25 MG tablet, Take 0.5 tablets (12.5 mg total) by mouth daily., Disp: 45 tablet, Rfl: 1 .  baclofen (LIORESAL) 10 MG tablet, Take 1 tablet (10 mg total) by mouth 2 (two) times daily as needed for muscle spasms. Tension headache/neck pain, Disp: 30 each, Rfl: 0 .  busPIRone (BUSPAR) 5 MG tablet, TAKE 1 TABLET BY MOUTH THREE TIMES A DAY, Disp: 60 tablet, Rfl: 0 .  Cholecalciferol (VITAMIN D3) 5000 UNITS CAPS, Take 1 capsule by mouth daily., Disp: , Rfl:  .  Cyanocobalamin (B-12 SL), Place 1 drop under the tongue daily., Disp: , Rfl:  .  fluticasone (FLONASE) 50 MCG/ACT nasal spray, SPRAY 2 SPRAYS INTO EACH NOSTRIL EVERY DAY, Disp: 16 g, Rfl: 3 .  Omega-3 Fatty Acids (FISH OIL) 1200 MG CAPS, Take by mouth., Disp: , Rfl:  .  rosuvastatin (CRESTOR) 5 MG tablet, Take 1 tablet (5 mg total) by mouth 2 (two) times a week., Disp: 8 tablet, Rfl: 5 .  Coenzyme Q10 (COQ-10) 100 MG capsule, Take 1 capsule (100 mg total) by mouth daily. (Patient not taking: Reported on 58/18/2019), Disp: 30 capsule, Rfl: 0  Allergies   Allergen Reactions  . Hydrochlorothiazide Other (See Comments)  . Lisinopril Cough     ROS  Constitutional: Negative for fever or weight change.  Respiratory: Negative for cough and shortness of breath.   Cardiovascular: Negative for chest pain or palpitations.  Gastrointestinal: Negative for abdominal pain, no bowel changes.  Musculoskeletal: Negative for gait problem or joint swelling.  Skin: Negative for rash.  Neurological: Negative for dizziness or headache.  No other specific complaints in a complete review of systems (except as listed in HPI above).  Objective  Vitals:   07/10/18 1452  BP: 124/80  Pulse: 89  Resp: 16  Temp: 98.6 F (37 C)  TempSrc: Oral  SpO2: 98%  Weight: 171 lb 14.4 oz (78 kg)  Height: 5\' 1"  (1.549 m)    Body mass index is 32.48 kg/m.  Physical Exam  Constitutional: Patient appears well-developed and well-nourished. Obese No distress.  HEENT: head atraumatic, normocephalic, pupils equal and reactive to light,  neck supple, throat within normal limits Cardiovascular: Normal rate, regular rhythm and normal heart sounds.  No murmur heard. No BLE edema. Pulmonary/Chest: Effort normal and breath sounds normal. No respiratory distress. Abdominal: Soft.  There is no tenderness. Psychiatric: Patient has a normal mood and affect. behavior is normal. Judgment and thought content normal.  PHQ2/9: Depression screen [facility] 2/9 07/10/2018 04/24/2018 01/16/2018 09/19/2017 03/16/2017  Decreased Interest 0 0 0 0 0  Down, Depressed, Hopeless 0 0 0 0 0  PHQ - 2 Score 0 0 0 0 0  Altered sleeping 0 2 - 0 -  Tired, decreased energy 1 3 - 2 -  Change in appetite 0 3 - 0 -  Feeling bad or failure about yourself  0 0 - 0 -  Trouble concentrating 0 0 - 0 -  Moving slowly or fidgety/restless 0 0 0 0 -  Suicidal thoughts 0 0 - 0 -  PHQ-9 Score 1 8 - 2 -  Difficult doing work/chores Not difficult at all Somewhat difficult Not difficult at all Not difficult at all -     GAD 7 : Generalized Anxiety Score 07/10/2018 04/24/2018 09/19/2017  Nervous, Anxious, on Edge 2 2 2   Control/stop worrying 0 2 1  Worry too much - different things 0 0 3  Trouble relaxing 0 0 0  Restless 0 0 0  Easily annoyed or irritable 0 0 2  Afraid - awful might happen 2 0 3  Total GAD 7 Score 4 4 11   Anxiety Difficulty Not difficult at all Not difficult at all Very difficult    Fall Risk: Fall Risk  07/10/2018 04/24/2018 01/16/2018 10/20/2017 09/19/2017  Falls in the past year? No No No No No     Functional Status Survey: Is the patient deaf or have difficulty hearing?: No Does the patient have difficulty seeing, even when wearing glasses/contacts?: No Does the patient have difficulty concentrating, remembering, or making decisions?: No Does the patient have difficulty walking or climbing stairs?: No Does the patient have difficulty dressing or bathing?: No Does the patient have difficulty doing errands alone such as visiting a doctor's office or shopping?: No    Assessment & Plan  1. Depression, major, recurrent, mild (HCC)  She never got buspar   2. GAD (generalized anxiety disorder)  Not taking buspar, but seems to be doing better, advised to try it prn only   3. Chronic right hip pain  Doing much better since she took baclofen but still has episodes of left calf and right hip pain that improves with baclofen and would like a refill.   - baclofen (LIORESAL) 10 MG tablet; Take 1 tablet (10 mg total) by mouth at bedtime as needed for muscle spasms. Tension headache/neck pain  Dispense: 90 each; Refill: 1  4. Muscle spasm  - baclofen (LIORESAL) 10 MG tablet; Take 1 tablet (10 mg total) by mouth at bedtime as needed for muscle spasms. Tension headache/neck pain  Dispense: 90 each; Refill: 1  5. Tension headache  - baclofen (LIORESAL) 10 MG tablet; Take 1 tablet (10 mg total) by mouth at bedtime as needed for muscle spasms. Tension headache/neck pain  Dispense: 90  each; Refill: 1  6. Essential hypertension  At goal   7. Dyslipidemia (high LDL; low HDL)  Recheck labs today  8. B12 deficiency  - Vitamin B12  9. Vitamin D deficiency  - VITAMIN D 25 Hydroxy (Vit-D Deficiency, Fractures)  10. Morbid obesity (HCC)  Discussed with the patient the risk posed by an increased BMI. Discussed importance of portion control, calorie counting and at least 150 minutes of physical activity weekly. Avoid sweet beverages and drink more water. Eat at least 6 servings of fruit and vegetables daily   11. Systolic ejection murmur   12. Needs flu shot  refused

## 2018-07-11 LAB — LIPID PANEL
CHOL/HDL RATIO: 4.8 (calc) (ref <5.0)
Cholesterol: 182 mg/dL (ref <200)
HDL: 38 mg/dL — ABNORMAL LOW (ref >50)
LDL Cholesterol (Calc): 116 mg/dL (calc) — ABNORMAL HIGH
NON-HDL CHOLESTEROL (CALC): 144 mg/dL — AB (ref <130)
Triglycerides: 166 mg/dL — ABNORMAL HIGH (ref <150)

## 2018-07-11 LAB — COMPLETE METABOLIC PANEL WITH GFR
AG Ratio: 1.5 (calc) (ref 1.0–2.5)
ALT: 16 U/L (ref 6–29)
AST: 19 U/L (ref 10–35)
Albumin: 4.6 g/dL (ref 3.6–5.1)
Alkaline phosphatase (APISO): 85 U/L (ref 33–130)
BUN: 13 mg/dL (ref 7–25)
CALCIUM: 9.6 mg/dL (ref 8.6–10.4)
CHLORIDE: 97 mmol/L — AB (ref 98–110)
CO2: 31 mmol/L (ref 20–32)
Creat: 0.83 mg/dL (ref 0.50–1.05)
GFR, EST AFRICAN AMERICAN: 91 mL/min/{1.73_m2} (ref >=60)
GFR, EST NON AFRICAN AMERICAN: 78 mL/min/{1.73_m2} (ref >=60)
GLUCOSE: 93 mg/dL (ref 65–139)
Globulin: 3 g/dL (calc) (ref 1.9–3.7)
Potassium: 4.4 mmol/L (ref 3.5–5.3)
Sodium: 137 mmol/L (ref 135–146)
TOTAL PROTEIN: 7.6 g/dL (ref 6.1–8.1)
Total Bilirubin: 0.3 mg/dL (ref 0.2–1.2)

## 2018-07-11 LAB — CBC WITH DIFFERENTIAL/PLATELET
BASOS ABS: 62 {cells}/uL (ref 0–200)
BASOS PCT: 0.9 %
EOS ABS: 242 {cells}/uL (ref 15–500)
EOS PCT: 3.5 %
HEMATOCRIT: 40.4 % (ref 35.0–45.0)
HEMOGLOBIN: 13.4 g/dL (ref 11.7–15.5)
LYMPHS ABS: 1014 {cells}/uL (ref 850–3900)
MCH: 29.7 pg (ref 27.0–33.0)
MCHC: 33.2 g/dL (ref 32.0–36.0)
MCV: 89.6 fL (ref 80.0–100.0)
MONOS PCT: 6.4 %
MPV: 11 fL (ref 7.5–12.5)
Neutro Abs: 5141 cells/uL (ref 1500–7800)
Neutrophils Relative %: 74.5 %
Platelets: 329 10*3/uL (ref 140–400)
RBC: 4.51 10*6/uL (ref 3.80–5.10)
RDW: 13.6 % (ref 11.0–15.0)
Total Lymphocyte: 14.7 %
WBC mixed population: 442 cells/uL (ref 200–950)
WBC: 6.9 10*3/uL (ref 3.8–10.8)

## 2018-07-11 LAB — VITAMIN D 25 HYDROXY (VIT D DEFICIENCY, FRACTURES): Vit D, 25-Hydroxy: 79 ng/mL (ref 30–100)

## 2018-07-11 LAB — VITAMIN B12: Vitamin B-12: 947 pg/mL (ref 200–1100)

## 2018-07-11 LAB — HEMOGLOBIN A1C W/OUT EAG: Hgb A1c MFr Bld: 6.2 % of total Hgb — ABNORMAL HIGH (ref <5.7)

## 2018-08-17 ENCOUNTER — Encounter: Payer: Self-pay | Admitting: Family Medicine

## 2018-08-17 DIAGNOSIS — E538 Deficiency of other specified B group vitamins: Secondary | ICD-10-CM | POA: Insufficient documentation

## 2018-08-17 DIAGNOSIS — R7989 Other specified abnormal findings of blood chemistry: Secondary | ICD-10-CM | POA: Insufficient documentation

## 2018-09-17 DIAGNOSIS — H5203 Hypermetropia, bilateral: Secondary | ICD-10-CM | POA: Diagnosis not present

## 2018-10-11 ENCOUNTER — Ambulatory Visit: Payer: BLUE CROSS/BLUE SHIELD | Admitting: Family Medicine

## 2018-10-16 ENCOUNTER — Encounter: Payer: Self-pay | Admitting: Family Medicine

## 2018-10-16 ENCOUNTER — Ambulatory Visit: Payer: BLUE CROSS/BLUE SHIELD | Admitting: Family Medicine

## 2018-10-16 VITALS — BP 140/86 | HR 94 | Temp 98.4°F | Resp 16 | Ht 61.0 in | Wt 175.1 lb

## 2018-10-16 DIAGNOSIS — I10 Essential (primary) hypertension: Secondary | ICD-10-CM

## 2018-10-16 DIAGNOSIS — F411 Generalized anxiety disorder: Secondary | ICD-10-CM

## 2018-10-16 DIAGNOSIS — J3089 Other allergic rhinitis: Secondary | ICD-10-CM

## 2018-10-16 DIAGNOSIS — E559 Vitamin D deficiency, unspecified: Secondary | ICD-10-CM

## 2018-10-16 DIAGNOSIS — J302 Other seasonal allergic rhinitis: Secondary | ICD-10-CM

## 2018-10-16 DIAGNOSIS — E538 Deficiency of other specified B group vitamins: Secondary | ICD-10-CM | POA: Diagnosis not present

## 2018-10-16 DIAGNOSIS — F330 Major depressive disorder, recurrent, mild: Secondary | ICD-10-CM

## 2018-10-16 DIAGNOSIS — R7303 Prediabetes: Secondary | ICD-10-CM

## 2018-10-16 MED ORDER — FLUTICASONE PROPIONATE 50 MCG/ACT NA SUSP: NASAL | 5 refills | Status: DC

## 2018-10-16 MED ORDER — AMLODIPINE BESYLATE-VALSARTAN 5-160 MG PO TABS: 1.0000 | ORAL_TABLET | Freq: Every day | ORAL | 1 refills | Status: DC

## 2018-10-16 NOTE — Progress Notes (Signed)
Name: Christie Mcdonald   MRN: [id number]    DOB: Mar 16, 1961   Date:10/16/2018       Progress Note  Subjective  Chief Complaint  Chief Complaint  Patient presents with  . Depression  . Anxiety    HPI  HTN: she is taking medicationdaily for bp, bp at home has been 130's/80's. No chest pain or palpitation.   Hyperglycemia: she denies polydipsia , polyuria or polyphagia,last hgbA1C was 6.2%. She has changed her diet, trying to eat more lean protein and also vegetables, we will continue to monitor  Unchanged   Depression/Anxiety: she has a long history of anxiety and also depression, states that since around [id number] felt much worse withno motivation or energy. Changes have been, father diedApril 2018, husband lost his job for 2 months, since Dec brother in law diagnosed with lung cancer and died 04-22-18. She is doing better overall. Husband is working. They are going to her 58 yo class reunion   Obesity :she has changed life style, mild weight gain since last visit, she has multiple co-morbidities and needs to lose weight.   Vitamin D and B12 deficiency: she is taking supplementation, more compliant now   Dyslipidemia: she is taking Crestor but only a couple times a week. Discussed every other day and try CoQ 10, she never started because of the size of pills. reviewed last labs The 10-year ASCVD risk score Denman George DC Montez Hageman., et al., 2013) is: 5.3%   Values used to calculate the score:     Age: 57 years     Sex: Female     Is Non-Hispanic African American: No     Diabetic: No     Tobacco smoker: No     Systolic Blood Pressure: 140 mmHg     Is BP treated: Yes     HDL Cholesterol: 38 mg/dL     Total Cholesterol: 182 mg/dL  Patient Active Problem List   Diagnosis Date Noted  . Low vitamin B12 level 08/17/2018  . Systolic ejection murmur 01/16/2018  . Moderate recurrent major depression (HCC) 09/19/2017  . Gastro-esophageal reflux disease without esophagitis  09/20/2016  . Menopause 09/20/2016  . Temporomandibular joint-pain-dysfunction syndrome 09/20/2016  . Hyperglycemia 09/20/2016  . Obesity (BMI 30.0-34.9) 12/22/2015  . Vitamin D deficiency 12/22/2015  . Hypertension 04/23/2015  . Hyperlipemia 04/23/2015    Past Surgical History:  Procedure Laterality Date  . ABDOMINAL HYSTERECTOMY  1991  . BREAST BIOPSY Left 1997   negative  . COLONOSCOPY WITH PROPOFOL N/A 06/10/2015   Procedure: COLONOSCOPY WITH PROPOFOL;  Surgeon: Kieth Brightly, MD;  Location: [facility] ENDOSCOPY;  Service: Endoscopy;  Laterality: N/A;  . HEMORRHOID SURGERY  1988   Dr Lorelee New    Family History  Problem Relation Age of Onset  . Breast cancer Paternal Grandfather   . Cancer Paternal Grandfather        Breast  . Diabetes Mother   . Alzheimer's disease Mother   . Heart disease Mother        CHF  . Stroke Mother   . Dementia Mother   . Stroke Father   . Kidney disease Father   . Parkinson's disease Father   . Diabetes Maternal Grandmother   . Hypertension Sister   . Diabetes Sister   . Breast cancer Cousin     Social History   Socioeconomic History  . Marital status: Married    Spouse name: "Kent"  . Number of children: 2  . Years of  education: Not on file  . Highest education level: 12th grade  Occupational History  . Not on file  Social Needs  . Financial resource strain: Not hard at all  . Food insecurity:    Worry: Never true    Inability: Never true  . Transportation needs:    Medical: No    Non-medical: No  Tobacco Use  . Smoking status: Former Smoker    Last attempt to quit: 06/26/2014    Years since quitting: 4.3  . Smokeless tobacco: Never Used  Substance and Sexual Activity  . Alcohol use: No    Alcohol/week: 0.0 standard drinks  . Drug use: No  . Sexual activity: Yes    Partners: Male    Birth control/protection: None  Lifestyle  . Physical activity:    Days per week: 7 days    Minutes per session: 30 min  .  Stress: Not at all  Relationships  . Social connections:    Talks on phone: More than three times a week    Gets together: Twice a week    Attends religious service: More than 4 times per year    Active member of club or organization: No    Attends meetings of clubs or organizations: Never    Relationship status: Married  . Intimate partner violence:    Fear of current or ex partner: No    Emotionally abused: No    Physically abused: No    Forced sexual activity: No  Other Topics Concern  . Not on file  Social History Narrative  . Not on file     Current Outpatient Medications:  .  amLODipine-valsartan (EXFORGE) 5-160 MG tablet, Take 1 tablet by mouth daily., Disp: 90 tablet, Rfl: 1 .  atenolol (TENORMIN) 25 MG tablet, Take 0.5 tablets (12.5 mg total) by mouth daily., Disp: 45 tablet, Rfl: 1 .  baclofen (LIORESAL) 10 MG tablet, Take 1 tablet (10 mg total) by mouth at bedtime as needed for muscle spasms. Tension headache/neck pain, Disp: 90 each, Rfl: 1 .  Cholecalciferol (VITAMIN D3) 5000 UNITS CAPS, Take 1 capsule by mouth daily., Disp: , Rfl:  .  Cyanocobalamin (B-12 SL), Place 1 drop under the tongue daily., Disp: , Rfl:  .  fluticasone (FLONASE) 50 MCG/ACT nasal spray, SPRAY 2 SPRAYS INTO EACH NOSTRIL EVERY DAY, Disp: 16 g, Rfl: 3 .  Omega-3 Fatty Acids (FISH OIL) 1200 MG CAPS, Take by mouth., Disp: , Rfl:  .  rosuvastatin (CRESTOR) 5 MG tablet, Take 1 tablet (5 mg total) by mouth 2 (two) times a week., Disp: 8 tablet, Rfl: 5  Allergies  Allergen Reactions  . Hydrochlorothiazide Other (See Comments)  . Lisinopril Cough    I personally reviewed active problem list, medication list, allergies, family history, social history with the patient/caregiver today.   ROS  Constitutional: Negative for fever or significant  weight change.  Respiratory: Negative for cough and shortness of breath.   Cardiovascular: Negative for chest pain or palpitations.  Gastrointestinal:  Negative for abdominal pain, no bowel changes.  Musculoskeletal: Negative for gait problem or joint swelling.  Skin: Negative for rash.  Neurological: Negative for dizziness or headache.  No other specific complaints in a complete review of systems (except as listed in HPI above).  Objective  Vitals:   10/16/18 0950  BP: 140/86  Pulse: 94  Resp: 16  Temp: 98.4 F (36.9 C)  TempSrc: Oral  SpO2: 96%  Weight: 175 lb 1.6 oz (79.4 kg)  Height: 5\' 1"  (1.549 m)    Body mass index is 33.08 kg/m.  Physical Exam  Constitutional: Patient appears well-developed and well-nourished. Obese  No distress.  HEENT: head atraumatic, normocephalic, pupils equal and reactive to light, neck supple, throat within normal limits Cardiovascular: Normal rate, regular rhythm and normal heart sounds.  No murmur heard. No BLE edema. Pulmonary/Chest: Effort normal and breath sounds normal. No respiratory distress. Abdominal: Soft.  There is no tenderness. Psychiatric: Patient has a normal mood and affect. behavior is normal. Judgment and thought content normal.  PHQ2/9: [facility] 10/16/2018 07/10/2018 04/24/2018 01/16/2018 09/19/2017  Decreased Interest 0 0 0 0 0  Down, Depressed, Hopeless 0 0 0 0 0  PHQ - 2 Score 0 0 0 0 0  Altered sleeping 1 0 2 - 0  Tired, decreased energy 1 1 3  - 2  Change in appetite 0 0 3 - 0  Feeling bad or failure about yourself  0 0 0 - 0  Trouble concentrating 0 0 0 - 0  Moving slowly or fidgety/restless 0 0 0 0 0  Suicidal thoughts 0 0 0 - 0  PHQ-9 Score 2 1 8  - 2  Difficult doing work/chores Not difficult at all Not difficult at all Somewhat difficult Not difficult at all Not difficult at all     Fall Risk: Fall Risk  10/16/2018 07/10/2018 04/24/2018 01/16/2018 10/20/2017  Falls in the past year? 0 No No No No     Assessment & Plan  1. Essential hypertension  - amLODipine-valsartan (EXFORGE) 5-160 MG tablet; Take 1 tablet by mouth daily.  Dispense: 90  tablet; Refill: 1  2. GAD (generalized anxiety disorder)  Doing better, never started on Buspar   3. Depression, major, recurrent, mild (HCC)  Doing better   4. B12 deficiency  Continue supplementation   5. Vitamin D deficiency  Continue supplementation   6. Pre-diabetes  Discussed importance of diet and exercise

## 2018-11-14 ENCOUNTER — Other Ambulatory Visit: Payer: Self-pay | Admitting: Family Medicine

## 2018-11-14 DIAGNOSIS — E785 Hyperlipidemia, unspecified: Secondary | ICD-10-CM

## 2018-12-21 ENCOUNTER — Other Ambulatory Visit: Payer: Self-pay | Admitting: Family Medicine

## 2018-12-21 DIAGNOSIS — I10 Essential (primary) hypertension: Secondary | ICD-10-CM

## 2018-12-21 NOTE — Telephone Encounter (Signed)
Refill request for Hypertension medication:  Atenolol 25 mg  Last office visit pertaining to hypertension: 10/16/2018  BP Readings from Last 3 Encounters:  10/16/18 140/86  07/10/18 124/80  04/24/18 136/82   Lab Results  Component Value Date   CREATININE 0.83 07/10/2018   BUN 13 07/10/2018   NA 137 07/10/2018   K 4.4 07/10/2018   CL 97 (L) 07/10/2018   CO2 31 07/10/2018    Follow-ups on file. 01/15/2019

## 2019-01-15 ENCOUNTER — Other Ambulatory Visit: Payer: Self-pay | Admitting: Family Medicine

## 2019-01-15 ENCOUNTER — Ambulatory Visit: Payer: BLUE CROSS/BLUE SHIELD | Admitting: Family Medicine

## 2019-01-15 ENCOUNTER — Encounter: Payer: Self-pay | Admitting: Family Medicine

## 2019-01-15 VITALS — BP 140/70 | HR 84 | Temp 98.0°F | Resp 16 | Ht 61.0 in | Wt 173.6 lb

## 2019-01-15 DIAGNOSIS — E538 Deficiency of other specified B group vitamins: Secondary | ICD-10-CM

## 2019-01-15 DIAGNOSIS — L299 Pruritus, unspecified: Secondary | ICD-10-CM

## 2019-01-15 DIAGNOSIS — E785 Hyperlipidemia, unspecified: Secondary | ICD-10-CM

## 2019-01-15 DIAGNOSIS — I10 Essential (primary) hypertension: Secondary | ICD-10-CM | POA: Diagnosis not present

## 2019-01-15 DIAGNOSIS — R7303 Prediabetes: Secondary | ICD-10-CM | POA: Diagnosis not present

## 2019-01-15 DIAGNOSIS — F411 Generalized anxiety disorder: Secondary | ICD-10-CM | POA: Diagnosis not present

## 2019-01-15 DIAGNOSIS — F325 Major depressive disorder, single episode, in full remission: Secondary | ICD-10-CM

## 2019-01-15 DIAGNOSIS — E66811 Obesity, class 1: Secondary | ICD-10-CM

## 2019-01-15 DIAGNOSIS — E559 Vitamin D deficiency, unspecified: Secondary | ICD-10-CM

## 2019-01-15 DIAGNOSIS — E669 Obesity, unspecified: Secondary | ICD-10-CM

## 2019-01-15 DIAGNOSIS — J302 Other seasonal allergic rhinitis: Secondary | ICD-10-CM

## 2019-01-15 DIAGNOSIS — J3089 Other allergic rhinitis: Secondary | ICD-10-CM

## 2019-01-15 MED ORDER — FEXOFENADINE HCL 180 MG PO TABS: 180.0000 mg | ORAL_TABLET | Freq: Every day | ORAL | 0 refills | Status: DC

## 2019-01-15 NOTE — Addendum Note (Signed)
Addended by: [name] F on: 01/15/2019 11:25 AM   Modules accepted: Orders

## 2019-01-15 NOTE — Progress Notes (Addendum)
Name: Christie Mcdonald   MRN: [id number]    DOB: [birth year]   Date:01/15/2019       Progress Note  Subjective  Chief Complaint  Chief Complaint  Patient presents with  . Medication Refill    3 month F/U  . Hypertension  . Depression  . Obesity  . Dyslipidemia  . Anxiety  . Ear Problem    Bilateral-ears have been very itchy  . Lichen planus    Having a outbreak    HPI  HTN: she is taking medicationdaily for bp, bp at home has been 130's/80's. No chest pain or palpitation. She denies dizziness   Lichen Planus: diagnosed by dermatologist 3 years ago, on arms legs and feet, raised dark spots, some flat and some raised, mild pruritis at times, uses triamcinolone that she uses prn.   Hyperglycemia: she denies polydipsia , polyuria or polyphagia,last hgbA1C was 6.2%. She has changed her diet,trying to eat more lean protein and also vegetables, we will recheck next visit   Depression/Anxiety: she has a long history of anxiety and also depression, states that since around [id number] felt much worse withno motivation or energy. Changes have been, father diedApril 2018, husband lost his job for 2 months, since Dec brother in law diagnosed with lung cancer anddied June 2019. She is being intentional about going out with friend and family. She her 73 's HS reunion, she is re-decorationg her house and is doing well  Obesity :she has changed life style,she lost 2 lbs since last visit , she has decrease portion size, eating more salads.   Vitamin D and B12 deficiency: she is taking supplementation, more compliant now   Dyslipidemia: she is taking Crestor but only a couple times a week and last LDL has improved, continue medication    Patient Active Problem List   Diagnosis Date Noted  . Low vitamin B12 level 08/17/2018  . Systolic ejection murmur 01/16/2018  . Moderate recurrent major depression (HCC) 09/19/2017  . Gastro-esophageal reflux disease without esophagitis  09/20/2016  . Menopause 09/20/2016  . Temporomandibular joint-pain-dysfunction syndrome 09/20/2016  . Hyperglycemia 09/20/2016  . Obesity (BMI 30.0-34.9) 12/22/2015  . Vitamin D deficiency 12/22/2015  . Hypertension 04/23/2015  . Hyperlipemia 04/23/2015    Past Surgical History:  Procedure Laterality Date  . ABDOMINAL HYSTERECTOMY  1991  . BREAST BIOPSY Left 1997   negative  . COLONOSCOPY WITH PROPOFOL N/A 06/10/2015   Procedure: COLONOSCOPY WITH PROPOFOL;  Surgeon: Kieth Brightly, MD;  Location: [facility] ENDOSCOPY;  Service: Endoscopy;  Laterality: N/A;  . HEMORRHOID SURGERY  1988   Dr Lorelee New    Family History  Problem Relation Age of Onset  . Breast cancer Paternal Grandfather   . Cancer Paternal Grandfather        Breast  . Diabetes Mother   . Alzheimer's disease Mother   . Heart disease Mother        CHF  . Stroke Mother   . Dementia Mother   . Stroke Father   . Kidney disease Father   . Parkinson's disease Father   . Diabetes Maternal Grandmother   . Hypertension Sister   . Diabetes Sister   . Breast cancer Cousin     Social History   Socioeconomic History  . Marital status: Married    Spouse name: "Kent"  . Number of children: 2  . Years of education: Not on file  . Highest education level: 12th grade  Occupational History  . Not on file  Social Needs  . Financial resource strain: Not hard at all  . Food insecurity:    Worry: Never true    Inability: Never true  . Transportation needs:    Medical: No    Non-medical: No  Tobacco Use  . Smoking status: Former Smoker    Last attempt to quit: 06/26/2014    Years since quitting: 4.5  . Smokeless tobacco: Never Used  Substance and Sexual Activity  . Alcohol use: No    Alcohol/week: 0.0 standard drinks  . Drug use: No  . Sexual activity: Yes    Partners: Male    Birth control/protection: None  Lifestyle  . Physical activity:    Days per week: 7 days    Minutes per session: 30 min  .  Stress: Not at all  Relationships  . Social connections:    Talks on phone: More than three times a week    Gets together: Twice a week    Attends religious service: More than 4 times per year    Active member of club or organization: No    Attends meetings of clubs or organizations: Never    Relationship status: Married  . Intimate partner violence:    Fear of current or ex partner: No    Emotionally abused: No    Physically abused: No    Forced sexual activity: No  Other Topics Concern  . Not on file  Social History Narrative  . Not on file     Current Outpatient Medications:  .  amLODipine-valsartan (EXFORGE) 5-160 MG tablet, Take 1 tablet by mouth daily., Disp: 90 tablet, Rfl: 1 .  atenolol (TENORMIN) 25 MG tablet, TAKE 0.5 TAB BY MOUTH DAILY, Disp: 45 tablet, Rfl: 1 .  baclofen (LIORESAL) 10 MG tablet, Take 1 tablet (10 mg total) by mouth at bedtime as needed for muscle spasms. Tension headache/neck pain, Disp: 90 each, Rfl: 1 .  Cholecalciferol (VITAMIN D3) 5000 UNITS CAPS, Take 1 capsule by mouth daily., Disp: , Rfl:  .  Cyanocobalamin (B-12 SL), Place 1 drop under the tongue daily., Disp: , Rfl:  .  fluticasone (FLONASE) 50 MCG/ACT nasal spray, SPRAY 2 SPRAYS INTO EACH NOSTRIL EVERY DAY, Disp: 16 g, Rfl: 5 .  rosuvastatin (CRESTOR) 5 MG tablet, TAKE 1 TABLET (5 MG TOTAL) BY MOUTH 2 (TWO) TIMES A WEEK., Disp: 24 tablet, Rfl: 1 .  Omega-3 Fatty Acids (FISH OIL) 1200 MG CAPS, Take by mouth., Disp: , Rfl:   Allergies  Allergen Reactions  . Hydrochlorothiazide Other (See Comments)  . Lisinopril Cough    I personally reviewed active problem list, medication list, allergies, family history, social history with the patient/caregiver today.   ROS  Constitutional: Negative for fever or weight change.  Respiratory: Negative for cough and shortness of breath.   Cardiovascular: Negative for chest pain or palpitations.  Gastrointestinal: Negative for abdominal pain, no bowel  changes.  Musculoskeletal: Negative for gait problem or joint swelling.  Skin: Negative for rash.  Neurological: Negative for dizziness or headache.  No other specific complaints in a complete review of systems (except as listed in HPI above).  Objective  Vitals:   01/15/19 1029 01/15/19 1102  BP: (!) 144/72 140/70  Pulse: 84   Resp: 16   Temp: 98 F (36.7 C)   TempSrc: Oral   SpO2: 98%   Weight: 173 lb 9.6 oz (78.7 kg)   Height:  (1.549 m)     Body mass index is 32.8 kg/m.  Physical Exam  Constitutional: Patient appears well-developed and well-nourished. Obese  No distress.  HEENT: head atraumatic, normocephalic, pupils equal and reactive to light, eneck supple, throat within normal limits Cardiovascular: Normal rate, regular rhythm and normal heart sounds.  No murmur heard. No BLE edema. Pulmonary/Chest: Effort normal and breath sounds normal. No respiratory distress. Abdominal: Soft.  There is no tenderness. Skin: small lesions 2-4 mm some raised and some flat, hyperpigmented, under axilla, arms and legs Psychiatric: Patient has a normal mood and affect. behavior is normal. Judgment and thought content normal.   PHQ2/9: Depression screen [facility] 2/9 01/15/2019 10/16/2018 07/10/2018 04/24/2018 01/16/2018  Decreased Interest 0 0 0 0 0  Down, Depressed, Hopeless 0 0 0 0 0  PHQ - 2 Score 0 0 0 0 0  Altered sleeping 1 1 0 2 -  Tired, decreased energy 1 1 1 3  -  Change in appetite 0 0 0 3 -  Feeling bad or failure about yourself  0 0 0 0 -  Trouble concentrating 0 0 0 0 -  Moving slowly or fidgety/restless 0 0 0 0 0  Suicidal thoughts 0 0 0 0 -  PHQ-9 Score 2 2 1 8  -  Difficult doing work/chores Not difficult at all Not difficult at all Not difficult at all Somewhat difficult Not difficult at all     Fall Risk: Fall Risk  01/15/2019 10/16/2018 07/10/2018 04/24/2018 01/16/2018  Falls in the past year? 0 0 No No No  Number falls in past yr: 0 - - - -  Injury with Fall? 0 - -  - -     Functional Status Survey: Is the patient deaf or have difficulty hearing?: No Does the patient have difficulty seeing, even when wearing glasses/contacts?: Yes Does the patient have difficulty concentrating, remembering, or making decisions?: No Does the patient have difficulty walking or climbing stairs?: No Does the patient have difficulty dressing or bathing?: No Does the patient have difficulty doing errands alone such as visiting a doctor's office or shopping?: No    Assessment & Plan  1. Depression, major, in remission [facility])  Doing well now   2. Pre-diabetes  Discussed life style modification   3. Essential hypertension  At goal today   4. GAD (generalized anxiety disorder)  Doing well at this time   5. B12 deficiency  Reviewed last labs and at goal   6. Vitamin D deficiency  Doing well on supplementation  7. Dyslipidemia (high LDL; low HDL)  On Crestor a few times a week  8. Obesity (BMI 30.0-34.9)  Discussed with the patient the risk posed by an increased BMI. Discussed importance of portion control, calorie counting and at least 150 minutes of physical activity weekly. Avoid sweet beverages and drink more water. Eat at least 6 servings of fruit and vegetables daily    9. Perennial allergic rhinitis with seasonal variation  Add Allegra   10. Ear itching  - fexofenadine (ALLEGRA ALLERGY) 180 MG tablet; Take 1 tablet (180 mg total) by mouth daily.  Dispense: 30 tablet; Refill: 0

## 2019-01-15 NOTE — Telephone Encounter (Signed)
She is requesting a 90 day refill.

## 2019-01-16 MED ORDER — FEXOFENADINE HCL 180 MG PO TABS: 180.0000 mg | ORAL_TABLET | Freq: Every day | ORAL | 1 refills | Status: DC

## 2019-01-16 NOTE — Addendum Note (Signed)
Addended by: Tommie Raymond on: 01/16/2019 08:45 AM   Modules accepted: Orders

## 2019-01-16 NOTE — Telephone Encounter (Signed)
Please resubmit to pharmacy. Electronic transmission failed and it did not send.

## 2019-03-16 ENCOUNTER — Other Ambulatory Visit: Payer: Self-pay | Admitting: Family Medicine

## 2019-03-16 DIAGNOSIS — J3089 Other allergic rhinitis: Secondary | ICD-10-CM

## 2019-03-16 DIAGNOSIS — J302 Other seasonal allergic rhinitis: Secondary | ICD-10-CM

## 2019-03-17 ENCOUNTER — Other Ambulatory Visit: Payer: Self-pay | Admitting: Family Medicine

## 2019-03-17 DIAGNOSIS — M25551 Pain in right hip: Secondary | ICD-10-CM

## 2019-03-17 DIAGNOSIS — G8929 Other chronic pain: Secondary | ICD-10-CM

## 2019-03-17 DIAGNOSIS — M62838 Other muscle spasm: Secondary | ICD-10-CM

## 2019-03-17 DIAGNOSIS — G44209 Tension-type headache, unspecified, not intractable: Secondary | ICD-10-CM

## 2019-03-21 ENCOUNTER — Other Ambulatory Visit: Payer: Self-pay | Admitting: Family Medicine

## 2019-03-21 DIAGNOSIS — I10 Essential (primary) hypertension: Secondary | ICD-10-CM

## 2019-05-07 ENCOUNTER — Encounter: Payer: Self-pay | Admitting: Family Medicine

## 2019-05-07 ENCOUNTER — Other Ambulatory Visit: Payer: Self-pay

## 2019-05-07 ENCOUNTER — Ambulatory Visit (INDEPENDENT_AMBULATORY_CARE_PROVIDER_SITE_OTHER): Payer: BC Managed Care – PPO | Admitting: Family Medicine

## 2019-05-07 VITALS — BP 118/72 | HR 87 | Temp 97.3°F | Resp 16 | Ht 61.0 in | Wt 177.1 lb

## 2019-05-07 DIAGNOSIS — R7303 Prediabetes: Secondary | ICD-10-CM

## 2019-05-07 DIAGNOSIS — E559 Vitamin D deficiency, unspecified: Secondary | ICD-10-CM

## 2019-05-07 DIAGNOSIS — I10 Essential (primary) hypertension: Secondary | ICD-10-CM | POA: Diagnosis not present

## 2019-05-07 DIAGNOSIS — E538 Deficiency of other specified B group vitamins: Secondary | ICD-10-CM

## 2019-05-07 DIAGNOSIS — F325 Major depressive disorder, single episode, in full remission: Secondary | ICD-10-CM

## 2019-05-07 DIAGNOSIS — E669 Obesity, unspecified: Secondary | ICD-10-CM

## 2019-05-07 DIAGNOSIS — F411 Generalized anxiety disorder: Secondary | ICD-10-CM

## 2019-05-07 DIAGNOSIS — E785 Hyperlipidemia, unspecified: Secondary | ICD-10-CM

## 2019-05-07 MED ORDER — AMLODIPINE BESYLATE-VALSARTAN 5-160 MG PO TABS: 1.0000 | ORAL_TABLET | Freq: Every day | ORAL | 0 refills | Status: DC

## 2019-05-07 NOTE — Progress Notes (Signed)
Name: Christie Mcdonald   MRN: [id number]    DOB: 02/20/1961   Date:05/07/2019       Progress Note  Subjective  Chief Complaint  Chief Complaint  Patient presents with  . Medication Refill  . Depression  . Hypertension    Denies any symptoms- BP average 123/80  . Dyslipidemia  . Anxiety  . Vitamin D and B12 Deficiency    HPI  HTN: she is taking medicationdaily for bp,bp at home was elevated in the 140's/80's however she switched bottle yesterday and bp is towards low end of normal with the [employer] brand. She states she also change her diet, avoiding salt, eating more raw vegetables, but she got a little dizzy yesterday while sitting on her chair at work. Advised to hold Atenolol if bp remains low   Lichen Planus: diagnosed by dermatologist 3 years ago, on arms legs and feet, raised dark spots, some flat and some raised, mild pruritis at times, she used triamcinolone and symptoms resolved    Hyperglycemia: she denies polydipsia , polyuria or polyphagia,last hgbA1C was 6.2%. She has changed in her diet, eating more raw vegetables but for a period of time tried vegetarian diet and was eating more pasta  Depression/Anxiety: she has a long history of anxiety and also depression, states that since around [id number] felt much worse withno motivation or energy. Changes have been, father diedApril 2018, husband lost his job for 2 months, since Dec brother in law diagnosed with lung cancer anddied June 2019. She is now helping with husband's parents, but they are mostly independent. She is doing well at this time   Obesity:she has changed life style,she lost 2 lbs since last visit , she has decrease portion size, eating more salads.   Vitamin D and B12 deficiency: she is taking supplementation. Unchanged   Dyslipidemia: she is taking Crestor she was taking it twice a week, but not recently  Patient Active Problem List   Diagnosis Date Noted  . Low vitamin B12  level 08/17/2018  . Systolic ejection murmur 23/76/2831  . Moderate recurrent major depression ([place]) 09/19/2017  . Gastro-esophageal reflux disease without esophagitis 09/20/2016  . Menopause 09/20/2016  . Temporomandibular joint-pain-dysfunction syndrome 09/20/2016  . Hyperglycemia 09/20/2016  . Obesity (BMI 30.0-34.9) 12/22/2015  . Vitamin D deficiency 12/22/2015  . Hypertension 04/23/2015  . Hyperlipemia 04/23/2015    Past Surgical History:  Procedure Laterality Date  . ABDOMINAL HYSTERECTOMY  1991  . BREAST BIOPSY Left 1997   negative  . COLONOSCOPY WITH PROPOFOL N/A 06/10/2015   Procedure: COLONOSCOPY WITH PROPOFOL;  Surgeon: Christene Lye, MD;  Location: [facility] ENDOSCOPY;  Service: Endoscopy;  Laterality: N/A;  . HEMORRHOID SURGERY  1988   Dr Annabell Sabal    Family History  Problem Relation Age of Onset  . Breast cancer Paternal Grandfather   . Cancer Paternal Grandfather        Breast  . Diabetes Mother   . Alzheimer's disease Mother   . Heart disease Mother        CHF  . Stroke Mother   . Dementia Mother   . Stroke Father   . Kidney disease Father   . Parkinson's disease Father   . Diabetes Maternal Grandmother   . Hypertension Sister   . Diabetes Sister   . Breast cancer Cousin     Social History   Socioeconomic History  . Marital status: Married    Spouse name: "Kent"  . Number of children: 2  .  Years of education: Not on file  . Highest education level: 12th grade  Occupational History  . Not on file  Social Needs  . Financial resource strain: Not hard at all  . Food insecurity    Worry: Never true    Inability: Never true  . Transportation needs    Medical: No    Non-medical: No  Tobacco Use  . Smoking status: Former Smoker    Quit date: 06/26/2014    Years since quitting: 4.8  . Smokeless tobacco: Never Used  Substance and Sexual Activity  . Alcohol use: No    Alcohol/week: 0.0 standard drinks  . Drug use: No  . Sexual activity:  Yes    Partners: Male    Birth control/protection: None  Lifestyle  . Physical activity    Days per week: 7 days    Minutes per session: 30 min  . Stress: Not at all  Relationships  . Social connections    Talks on phone: More than three times a week    Gets together: Twice a week    Attends religious service: More than 4 times per year    Active member of club or organization: No    Attends meetings of clubs or organizations: Never    Relationship status: Married  . Intimate partner violence    Fear of current or ex partner: No    Emotionally abused: No    Physically abused: No    Forced sexual activity: No  Other Topics Concern  . Not on file  Social History Narrative  . Not on file     Current Outpatient Medications:  .  amLODipine-valsartan (EXFORGE) 5-160 MG tablet, TAKE 1 TABLET BY MOUTH EVERY DAY, Disp: 90 tablet, Rfl: 0 .  atenolol (TENORMIN) 25 MG tablet, TAKE 0.5 TAB BY MOUTH DAILY, Disp: 45 tablet, Rfl: 1 .  baclofen (LIORESAL) 10 MG tablet, TAKE ONE TABLET BY MOUTH AT BEDTIME AS NEEDED FOR MUSCLE SPASMS, TENSION HEADACHE, NECK PAIN, Disp: 90 tablet, Rfl: 0 .  Cholecalciferol (VITAMIN D3) 5000 UNITS CAPS, Take 1 capsule by mouth daily., Disp: , Rfl:  .  Cyanocobalamin (B-12 SL), Place 1 drop under the tongue daily., Disp: , Rfl:  .  fluticasone (FLONASE) 50 MCG/ACT nasal spray, SPRAY 2 SPRAYS INTO EACH NOSTRIL EVERY DAY, Disp: 48 g, Rfl: 0 .  Omega-3 Fatty Acids (FISH OIL) 1200 MG CAPS, Take by mouth., Disp: , Rfl:  .  rosuvastatin (CRESTOR) 5 MG tablet, TAKE 1 TABLET (5 MG TOTAL) BY MOUTH 2 (TWO) TIMES A WEEK., Disp: 24 tablet, Rfl: 1 .  fexofenadine (ALLEGRA) 180 MG tablet, Take 1 tablet (180 mg total) by mouth daily. (Patient not taking: Reported on 05/07/2019), Disp: 90 tablet, Rfl: 1  Allergies  Allergen Reactions  . Hydrochlorothiazide Other (See Comments)  . Lisinopril Cough    I personally reviewed active problem list, medication list, allergies, family  history, social history with the patient/caregiver today.   ROS  Constitutional: Negative for fever, positive for  weight change.  Respiratory: Negative for cough and shortness of breath.   Cardiovascular: Negative for chest pain or palpitations.  Gastrointestinal: Negative for abdominal pain, no bowel changes.  Musculoskeletal: Negative for gait problem or joint swelling.  Skin: Negative for rash.  Neurological: Negative for dizziness or headache.  No other specific complaints in a complete review of systems (except as listed in HPI above).  Objective  Vitals:   05/07/19 1100  BP: 118/72  Pulse: 87  Resp: 16  Temp: (!) 97.3 F (36.3 C)  SpO2: 97%  Weight: 177 lb 1.6 oz (80.3 kg)  Height: 5\' 1"  (1.549 m)    Body mass index is 33.46 kg/m.  Physical Exam  Constitutional: Patient appears well-developed and well-nourished. Obese  No distress.  HEENT: head atraumatic, normocephalic, pupils equal and reactive to light,  neck supple, oral exam not done  Cardiovascular: Normal rate, regular rhythm and normal heart sounds.  No murmur heard. No BLE edema. Pulmonary/Chest: Effort normal and breath sounds normal. No respiratory distress. Abdominal: Soft.  There is no tenderness. Psychiatric: Patient has a normal mood and affect. behavior is normal. Judgment and thought content normal.  PHQ2/9: Depression screen [facility] 2/9 05/07/2019 01/15/2019 10/16/2018 07/10/2018 04/24/2018  Decreased Interest 0 0 0 0 0  Down, Depressed, Hopeless 0 0 0 0 0  PHQ - 2 Score 0 0 0 0 0  Altered sleeping 0 1 1 0 2  Tired, decreased energy 0 1 1 1 3   Change in appetite 0 0 0 0 3  Feeling bad or failure about yourself  0 0 0 0 0  Trouble concentrating 0 0 0 0 0  Moving slowly or fidgety/restless 0 0 0 0 0  Suicidal thoughts 0 0 0 0 0  PHQ-9 Score 0 2 2 1 8   Difficult doing work/chores Not difficult at all Not difficult at all Not difficult at all Not difficult at all Somewhat difficult    phq 9 is  negative   Fall Risk: Fall Risk  05/07/2019 01/15/2019 10/16/2018 07/10/2018 04/24/2018  Falls in the past year? 0 0 0 No No  Number falls in past yr: 0 0 - - -  Injury with Fall? 0 0 - - -     Functional Status Survey: Is the patient deaf or have difficulty hearing?: No Does the patient have difficulty seeing, even when wearing glasses/contacts?: Yes Does the patient have difficulty concentrating, remembering, or making decisions?: No Does the patient have difficulty walking or climbing stairs?: No Does the patient have difficulty dressing or bathing?: No Does the patient have difficulty doing errands alone such as visiting a doctor's office or shopping?: No    Assessment & Plan  1. Depression, major, in remission (HCC)  Doing well   2. Pre-diabetes  Discussed low carbohydrate diet   3. Essential hypertension  - amLODipine-valsartan (EXFORGE) 5-160 MG tablet; Take 1 tablet by mouth daily.  Dispense: 90 tablet; Refill: 0  4. GAD (generalized anxiety disorder)   5. B12 deficiency  continue supplementation   6. Vitamin D deficiency  Continue supplementation   7. Dyslipidemia (high LDL; low HDL)   8. Obesity (BMI 30.0-34.9)  Discussed with the patient the risk posed by an increased BMI. Discussed importance of portion control, calorie counting and at least 150 minutes of physical activity weekly. Avoid sweet beverages and drink more water. Eat at least 6 servings of fruit and vegetables daily

## 2019-05-07 NOTE — Patient Instructions (Signed)
Hold Atenolol is bp at night is above 130/80

## 2019-06-12 ENCOUNTER — Other Ambulatory Visit: Payer: Self-pay | Admitting: Family Medicine

## 2019-06-12 DIAGNOSIS — G44209 Tension-type headache, unspecified, not intractable: Secondary | ICD-10-CM

## 2019-06-12 DIAGNOSIS — J3089 Other allergic rhinitis: Secondary | ICD-10-CM

## 2019-06-12 DIAGNOSIS — M25551 Pain in right hip: Secondary | ICD-10-CM

## 2019-06-12 DIAGNOSIS — J302 Other seasonal allergic rhinitis: Secondary | ICD-10-CM

## 2019-06-12 DIAGNOSIS — G8929 Other chronic pain: Secondary | ICD-10-CM

## 2019-06-12 DIAGNOSIS — M62838 Other muscle spasm: Secondary | ICD-10-CM

## 2019-06-13 ENCOUNTER — Other Ambulatory Visit: Payer: Self-pay | Admitting: Family Medicine

## 2019-06-13 DIAGNOSIS — I10 Essential (primary) hypertension: Secondary | ICD-10-CM

## 2019-06-13 NOTE — Telephone Encounter (Signed)
Hypertension medication request: Atenolol to [facility]   Last office visit pertaining to hypertension: 05/07/2019   BP Readings from Last 3 Encounters:  05/07/19 118/72  01/15/19 140/70  10/16/18 140/86    Lab Results  Component Value Date   CREATININE 0.83 07/10/2018   BUN 13 07/10/2018   NA 137 07/10/2018   K 4.4 07/10/2018   CL 97 (L) 07/10/2018   CO2 31 07/10/2018     Follow up on 08/06/2019

## 2019-08-06 ENCOUNTER — Encounter: Payer: Self-pay | Admitting: Family Medicine

## 2019-08-06 ENCOUNTER — Other Ambulatory Visit: Payer: Self-pay

## 2019-08-06 ENCOUNTER — Ambulatory Visit: Payer: BC Managed Care – PPO | Admitting: Family Medicine

## 2019-08-06 VITALS — BP 130/70 | HR 80 | Temp 96.9°F | Resp 16 | Ht 61.0 in | Wt 175.4 lb

## 2019-08-06 DIAGNOSIS — F325 Major depressive disorder, single episode, in full remission: Secondary | ICD-10-CM

## 2019-08-06 DIAGNOSIS — E559 Vitamin D deficiency, unspecified: Secondary | ICD-10-CM | POA: Diagnosis not present

## 2019-08-06 DIAGNOSIS — E785 Hyperlipidemia, unspecified: Secondary | ICD-10-CM

## 2019-08-06 DIAGNOSIS — R739 Hyperglycemia, unspecified: Secondary | ICD-10-CM | POA: Diagnosis not present

## 2019-08-06 DIAGNOSIS — I10 Essential (primary) hypertension: Secondary | ICD-10-CM

## 2019-08-06 DIAGNOSIS — E538 Deficiency of other specified B group vitamins: Secondary | ICD-10-CM | POA: Diagnosis not present

## 2019-08-06 DIAGNOSIS — R7303 Prediabetes: Secondary | ICD-10-CM

## 2019-08-06 MED ORDER — AMLODIPINE BESYLATE-VALSARTAN 5-160 MG PO TABS: 1.0000 | ORAL_TABLET | Freq: Every day | ORAL | 0 refills | Status: DC

## 2019-08-06 NOTE — Progress Notes (Signed)
Name: Christie Mcdonald   MRN: [id number]    DOB: 07/29/1961   Date:08/06/2019       Progress Note  Subjective  Chief Complaint  Chief Complaint  Patient presents with  . Depression  . Hypertension  . Hyperglycemia  . Anxiety    HPI  HTN: she is taking medicationas prescribed she is back on Atenolol and Exforge, when I advised to stop the Atenolol bp was spiking on DBP and she is back on both..  She denies chest pain or palpitation, no recent dizziness. BP at home occasionally goes up to 150, most of the time 130's   Lichen Planus: diagnosed by dermatologist 3 years ago, on arms legs and feet, raised dark spots, some flat and some raised, mild pruritis at times, she used triamcinolone ,s he has one spot on her leg and will resume medication   Hyperglycemia: she denies polydipsia , polyuria or polyphagia,last hgbA1C was 6.2%. She has changed in her diet, eating more raw vegetables but for a period of time tried vegetarian diet and was eating more pasta, we will recheck levels today   Depression/Anxiety: she has a long history of anxiety and also depression, states that since around [id number] felt much worse withno motivation or energy. Changes have been, father diedApril 2018, husband lost his job for 2 months, since Dec brother in law diagnosed with lung cancer anddied June 2019.She is feeling well now, she states taking time for herself. She states her mother in law talks too much and she is the caregiver for her in laws, but is waiting for them to ask for help  Vitamin D and B12 deficiency: she is taking supplementation. Unchanged   Dyslipidemia: she is taking Crestor mostly once a week and we will recheck labs, gets joint aches when she takes it more often     Patient Active Problem List   Diagnosis Date Noted  . Low vitamin B12 level 08/17/2018  . Systolic ejection murmur 01/16/2018  . Moderate recurrent major depression (HCC) 09/19/2017  . Gastro-esophageal reflux  disease without esophagitis 09/20/2016  . Menopause 09/20/2016  . Temporomandibular joint-pain-dysfunction syndrome 09/20/2016  . Hyperglycemia 09/20/2016  . Obesity (BMI 30.0-34.9) 12/22/2015  . Vitamin D deficiency 12/22/2015  . Hypertension 04/23/2015  . Hyperlipemia 04/23/2015    Past Surgical History:  Procedure Laterality Date  . ABDOMINAL HYSTERECTOMY  1991  . BREAST BIOPSY Left 1997   negative  . COLONOSCOPY WITH PROPOFOL N/A 06/10/2015   Procedure: COLONOSCOPY WITH PROPOFOL;  Surgeon: Kieth Brightly, MD;  Location: [facility] ENDOSCOPY;  Service: Endoscopy;  Laterality: N/A;  . HEMORRHOID SURGERY  1988   Dr Lorelee New    Family History  Problem Relation Age of Onset  . Breast cancer Paternal Grandfather   . Cancer Paternal Grandfather        Breast  . Diabetes Mother   . Alzheimer's disease Mother   . Heart disease Mother        CHF  . Stroke Mother   . Dementia Mother   . Stroke Father   . Kidney disease Father   . Parkinson's disease Father   . Diabetes Maternal Grandmother   . Hypertension Sister   . Diabetes Sister   . Breast cancer Cousin     Social History   Socioeconomic History  . Marital status: Married    Spouse name: "Kent"  . Number of children: 2  . Years of education: Not on file  . Highest education level: 12th  grade  Occupational History  . Not on file  Social Needs  . Financial resource strain: Not hard at all  . Food insecurity    Worry: Never true    Inability: Never true  . Transportation needs    Medical: No    Non-medical: No  Tobacco Use  . Smoking status: Former Smoker    Quit date: 06/26/2014    Years since quitting: 5.1  . Smokeless tobacco: Never Used  Substance and Sexual Activity  . Alcohol use: No    Alcohol/week: 0.0 standard drinks  . Drug use: No  . Sexual activity: Yes    Partners: Male    Birth control/protection: None  Lifestyle  . Physical activity    Days per week: 7 days    Minutes per session:  30 min  . Stress: Not at all  Relationships  . Social connections    Talks on phone: More than three times a week    Gets together: Twice a week    Attends religious service: More than 4 times per year    Active member of club or organization: No    Attends meetings of clubs or organizations: Never    Relationship status: Married  . Intimate partner violence    Fear of current or ex partner: No    Emotionally abused: No    Physically abused: No    Forced sexual activity: No  Other Topics Concern  . Not on file  Social History Narrative  . Not on file     Current Outpatient Medications:  .  amLODipine-valsartan (EXFORGE) 5-160 MG tablet, Take 1 tablet by mouth daily., Disp: 90 tablet, Rfl: 0 .  atenolol (TENORMIN) 25 MG tablet, TAKE 0.5 TABLET BY MOUTH EVERY DAY, Disp: 45 tablet, Rfl: 0 .  baclofen (LIORESAL) 10 MG tablet, TAKE ONE TABLET BY MOUTH AT BEDTIME AS NEEDED FOR MUSCLE SPASMS, TENSION HEADACHE, NECK PAIN, Disp: 90 tablet, Rfl: 0 .  Cholecalciferol (VITAMIN D3) 5000 UNITS CAPS, Take 1 capsule by mouth daily., Disp: , Rfl:  .  Cyanocobalamin (B-12 SL), Place 1 drop under the tongue daily., Disp: , Rfl:  .  fexofenadine (ALLEGRA) 180 MG tablet, Take 1 tablet (180 mg total) by mouth daily., Disp: 90 tablet, Rfl: 1 .  fluticasone (FLONASE) 50 MCG/ACT nasal spray, SPRAY 2 SPRAYS INTO EACH NOSTRIL EVERY DAY, Disp: 48 mL, Rfl: 1 .  rosuvastatin (CRESTOR) 5 MG tablet, TAKE 1 TABLET (5 MG TOTAL) BY MOUTH 2 (TWO) TIMES A WEEK., Disp: 24 tablet, Rfl: 1 .  Omega-3 Fatty Acids (FISH OIL) 1200 MG CAPS, Take by mouth., Disp: , Rfl:   Allergies  Allergen Reactions  . Hydrochlorothiazide Other (See Comments)  . Lisinopril Cough    I personally reviewed active problem list, medication list, allergies, family history, social history, health maintenance with the patient/caregiver today.   ROS  Constitutional: Negative for fever or weight change.  Respiratory: Negative for cough and  shortness of breath.   Cardiovascular: Negative for chest pain or palpitations.  Gastrointestinal: Negative for abdominal pain, no bowel changes.  Musculoskeletal: Negative for gait problem or joint swelling.  Skin: Negative for rash.  Neurological: Negative for dizziness or headache.  No other specific complaints in a complete review of systems (except as listed in HPI above).  Objective  Vitals:   08/06/19 1103  BP: 130/70  Pulse: 80  Resp: 16  Temp: (!) 96.9 F (36.1 C)  TempSrc: Temporal  SpO2: 95%  Weight: 175  lb 6.4 oz (79.6 kg)  Height: 5\' 1"  (1.549 m)    Body mass index is 33.14 kg/m.  Physical Exam  Constitutional: Patient appears well-developed and well-nourished. Obese  No distress.  HEENT: head atraumatic, normocephalic, pupils equal and reactive to light Cardiovascular: Normal rate, regular rhythm and normal heart sounds.  No murmur heard. No BLE edema. Pulmonary/Chest: Effort normal and breath sounds normal. No respiratory distress. Abdominal: Soft.  There is no tenderness. Psychiatric: Patient has a normal mood and affect. behavior is normal. Judgment and thought content normal.  PHQ2/9: Depression screen [facility] 2/9 08/06/2019 05/07/2019 01/15/2019 10/16/2018 07/10/2018  Decreased Interest 0 0 0 0 0  Down, Depressed, Hopeless 0 0 0 0 0  PHQ - 2 Score 0 0 0 0 0  Altered sleeping 0 0 1 1 0  Tired, decreased energy 0 0 1 1 1   Change in appetite 0 0 0 0 0  Feeling bad or failure about yourself  0 0 0 0 0  Trouble concentrating 0 0 0 0 0  Moving slowly or fidgety/restless 0 0 0 0 0  Suicidal thoughts 0 0 0 0 0  PHQ-9 Score 0 0 2 2 1   Difficult doing work/chores - Not difficult at all Not difficult at all Not difficult at all Not difficult at all  Some recent data might be hidden    phq 9 is negative   Fall Risk: Fall Risk  08/06/2019 05/07/2019 01/15/2019 10/16/2018 07/10/2018  Falls in the past year? 0 0 0 0 No  Number falls in past yr: 0 0 0 - -  Injury with  Fall? 0 0 0 - -    Functional Status Survey: Is the patient deaf or have difficulty hearing?: No Does the patient have difficulty seeing, even when wearing glasses/contacts?: No Does the patient have difficulty concentrating, remembering, or making decisions?: No Does the patient have difficulty walking or climbing stairs?: No Does the patient have difficulty dressing or bathing?: No Does the patient have difficulty doing errands alone such as visiting a doctor's office or shopping?: No    Assessment & Plan  1. Depression, major, in remission ([place])  stable  2. B12 deficiency  Continue supplementation   3. Vitamin D deficiency  Continue supplementation   4. Dyslipidemia (high LDL; low HDL)  - Lipid panel  5. Essential hypertension  - COMPLETE METABOLIC PANEL WITH GFR - CBC with Differential/Platelet - amLODipine-valsartan (EXFORGE) 5-160 MG tablet; Take 1 tablet by mouth daily.  Dispense: 90 tablet; Refill: 0  6. Pre-diabetes  - Hemoglobin A1c  7. Hyperglycemia  - Hemoglobin A1c

## 2019-08-07 LAB — COMPLETE METABOLIC PANEL WITH GFR
AG Ratio: 1.6 (calc) (ref 1.0–2.5)
ALT: 17 U/L (ref 6–29)
AST: 18 U/L (ref 10–35)
Albumin: 4.6 g/dL (ref 3.6–5.1)
Alkaline phosphatase (APISO): 84 U/L (ref 37–153)
BUN: 13 mg/dL (ref 7–25)
CO2: 31 mmol/L (ref 20–32)
Calcium: 9.7 mg/dL (ref 8.6–10.4)
Chloride: 101 mmol/L (ref 98–110)
Creat: 0.65 mg/dL (ref 0.50–1.05)
GFR, Est African American: 113 mL/min/{1.73_m2} (ref >=60)
GFR, Est Non African American: 97 mL/min/{1.73_m2} (ref >=60)
Globulin: 2.8 g/dL (calc) (ref 1.9–3.7)
Glucose, Bld: 107 mg/dL — ABNORMAL HIGH (ref 65–99)
Potassium: 4.5 mmol/L (ref 3.5–5.3)
Sodium: 139 mmol/L (ref 135–146)
Total Bilirubin: 0.3 mg/dL (ref 0.2–1.2)
Total Protein: 7.4 g/dL (ref 6.1–8.1)

## 2019-08-07 LAB — LIPID PANEL
Cholesterol: 176 mg/dL (ref <200)
HDL: 33 mg/dL — ABNORMAL LOW (ref >=50)
LDL Cholesterol (Calc): 112 mg/dL (calc) — ABNORMAL HIGH
Non-HDL Cholesterol (Calc): 143 mg/dL (calc) — ABNORMAL HIGH (ref <130)
Total CHOL/HDL Ratio: 5.3 (calc) — ABNORMAL HIGH (ref <5.0)
Triglycerides: 185 mg/dL — ABNORMAL HIGH (ref <150)

## 2019-08-07 LAB — CBC WITH DIFFERENTIAL/PLATELET
Absolute Monocytes: 508 cells/uL (ref 200–950)
Basophils Absolute: 92 cells/uL (ref 0–200)
Basophils Relative: 1.2 %
Eosinophils Absolute: 277 cells/uL (ref 15–500)
Eosinophils Relative: 3.6 %
HCT: 39.6 % (ref 35.0–45.0)
Hemoglobin: 13.1 g/dL (ref 11.7–15.5)
Lymphs Abs: 1109 cells/uL (ref 850–3900)
MCH: 30.3 pg (ref 27.0–33.0)
MCHC: 33.1 g/dL (ref 32.0–36.0)
MCV: 91.7 fL (ref 80.0–100.0)
MPV: 11.1 fL (ref 7.5–12.5)
Monocytes Relative: 6.6 %
Neutro Abs: 5713 cells/uL (ref 1500–7800)
Neutrophils Relative %: 74.2 %
Platelets: 380 10*3/uL (ref 140–400)
RBC: 4.32 10*6/uL (ref 3.80–5.10)
RDW: 13.4 % (ref 11.0–15.0)
Total Lymphocyte: 14.4 %
WBC: 7.7 10*3/uL (ref 3.8–10.8)

## 2019-08-07 LAB — HEMOGLOBIN A1C
Hgb A1c MFr Bld: 6.2 % of total Hgb — ABNORMAL HIGH (ref <5.7)
Mean Plasma Glucose: 131 (calc)
eAG (mmol/L): 7.3 (calc)

## 2019-08-20 DIAGNOSIS — L280 Lichen simplex chronicus: Secondary | ICD-10-CM | POA: Diagnosis not present

## 2019-08-20 DIAGNOSIS — L408 Other psoriasis: Secondary | ICD-10-CM | POA: Diagnosis not present

## 2019-08-20 DIAGNOSIS — D485 Neoplasm of uncertain behavior of skin: Secondary | ICD-10-CM | POA: Diagnosis not present

## 2019-08-20 DIAGNOSIS — L308 Other specified dermatitis: Secondary | ICD-10-CM | POA: Diagnosis not present

## 2019-09-11 ENCOUNTER — Other Ambulatory Visit: Payer: Self-pay | Admitting: Family Medicine

## 2019-09-11 DIAGNOSIS — G44209 Tension-type headache, unspecified, not intractable: Secondary | ICD-10-CM

## 2019-09-11 DIAGNOSIS — M62838 Other muscle spasm: Secondary | ICD-10-CM

## 2019-09-11 DIAGNOSIS — G8929 Other chronic pain: Secondary | ICD-10-CM

## 2019-09-11 DIAGNOSIS — I10 Essential (primary) hypertension: Secondary | ICD-10-CM

## 2019-09-11 NOTE — Telephone Encounter (Signed)
Requested medication (s) are due for refill today: yes  Requested medication (s) are on the active medication list: yes  Last refill:  06/13/2019  Future visit scheduled: yes  Notes to clinic:  Refill cannot be delegated    Requested Prescriptions  Pending Prescriptions Disp Refills   baclofen (LIORESAL) 10 MG tablet [Pharmacy Med Name: BACLOFEN 10 MG TABLET] 90 tablet 0    Sig: TAKE ONE TABLET BY MOUTH AT BEDTIME AS NEEDED FOR MUSCLE SPASMS, TENSION HEADACHE, NECK PAIN     Not Delegated - Analgesics:  Muscle Relaxants Failed - 09/11/2019  2:46 AM      Failed - This refill cannot be delegated      Passed - Valid encounter within last 6 months    Recent Outpatient Visits          1 month ago Depression, major, in remission [facility])   [facility] [place], Drue Stager, MD   4 months ago Depression, major, in remission [facility])   [facility] [place], Drue Stager, MD   7 months ago Depression, major, in remission [facility])   [facility] Steele Sizer, MD   11 months ago Essential hypertension   [facility] Steele Sizer, MD   1 year ago Depression, major, recurrent, mild [facility])   [facility] Steele Sizer, MD      Future Appointments            In 3 months Ancil Boozer, Drue Stager, MD [facility]            atenolol (TENORMIN) 25 MG tablet [Pharmacy Med Name: ATENOLOL 25 MG TABLET] 45 tablet 0    Sig: TAKE 1/2 TABLET BY MOUTH EVERY DAY     Cardiovascular:  Beta Blockers Passed - 09/11/2019  2:46 AM      Passed - Last BP in normal range    BP Readings from Last 1 Encounters:  08/06/19 130/70         Passed - Last Heart Rate in normal range    Pulse Readings from Last 1 Encounters:  08/06/19 80         Passed - Valid encounter within last 6 months    Recent Outpatient Visits          1 month ago Depression, major, in remission [facility])   [facility]  [place], Drue Stager, MD   4 months ago Depression, major, in remission [facility])   [facility] [place], Drue Stager, MD   7 months ago Depression, major, in remission [facility])   [facility] Steele Sizer, MD   11 months ago Essential hypertension   [facility] Steele Sizer, MD   1 year ago Depression, major, recurrent, mild [facility])   [facility] Steele Sizer, MD      Future Appointments            In 3 months Ancil Boozer, Drue Stager, MD [facility]

## 2019-11-18 ENCOUNTER — Other Ambulatory Visit: Payer: Self-pay | Admitting: Family Medicine

## 2019-11-18 DIAGNOSIS — I10 Essential (primary) hypertension: Secondary | ICD-10-CM

## 2019-11-19 ENCOUNTER — Other Ambulatory Visit: Payer: Self-pay | Admitting: Family Medicine

## 2019-11-19 DIAGNOSIS — E785 Hyperlipidemia, unspecified: Secondary | ICD-10-CM

## 2019-12-09 ENCOUNTER — Other Ambulatory Visit: Payer: Self-pay | Admitting: Family Medicine

## 2019-12-09 DIAGNOSIS — I10 Essential (primary) hypertension: Secondary | ICD-10-CM

## 2019-12-10 ENCOUNTER — Encounter: Payer: Self-pay | Admitting: Family Medicine

## 2019-12-10 ENCOUNTER — Ambulatory Visit (INDEPENDENT_AMBULATORY_CARE_PROVIDER_SITE_OTHER): Payer: BC Managed Care – PPO | Admitting: Family Medicine

## 2019-12-10 ENCOUNTER — Other Ambulatory Visit: Payer: Self-pay | Admitting: Family Medicine

## 2019-12-10 VITALS — BP 132/79 | HR 78 | Temp 98.4°F | Ht 61.0 in

## 2019-12-10 DIAGNOSIS — E538 Deficiency of other specified B group vitamins: Secondary | ICD-10-CM | POA: Diagnosis not present

## 2019-12-10 DIAGNOSIS — L900 Lichen sclerosus et atrophicus: Secondary | ICD-10-CM

## 2019-12-10 DIAGNOSIS — E785 Hyperlipidemia, unspecified: Secondary | ICD-10-CM

## 2019-12-10 DIAGNOSIS — F411 Generalized anxiety disorder: Secondary | ICD-10-CM

## 2019-12-10 DIAGNOSIS — J302 Other seasonal allergic rhinitis: Secondary | ICD-10-CM

## 2019-12-10 DIAGNOSIS — R7303 Prediabetes: Secondary | ICD-10-CM

## 2019-12-10 DIAGNOSIS — F325 Major depressive disorder, single episode, in full remission: Secondary | ICD-10-CM

## 2019-12-10 DIAGNOSIS — I10 Essential (primary) hypertension: Secondary | ICD-10-CM | POA: Diagnosis not present

## 2019-12-10 DIAGNOSIS — E559 Vitamin D deficiency, unspecified: Secondary | ICD-10-CM | POA: Diagnosis not present

## 2019-12-10 MED ORDER — AMLODIPINE BESYLATE-VALSARTAN 5-160 MG PO TABS: 1.0000 | ORAL_TABLET | Freq: Every day | ORAL | 0 refills | Status: DC

## 2019-12-10 MED ORDER — HYDROXYZINE HCL 10 MG PO TABS: 10.0000 mg | ORAL_TABLET | Freq: Three times a day (TID) | ORAL | 0 refills | Status: DC | PRN

## 2019-12-10 NOTE — Progress Notes (Signed)
Name: Christie Mcdonald   MRN: [id number]    DOB: 1961/02/25   Date:12/10/2019       Progress Note  Subjective  Chief Complaint  Chief Complaint  Patient presents with  . Medication Refill  . Hypertension    Denies any symptoms  . Depression  . Anxiety  . Dyslipidemia  . Vitamin D and B12 deficiency    I connected with  Christie Mcdonald on 12/10/19 at 10:20 AM EST by telephone and verified that I am speaking with the correct person using two identifiers.  I discussed the limitations, risks, security and privacy concerns of performing an evaluation and management service by telephone and the availability of in person appointments. Staff also discussed with the patient that there may be a patient responsible charge related to this service. Patient Location: at home  Provider Location: [facility]   HPI  HTN: she is taking medicationas prescribed she is back on Atenolol and Exforge, when I advised to stop the Atenolol bp was spiking on DBP and she is back on both.She denies dizziness, chest pain or palpitation. She states she was not checking bp until this past weekend and has been in the 130's/70's-80's, she had one episode of bp spiking at 160  Lichen Planus: diagnosed by dermatologist 4years ago, on arms legs and feet, raised dark spots, some flat and some raised, mild pruritis at times,she used triamcinolone , she also developed some itching on her scalp,  using topical triamcinolone prn and seems to be helping   Hyperglycemia: she denies polydipsia , polyuria or polyphagia,last hgbA1C was stable at 6.2%.She has changed  her diet.  Depression/Anxiety: she has a long history of anxiety and also depression, states that since around [id number] felt much worse withno motivation or energy. Changes have been, father diedApril 2018, husband lost his job for 2 months, since Dec brother in law diagnosed with lung cancer anddied June 2019.She states she continues  to feel anxious all the time -usually worse at work when having too many things going at her at once, and causes irritability . She has been feeling tired, but she stopped taking caffeine completely, and feels more tired, but sleeping very well at night. Uninterrupted sleep for 4-5 hours per night.   Vitamin D and B12 deficiency: she is taking supplementation. Unchanged  Dyslipidemia: she is taking Crestor mostly once a week , last LDL was 112, advised to try taking it three times a week, HDL was low, discussed increasing fish and tree nuts, triglycerides was also high.   Patient Active Problem List   Diagnosis Date Noted  . Low vitamin B12 level 08/17/2018  . Systolic ejection murmur 01/16/2018  . Moderate recurrent major depression (HCC) 09/19/2017  . Gastro-esophageal reflux disease without esophagitis 09/20/2016  . Menopause 09/20/2016  . Temporomandibular joint-pain-dysfunction syndrome 09/20/2016  . Hyperglycemia 09/20/2016  . Obesity (BMI 30.0-34.9) 12/22/2015  . Vitamin D deficiency 12/22/2015  . Hypertension 04/23/2015  . Hyperlipemia 04/23/2015    Past Surgical History:  Procedure Laterality Date  . ABDOMINAL HYSTERECTOMY  1991  . BREAST BIOPSY Left 1997   negative  . COLONOSCOPY WITH PROPOFOL N/A 06/10/2015   Procedure: COLONOSCOPY WITH PROPOFOL;  Surgeon: Christie Brightly, MD;  Location: [facility] ENDOSCOPY;  Service: Endoscopy;  Laterality: N/A;  . HEMORRHOID SURGERY  1988   Dr Lorelee New    Family History  Problem Relation Age of Onset  . Breast cancer Paternal Grandfather   . Cancer Paternal Grandfather  Breast  . Diabetes Mother   . Alzheimer's disease Mother   . Heart disease Mother        CHF  . Stroke Mother   . Dementia Mother   . Stroke Father   . Kidney disease Father   . Parkinson's disease Father   . Diabetes Maternal Grandmother   . Hypertension Sister   . Diabetes Sister   . Breast cancer Cousin       Current Outpatient  Medications:  .  amLODipine-valsartan (EXFORGE) 5-160 MG tablet, TAKE 1 TABLET BY MOUTH EVERY DAY, Disp: 90 tablet, Rfl: 0 .  atenolol (TENORMIN) 25 MG tablet, TAKE 1/2 TABLET BY MOUTH EVERY DAY, Disp: 45 tablet, Rfl: 2 .  baclofen (LIORESAL) 10 MG tablet, TAKE ONE TABLET BY MOUTH AT BEDTIME AS NEEDED FOR MUSCLE SPASMS, TENSION HEADACHE, NECK PAIN, Disp: 90 tablet, Rfl: 0 .  Cholecalciferol (VITAMIN D3) 5000 UNITS CAPS, Take 1 capsule by mouth daily., Disp: , Rfl:  .  Cyanocobalamin (B-12 SL), Place 1 drop under the tongue daily., Disp: , Rfl:  .  fluticasone (FLONASE) 50 MCG/ACT nasal spray, SPRAY 2 SPRAYS INTO EACH NOSTRIL EVERY DAY, Disp: 48 mL, Rfl: 1 .  rosuvastatin (CRESTOR) 5 MG tablet, TAKE 1 TABLET BY MOUTH 2 TIMES A WEEK,DUE 01/19 (Patient taking differently: Take 5 mg by mouth once a week. ), Disp: 24 tablet, Rfl: 1 .  triamcinolone ointment (KENALOG) 0.1 %, APPLY ON AFFECTED AREA TWICE DAILY AS NEEDED., Disp: , Rfl:  .  fexofenadine (ALLEGRA) 180 MG tablet, Take 1 tablet (180 mg total) by mouth daily. (Patient not taking: Reported on 12/10/2019), Disp: 90 tablet, Rfl: 1 .  Omega-3 Fatty Acids (FISH OIL) 1200 MG CAPS, Take by mouth., Disp: , Rfl:   Allergies  Allergen Reactions  . Hydrochlorothiazide Other (See Comments)  . Lisinopril Cough    I personally reviewed active problem list, medication list, allergies, family history, social history with the patient/caregiver today.   ROS  Ten systems reviewed and is negative except as mentioned in HPI   Objective  Virtual encounter, vitals obtained at home  Today's Vitals   12/10/19 0840  BP: 137/80  Height: 5\' 1"  (1.549 m)   Body mass index is 33.14 kg/m.  Physical Exam  Awake, alert and oriented   PHQ2/9: Depression screen [facility] 2/9 12/10/2019 08/06/2019 05/07/2019 01/15/2019 10/16/2018  Decreased Interest 0 0 0 0 0  Down, Depressed, Hopeless 0 0 0 0 0  PHQ - 2 Score 0 0 0 0 0  Altered sleeping 0 0 0 1 1  Tired,  decreased energy 3 0 0 1 1  Change in appetite 1 0 0 0 0  Feeling bad or failure about yourself  0 0 0 0 0  Trouble concentrating 0 0 0 0 0  Moving slowly or fidgety/restless 0 0 0 0 0  Suicidal thoughts 0 0 0 0 0  PHQ-9 Score 4 0 0 2 2  Difficult doing work/chores Not difficult at all - Not difficult at all Not difficult at all Not difficult at all  Some recent data might be hidden   PHQ-2/9 Result is negative.    Fall Risk: Fall Risk  12/10/2019 08/06/2019 05/07/2019 01/15/2019 10/16/2018  Falls in the past year? 0 0 0 0 0  Number falls in past yr: 0 0 0 0 -  Injury with Fall? 0 0 0 0 -    Assessment & Plan  1. Depression, major, in remission [facility])  Doing  well   2. B12 deficiency  Continue otc supplementation   3. Vitamin D deficiency  Continue supplements  4. Essential hypertension  - amLODipine-valsartan (EXFORGE) 5-160 MG tablet; Take 1 tablet by mouth daily.  Dispense: 90 tablet; Refill: 0  5. GAD (generalized anxiety disorder)  - hydrOXYzine (ATARAX/VISTARIL) 10 MG tablet; Take 1 tablet (10 mg total) by mouth 3 (three) times daily as needed.  Dispense: 30 tablet; Refill: 0  Discussed therapy and she will consider  6. Dyslipidemia (high LDL; low HDL)   7. Pre-diabetes    I discussed the assessment and treatment plan with the patient. The patient was provided an opportunity to ask questions and all were answered. The patient agreed with the plan and demonstrated an understanding of the instructions.   The patient was advised to call back or seek an in-person evaluation if the symptoms worsen or if the condition fails to improve as anticipated.  I provided 25  minutes of non-face-to-face time during this encounter.  Ruel Favors, MD

## 2019-12-10 NOTE — Telephone Encounter (Signed)
Requested Prescriptions  Pending Prescriptions Disp Refills  . fluticasone (FLONASE) 50 MCG/ACT nasal spray [Pharmacy Med Name: FLUTICASONE PROP 50 MCG SPRAY] 48 mL 1    Sig: SPRAY 2 SPRAYS INTO EACH NOSTRIL EVERY DAY     Ear, Nose, and Throat: Nasal Preparations - Corticosteroids Passed - 12/10/2019 12:29 AM      Passed - Valid encounter within last 12 months    Recent Outpatient Visits          4 months ago Depression, major, in remission [facility])   [facility] [place], Danna Hefty, MD   7 months ago Depression, major, in remission [facility])   [facility] [place], Danna Hefty, MD   10 months ago Depression, major, in remission [facility])   [facility] [name], MD   1 year ago Essential hypertension   [facility] [name], MD   1 year ago Depression, major, recurrent, mild [facility])   [facility] [name], MD      Future Appointments            Today [name], MD [facility]

## 2020-02-26 DIAGNOSIS — L089 Local infection of the skin and subcutaneous tissue, unspecified: Secondary | ICD-10-CM | POA: Diagnosis not present

## 2020-04-20 ENCOUNTER — Other Ambulatory Visit: Payer: Self-pay | Admitting: Family Medicine

## 2020-04-20 DIAGNOSIS — Z1231 Encounter for screening mammogram for malignant neoplasm of breast: Secondary | ICD-10-CM

## 2020-04-21 ENCOUNTER — Encounter: Payer: Self-pay | Admitting: Family Medicine

## 2020-04-21 ENCOUNTER — Ambulatory Visit: Payer: BC Managed Care – PPO | Admitting: Family Medicine

## 2020-04-21 ENCOUNTER — Other Ambulatory Visit: Payer: Self-pay

## 2020-04-21 VITALS — BP 138/88 | HR 84 | Temp 98.3°F | Resp 16 | Ht 61.0 in | Wt 178.7 lb

## 2020-04-21 DIAGNOSIS — I10 Essential (primary) hypertension: Secondary | ICD-10-CM

## 2020-04-21 DIAGNOSIS — F325 Major depressive disorder, single episode, in full remission: Secondary | ICD-10-CM

## 2020-04-21 DIAGNOSIS — Z8619 Personal history of other infectious and parasitic diseases: Secondary | ICD-10-CM

## 2020-04-21 DIAGNOSIS — F411 Generalized anxiety disorder: Secondary | ICD-10-CM | POA: Diagnosis not present

## 2020-04-21 DIAGNOSIS — E538 Deficiency of other specified B group vitamins: Secondary | ICD-10-CM

## 2020-04-21 DIAGNOSIS — E559 Vitamin D deficiency, unspecified: Secondary | ICD-10-CM | POA: Diagnosis not present

## 2020-04-21 DIAGNOSIS — E785 Hyperlipidemia, unspecified: Secondary | ICD-10-CM

## 2020-04-21 DIAGNOSIS — R7303 Prediabetes: Secondary | ICD-10-CM

## 2020-04-21 MED ORDER — AMLODIPINE BESYLATE-VALSARTAN 5-160 MG PO TABS: 1.0000 | ORAL_TABLET | Freq: Every day | ORAL | 1 refills | Status: DC

## 2020-04-21 NOTE — Progress Notes (Signed)
Name: Christie Mcdonald   MRN: [id number]    DOB: [birth year]   Date:04/21/2020       Progress Note  Subjective  Chief Complaint  Chief Complaint  Patient presents with  . Follow-up  . Hypertension    HPI  HTN: she is taking medicationas prescribed she is back on Atenolol and Exforge .She denies dizziness, chest pain or palpitation. She states she was not checking bp until this past weekend and has been in the [id number]   Lichen Planus: diagnosed by dermatologist 5 years ago, on arms legs and feet, raised dark spots, some flat and some raised, mild pruritis at times,she used triamcinolone, she also developed some itching on her scalp,  using topical triamcinolone prn and seems to be helping She recently went back to dermatologist for a rash on left forehead and diagnosed with shingles and staph infection, took doxycycline and valtrex and is doing well   Hyperglycemia: she denies polydipsia , polyuria or polyphagia,last hgbA1C was stable at 6.2%.She has changed  her diet, she states recently, she is eating oatmeal for breakfast, eating a lot of vegetables, lean meat. .  Depression/Anxiety: she has a long history of anxiety and also depression, states that since around [id number] felt much worse withno motivation or energy. Changes have been, father diedApril 2018, husband lost his job for 2 months, since Dec brother in law diagnosed with lung cancer anddied June 2019.She states she never tried Hydroxyzine, she states she realizes she is not in control of anything and is okay with that.. She recently lost a close friend but is coping well, she was in her 19's   Vitamin D and B12 deficiency: she is taking supplementation. Unchanged   Dyslipidemia: she is taking Crestormostly twice a week,  last LDL was 112, HDL was low, discussed increasing fish and tree nuts, triglycerides was also high. She is trying to eat healthy. Advised to add Co-Q 10 since having some muscle cramps  intermittent    Patient Active Problem List   Diagnosis Date Noted  . Low vitamin B12 level 08/17/2018  . Systolic ejection murmur 50/07/3817  . Moderate recurrent major depression ([place]) 09/19/2017  . Gastro-esophageal reflux disease without esophagitis 09/20/2016  . Menopause 09/20/2016  . Temporomandibular joint-pain-dysfunction syndrome 09/20/2016  . Hyperglycemia 09/20/2016  . Obesity (BMI 30.0-34.9) 12/22/2015  . Vitamin D deficiency 12/22/2015  . Hypertension 04/23/2015  . Hyperlipemia 04/23/2015    Past Surgical History:  Procedure Laterality Date  . ABDOMINAL HYSTERECTOMY  1991  . BREAST BIOPSY Left 1997   negative  . COLONOSCOPY WITH PROPOFOL N/A 06/10/2015   Procedure: COLONOSCOPY WITH PROPOFOL;  Surgeon: Christene Lye, MD;  Location: [facility] ENDOSCOPY;  Service: Endoscopy;  Laterality: N/A;  . HEMORRHOID SURGERY  1988   Dr Annabell Sabal    Family History  Problem Relation Age of Onset  . Breast cancer Paternal Grandfather   . Cancer Paternal Grandfather        Breast  . Diabetes Mother   . Alzheimer's disease Mother   . Heart disease Mother        CHF  . Stroke Mother   . Dementia Mother   . Stroke Father   . Kidney disease Father   . Parkinson's disease Father   . Diabetes Maternal Grandmother   . Hypertension Sister   . Diabetes Sister   . Breast cancer Cousin     Social History   Tobacco Use  . Smoking status: Former Smoker  Quit date: 06/26/2014    Years since quitting: 5.8  . Smokeless tobacco: Never Used  Substance Use Topics  . Alcohol use: No    Alcohol/week: 0.0 standard drinks     Current Outpatient Medications:  .  amLODipine-valsartan (EXFORGE) 5-160 MG tablet, Take 1 tablet by mouth daily., Disp: 90 tablet, Rfl: 0 .  atenolol (TENORMIN) 25 MG tablet, TAKE 1/2 TABLET BY MOUTH EVERY DAY, Disp: 45 tablet, Rfl: 2 .  baclofen (LIORESAL) 10 MG tablet, TAKE ONE TABLET BY MOUTH AT BEDTIME AS NEEDED FOR MUSCLE SPASMS, TENSION  HEADACHE, NECK PAIN, Disp: 90 tablet, Rfl: 0 .  Cholecalciferol (VITAMIN D3) 5000 UNITS CAPS, Take 1 capsule by mouth daily., Disp: , Rfl:  .  Cyanocobalamin (B-12 SL), Place 1 drop under the tongue daily., Disp: , Rfl:  .  fexofenadine (ALLEGRA) 180 MG tablet, Take 1 tablet (180 mg total) by mouth daily., Disp: 90 tablet, Rfl: 1 .  fluticasone (FLONASE) 50 MCG/ACT nasal spray, SPRAY 2 SPRAYS INTO EACH NOSTRIL EVERY DAY, Disp: 48 mL, Rfl: 1 .  rosuvastatin (CRESTOR) 5 MG tablet, TAKE 1 TABLET BY MOUTH 2 TIMES A WEEK,DUE 01/19 (Patient taking differently: Take 5 mg by mouth once a week. ), Disp: 24 tablet, Rfl: 1 .  triamcinolone ointment (KENALOG) 0.1 %, APPLY ON AFFECTED AREA TWICE DAILY AS NEEDED., Disp: , Rfl:  .  valACYclovir (VALTREX) 1000 MG tablet, Take 1,000 mg by mouth 3 (three) times daily., Disp: , Rfl:  .  hydrOXYzine (ATARAX/VISTARIL) 10 MG tablet, Take 1 tablet (10 mg total) by mouth 3 (three) times daily as needed. (Patient not taking: Reported on 04/21/2020), Disp: 30 tablet, Rfl: 0 .  Omega-3 Fatty Acids (FISH OIL) 1200 MG CAPS, Take by mouth. (Patient not taking: Reported on 04/21/2020), Disp: , Rfl:   Allergies  Allergen Reactions  . Hydrochlorothiazide Other (See Comments)  . Lisinopril Cough    I personally reviewed active problem list, medication list, allergies, family history, social history, health maintenance with the patient/caregiver today.   ROS  Constitutional: Negative for fever or weight change.  Respiratory: Negative for cough and shortness of breath.   Cardiovascular: Negative for chest pain or palpitations.  Gastrointestinal: Negative for abdominal pain, no bowel changes.  Musculoskeletal: positive  for gait problem. Has right groin pain when she walks - likely OA, discussed referral to Ortho but she would like to hold off for now,  No joint swelling.  Skin: Negative for rash.  Neurological: Negative for dizziness or headache.  No other specific  complaints in a complete review of systems (except as listed in HPI above).  Objective  Vitals:   04/21/20 1115  BP: 138/88  Pulse: 84  Resp: 16  Temp: 98.3 F (36.8 C)  TempSrc: Temporal  SpO2: 98%  Weight: 178 lb 11.2 oz (81.1 kg)  Height: 5\' 1"  (1.549 m)    Body mass index is 33.77 kg/m.  Physical Exam  Constitutional: Patient appears well-developed and well-nourished. Obese  No distress.  HEENT: head atraumatic, normocephalic, pupils equal and reactive to light,  neck supple Cardiovascular: Normal rate, regular rhythm and normal heart sounds.  No murmur heard. No BLE edema. Pulmonary/Chest: Effort normal and breath sounds normal. No respiratory distress. Abdominal: Soft.  There is no tenderness. Psychiatric: Patient has a normal mood and affect. behavior is normal. Judgment and thought content normal.  PHQ2/9: Depression screen [facility] 2/9 04/21/2020 12/10/2019 08/06/2019 05/07/2019 01/15/2019  Decreased Interest 0 0 0 0 0  Down, Depressed,  Hopeless 0 0 0 0 0  PHQ - 2 Score 0 0 0 0 0  Altered sleeping 0 0 0 0 1  Tired, decreased energy 0 3 0 0 1  Change in appetite 0 1 0 0 0  Feeling bad or failure about yourself  0 0 0 0 0  Trouble concentrating 0 0 0 0 0  Moving slowly or fidgety/restless 0 0 0 0 0  Suicidal thoughts 0 0 0 0 0  PHQ-9 Score 0 4 0 0 2  Difficult doing work/chores - Not difficult at all - Not difficult at all Not difficult at all  Some recent data might be hidden    phq 9 is negative   Fall Risk: Fall Risk  04/21/2020 12/10/2019 08/06/2019 05/07/2019 01/15/2019  Falls in the past year? 0 0 0 0 0  Number falls in past yr: - 0 0 0 0  Injury with Fall? - 0 0 0 0     Functional Status Survey: Is the patient deaf or have difficulty hearing?: No Does the patient have difficulty seeing, even when wearing glasses/contacts?: No Does the patient have difficulty concentrating, remembering, or making decisions?: No Does the patient have difficulty walking or  climbing stairs?: No Does the patient have difficulty dressing or bathing?: No Does the patient have difficulty doing errands alone such as visiting a doctor's office or shopping?: No    Assessment & Plan  1. Essential hypertension  - amLODipine-valsartan (EXFORGE) 5-160 MG tablet; Take 1 tablet by mouth daily.  Dispense: 90 tablet; Refill: 1  2. B12 deficiency   3. GAD (generalized anxiety disorder)  Doing better  4. Vitamin D deficiency   5. Dyslipidemia (high LDL; low HDL)   6. Pre-diabetes  On life style modification   7. Depression, major, in remission [facility])  Not on medication - doing well   8. History of shingles

## 2020-04-30 ENCOUNTER — Ambulatory Visit
Admission: RE | Admit: 2020-04-30 | Discharge: 2020-04-30 | Disposition: A | Discharge status: Home / Self Care | Payer: BC Managed Care – PPO | Source: Ambulatory Visit | Attending: Family Medicine | Admitting: Family Medicine

## 2020-04-30 DIAGNOSIS — Z1231 Encounter for screening mammogram for malignant neoplasm of breast: Secondary | ICD-10-CM

## 2020-06-10 ENCOUNTER — Other Ambulatory Visit: Payer: Self-pay | Admitting: Family Medicine

## 2020-06-10 DIAGNOSIS — J302 Other seasonal allergic rhinitis: Secondary | ICD-10-CM

## 2020-08-25 ENCOUNTER — Encounter: Payer: Self-pay | Admitting: Family Medicine

## 2020-08-25 ENCOUNTER — Other Ambulatory Visit: Payer: Self-pay

## 2020-08-25 ENCOUNTER — Ambulatory Visit: Payer: BC Managed Care – PPO | Admitting: Family Medicine

## 2020-08-25 VITALS — BP 132/80 | HR 78 | Temp 98.0°F | Resp 16 | Ht 61.0 in | Wt 180.7 lb

## 2020-08-25 DIAGNOSIS — G8929 Other chronic pain: Secondary | ICD-10-CM

## 2020-08-25 DIAGNOSIS — F325 Major depressive disorder, single episode, in full remission: Secondary | ICD-10-CM

## 2020-08-25 DIAGNOSIS — E538 Deficiency of other specified B group vitamins: Secondary | ICD-10-CM

## 2020-08-25 DIAGNOSIS — I10 Essential (primary) hypertension: Secondary | ICD-10-CM

## 2020-08-25 DIAGNOSIS — F411 Generalized anxiety disorder: Secondary | ICD-10-CM

## 2020-08-25 DIAGNOSIS — M25551 Pain in right hip: Secondary | ICD-10-CM

## 2020-08-25 DIAGNOSIS — E785 Hyperlipidemia, unspecified: Secondary | ICD-10-CM | POA: Diagnosis not present

## 2020-08-25 DIAGNOSIS — R739 Hyperglycemia, unspecified: Secondary | ICD-10-CM

## 2020-08-25 DIAGNOSIS — L900 Lichen sclerosus et atrophicus: Secondary | ICD-10-CM

## 2020-08-25 DIAGNOSIS — J302 Other seasonal allergic rhinitis: Secondary | ICD-10-CM

## 2020-08-25 DIAGNOSIS — M62838 Other muscle spasm: Secondary | ICD-10-CM

## 2020-08-25 DIAGNOSIS — G44209 Tension-type headache, unspecified, not intractable: Secondary | ICD-10-CM

## 2020-08-25 DIAGNOSIS — J3089 Other allergic rhinitis: Secondary | ICD-10-CM

## 2020-08-25 DIAGNOSIS — E559 Vitamin D deficiency, unspecified: Secondary | ICD-10-CM

## 2020-08-25 MED ORDER — AMLODIPINE BESYLATE-VALSARTAN 5-160 MG PO TABS: 1.0000 | ORAL_TABLET | Freq: Every day | ORAL | 1 refills | Status: DC

## 2020-08-25 MED ORDER — ROSUVASTATIN CALCIUM 5 MG PO TABS: ORAL_TABLET | ORAL | 1 refills | Status: DC

## 2020-08-25 MED ORDER — BACLOFEN 10 MG PO TABS: 10.0000 mg | ORAL_TABLET | Freq: Every day | ORAL | 1 refills | Status: DC | PRN

## 2020-08-25 MED ORDER — ATENOLOL 25 MG PO TABS: 12.5000 mg | ORAL_TABLET | Freq: Every day | ORAL | 1 refills | Status: DC

## 2020-08-25 NOTE — Progress Notes (Signed)
Name: Christie Mcdonald   MRN: [id number]    DOB: 03/26/1961   Date:08/25/2020       Progress Note  Subjective  Chief Complaint  Follow up   HPI   HTN: she is taking medicationas prescribed she is back on Atenolol and Exforge .She denies dizziness, chest pain or palpitation. She states she was not checking bp until this past weekend and has been in the 120's-130's at home, continue current regiment   Lichen Planus: diagnosed by dermatologist 5 years ago, on arms legs and feet, raised dark spots, some flat and some raised, mild pruritis at times,she uses topical steroids prn   Hyperglycemia: she denies polydipsia , polyuria or polyphagia,last hgbA1C was stable at 6.2%.She has changed  her diet, she states recently, we will recheck A1C today   Depression/Anxiety: she has a long history of anxiety and also depression, states that since around [id number] felt much worse withno motivation or energy. Changes have been, father diedApril 2018, husband lost his job for 2 months, since Dec brother in law diagnosed with lung cancer anddied June 2019.She states she never tried Hydroxyzine, she states she realizes she is not in control of anything and is okay with that.She states since 2020 she has been doing better, spending more time with self reflexion , enjoying her place in time, also grandson is coming over after school    Vitamin D and B12 deficiency: she is taking supplementation, back in 2019 levels were normal we will not recheck it today   Dyslipidemia: she is taking Crestormostly twice a week,  last LDL was 112, HDL was low, she has a more balanced diet.  Tension headaches: taking baclofen for that and also for body aches, usually tightness and stiff on her nuchal area, no photophobia, phonophobia.   Patient Active Problem List   Diagnosis Date Noted  . Low vitamin B12 level 08/17/2018  . Systolic ejection murmur 01/16/2018  . Moderate recurrent major depression (HCC)  09/19/2017  . Gastro-esophageal reflux disease without esophagitis 09/20/2016  . Menopause 09/20/2016  . Temporomandibular joint-pain-dysfunction syndrome 09/20/2016  . Hyperglycemia 09/20/2016  . Obesity (BMI 30.0-34.9) 12/22/2015  . Vitamin D deficiency 12/22/2015  . Hypertension 04/23/2015  . Hyperlipemia 04/23/2015    Past Surgical History:  Procedure Laterality Date  . ABDOMINAL HYSTERECTOMY  1991  . BREAST BIOPSY Left 1997   negative  . COLONOSCOPY WITH PROPOFOL N/A 06/10/2015   Procedure: COLONOSCOPY WITH PROPOFOL;  Surgeon: Kieth Brightly, MD;  Location: [facility] ENDOSCOPY;  Service: Endoscopy;  Laterality: N/A;  . HEMORRHOID SURGERY  1988   Dr Lorelee New    Family History  Problem Relation Age of Onset  . Breast cancer Paternal Grandfather   . Cancer Paternal Grandfather        Breast  . Diabetes Mother   . Alzheimer's disease Mother   . Heart disease Mother        CHF  . Stroke Mother   . Dementia Mother   . Stroke Father   . Kidney disease Father   . Parkinson's disease Father   . Diabetes Maternal Grandmother   . Hypertension Sister   . Diabetes Sister   . Breast cancer Cousin     Social History   Tobacco Use  . Smoking status: Former Smoker    Quit date: 06/26/2014    Years since quitting: 6.1  . Smokeless tobacco: Never Used  Substance Use Topics  . Alcohol use: No    Alcohol/week: 0.0 standard  drinks     Current Outpatient Medications:  .  amLODipine-valsartan (EXFORGE) 5-160 MG tablet, Take 1 tablet by mouth daily., Disp: 90 tablet, Rfl: 1 .  atenolol (TENORMIN) 25 MG tablet, Take 0.5 tablets (12.5 mg total) by mouth daily., Disp: 45 tablet, Rfl: 1 .  baclofen (LIORESAL) 10 MG tablet, Take 1 tablet (10 mg total) by mouth daily as needed for muscle spasms., Disp: 90 tablet, Rfl: 1 .  Cholecalciferol (VITAMIN D3) 5000 UNITS CAPS, Take 1 capsule by mouth daily., Disp: , Rfl:  .  Cyanocobalamin (B-12 SL), Place 1 drop under the tongue daily.,  Disp: , Rfl:  .  fluticasone (FLONASE) 50 MCG/ACT nasal spray, SPRAY 2 SPRAYS INTO EACH NOSTRIL EVERY DAY, Disp: 48 mL, Rfl: 1 .  rosuvastatin (CRESTOR) 5 MG tablet, TAKE 1 TABLET BY MOUTH 2 TIMES A WEEK,DUE 01/19, Disp: 24 tablet, Rfl: 1 .  triamcinolone ointment (KENALOG) 0.1 %, APPLY ON AFFECTED AREA TWICE DAILY AS NEEDED., Disp: , Rfl:   Allergies  Allergen Reactions  . Hydrochlorothiazide Other (See Comments)  . Lisinopril Cough    I personally reviewed active problem list, medication list, allergies, family history, social history, health maintenance with the patient/caregiver today.   ROS  Constitutional: Negative for fever or weight change.  Respiratory: Negative for cough and shortness of breath.   Cardiovascular: Negative for chest pain or palpitations.  Gastrointestinal: Negative for abdominal pain, no bowel changes.  Musculoskeletal: Negative for gait problem or joint swelling.  Skin: Negative for rash.  Neurological: Negative for dizziness or headache.  No other specific complaints in a complete review of systems (except as listed in HPI above).  Objective  Vitals:   08/25/20 1058  BP: 132/80  Pulse: 78  Resp: 16  Temp: 98 F (36.7 C)  TempSrc: Oral  SpO2: 94%  Weight: 180 lb 11.2 oz (82 kg)  Height: 5\' 1"  (1.549 m)    Body mass index is 34.14 kg/m.  Physical Exam  Constitutional: Patient appears well-developed and well-nourished. Obese  No distress.  HEENT: head atraumatic, normocephalic, pupils equal and reactive to light,neck supple Cardiovascular: Normal rate, regular rhythm and normal heart sounds.  No murmur heard. No BLE edema. Pulmonary/Chest: Effort normal and breath sounds normal. No respiratory distress. Abdominal: Soft.  There is no tenderness. Psychiatric: Patient has a normal mood and affect. behavior is normal. Judgment and thought content normal.  PHQ2/9: Depression screen [facility] 2/9 08/25/2020 04/21/2020 12/10/2019 08/06/2019 05/07/2019   Decreased Interest 0 0 0 0 0  Down, Depressed, Hopeless 0 0 0 0 0  PHQ - 2 Score 0 0 0 0 0  Altered sleeping 0 0 0 0 0  Tired, decreased energy 0 0 3 0 0  Change in appetite 0 0 1 0 0  Feeling bad or failure about yourself  0 0 0 0 0  Trouble concentrating 0 0 0 0 0  Moving slowly or fidgety/restless 0 0 0 0 0  Suicidal thoughts 0 0 0 0 0  PHQ-9 Score 0 0 4 0 0  Difficult doing work/chores - - Not difficult at all - Not difficult at all  Some recent data might be hidden    phq 9 is negative   Fall Risk: Fall Risk  08/25/2020 04/21/2020 12/10/2019 08/06/2019 05/07/2019  Falls in the past year? 0 0 0 0 0  Number falls in past yr: 0 - 0 0 0  Injury with Fall? 0 - 0 0 0     Functional  Status Survey: Is the patient deaf or have difficulty hearing?: No Does the patient have difficulty seeing, even when wearing glasses/contacts?: No Does the patient have difficulty concentrating, remembering, or making decisions?: No Does the patient have difficulty walking or climbing stairs?: No Does the patient have difficulty dressing or bathing?: No Does the patient have difficulty doing errands alone such as visiting a doctor's office or shopping?: No    Assessment & Plan  1. Essential hypertension  - amLODipine-valsartan (EXFORGE) 5-160 MG tablet; Take 1 tablet by mouth daily.  Dispense: 90 tablet; Refill: 1 - atenolol (TENORMIN) 25 MG tablet; Take 0.5 tablets (12.5 mg total) by mouth daily.  Dispense: 45 tablet; Refill: 1 - COMPLETE METABOLIC PANEL WITH GFR - CBC with Differential/Platelet  2. Tension headache  - baclofen (LIORESAL) 10 MG tablet; Take 1 tablet (10 mg total) by mouth daily as needed for muscle spasms.  Dispense: 90 tablet; Refill: 1  3. Chronic right hip pain  - baclofen (LIORESAL) 10 MG tablet; Take 1 tablet (10 mg total) by mouth daily as needed for muscle spasms.  Dispense: 90 tablet; Refill: 1  4. Muscle spasm  - baclofen (LIORESAL) 10 MG tablet; Take 1 tablet  (10 mg total) by mouth daily as needed for muscle spasms.  Dispense: 90 tablet; Refill: 1  5. Dyslipidemia (high LDL; low HDL)  - rosuvastatin (CRESTOR) 5 MG tablet; TAKE 1 TABLET BY MOUTH 2 TIMES A WEEK,DUE 01/19  Dispense: 24 tablet; Refill: 1 - Lipid panel  6. Lichen sclerosus   7. Hyperglycemia  - Hemoglobin A1c  8. Vitamin D deficiency   9. B12 deficiency   10. Depression, major, in remission (HCC)   11. GAD (generalized anxiety disorder)   12. Perennial allergic rhinitis with seasonal variation

## 2020-08-26 LAB — LIPID PANEL
Cholesterol: 183 mg/dL (ref <200)
HDL: 35 mg/dL — ABNORMAL LOW (ref >=50)
LDL Cholesterol (Calc): 110 mg/dL (calc) — ABNORMAL HIGH
Non-HDL Cholesterol (Calc): 148 mg/dL (calc) — ABNORMAL HIGH (ref <130)
Total CHOL/HDL Ratio: 5.2 (calc) — ABNORMAL HIGH (ref <5.0)
Triglycerides: 266 mg/dL — ABNORMAL HIGH (ref <150)

## 2020-08-26 LAB — HEMOGLOBIN A1C
Hgb A1c MFr Bld: 6.1 % of total Hgb — ABNORMAL HIGH (ref <5.7)
Mean Plasma Glucose: 128 (calc)
eAG (mmol/L): 7.1 (calc)

## 2020-08-26 LAB — CBC WITH DIFFERENTIAL/PLATELET
Absolute Monocytes: 511 cells/uL (ref 200–950)
Basophils Absolute: 77 cells/uL (ref 0–200)
Basophils Relative: 1.1 %
Eosinophils Absolute: 210 cells/uL (ref 15–500)
Eosinophils Relative: 3 %
HCT: 40 % (ref 35.0–45.0)
Hemoglobin: 13.1 g/dL (ref 11.7–15.5)
Lymphs Abs: 1351 cells/uL (ref 850–3900)
MCH: 30.3 pg (ref 27.0–33.0)
MCHC: 32.8 g/dL (ref 32.0–36.0)
MCV: 92.4 fL (ref 80.0–100.0)
MPV: 11.3 fL (ref 7.5–12.5)
Monocytes Relative: 7.3 %
Neutro Abs: 4851 cells/uL (ref 1500–7800)
Neutrophils Relative %: 69.3 %
Platelets: 330 10*3/uL (ref 140–400)
RBC: 4.33 10*6/uL (ref 3.80–5.10)
RDW: 13.3 % (ref 11.0–15.0)
Total Lymphocyte: 19.3 %
WBC: 7 10*3/uL (ref 3.8–10.8)

## 2020-08-26 LAB — COMPLETE METABOLIC PANEL WITH GFR
AG Ratio: 1.7 (calc) (ref 1.0–2.5)
ALT: 20 U/L (ref 6–29)
AST: 21 U/L (ref 10–35)
Albumin: 4.5 g/dL (ref 3.6–5.1)
Alkaline phosphatase (APISO): 92 U/L (ref 37–153)
BUN: 13 mg/dL (ref 7–25)
CO2: 31 mmol/L (ref 20–32)
Calcium: 9.4 mg/dL (ref 8.6–10.4)
Chloride: 101 mmol/L (ref 98–110)
Creat: 0.87 mg/dL (ref 0.50–0.99)
GFR, Est African American: 84 mL/min/{1.73_m2} (ref >=60)
GFR, Est Non African American: 72 mL/min/{1.73_m2} (ref >=60)
Globulin: 2.7 g/dL (calc) (ref 1.9–3.7)
Glucose, Bld: 94 mg/dL (ref 65–99)
Potassium: 4.8 mmol/L (ref 3.5–5.3)
Sodium: 139 mmol/L (ref 135–146)
Total Bilirubin: 0.4 mg/dL (ref 0.2–1.2)
Total Protein: 7.2 g/dL (ref 6.1–8.1)

## 2020-12-13 ENCOUNTER — Other Ambulatory Visit: Payer: Self-pay | Admitting: Family Medicine

## 2020-12-13 DIAGNOSIS — J302 Other seasonal allergic rhinitis: Secondary | ICD-10-CM

## 2021-02-02 ENCOUNTER — Other Ambulatory Visit: Payer: Self-pay | Admitting: Family Medicine

## 2021-02-02 DIAGNOSIS — E785 Hyperlipidemia, unspecified: Secondary | ICD-10-CM

## 2021-02-02 NOTE — Telephone Encounter (Signed)
Requested Prescriptions  Pending Prescriptions Disp Refills  . rosuvastatin (CRESTOR) 5 MG tablet [Pharmacy Med Name: ROSUVASTATIN CALCIUM 5 MG TAB] 24 tablet 1    Sig: TAKE 1 TABLET BY MOUTH 2 TIMES A WEEK,DUE 01/19     Cardiovascular:  Antilipid - Statins Failed - 02/02/2021  1:18 PM      Failed - LDL in normal range and within 360 days    LDL Cholesterol (Calc)  Date Value Ref Range Status  08/25/2020 110 (H) mg/dL (calc) Final    Comment:    Reference range: <100 . Desirable range <100 mg/dL for primary prevention;   <70 mg/dL for patients with CHD or diabetic patients  with > or = 2 CHD risk factors. Marland Kitchen LDL-C is now calculated using the Martin-Hopkins  calculation, which is a validated novel method providing  better accuracy than the Friedewald equation in the  estimation of LDL-C.  Horald Pollen et al. Lenox Ahr. 8921;194(17): 2061-2068  (http://education.[facility].com/faq/FAQ164)          Failed - HDL in normal range and within 360 days    HDL  Date Value Ref Range Status  08/25/2020 35 (L) > OR = 50 mg/dL Final  40/81/4481 39 (L) >39 mg/dL Final         Failed - Triglycerides in normal range and within 360 days    Triglycerides  Date Value Ref Range Status  08/25/2020 266 (H) <150 mg/dL Final    Comment:    . If a non-fasting specimen was collected, consider repeat triglyceride testing on a fasting specimen if clinically indicated.  Perry Mount et al. J. of Clin. Lipidol. 2015;9:129-169. Marland Kitchen          Passed - Total Cholesterol in normal range and within 360 days    Cholesterol, Total  Date Value Ref Range Status  01/07/2016 146 100 - 199 mg/dL Final   Cholesterol  Date Value Ref Range Status  08/25/2020 183 <200 mg/dL Final         Passed - Patient is not pregnant      Passed - Valid encounter within last 12 months    Recent Outpatient Visits          5 months ago Lichen sclerosus   [facility] [name], MD   9 months ago B12  deficiency   [facility] [name], MD   1 year ago Essential hypertension   [facility] [name], MD   1 year ago Depression, major, in remission [facility])   [facility] [name], MD   1 year ago Depression, major, in remission [facility])   [facility] [name], MD      Future Appointments            In 4 weeks [name], MD [facility], [facility]

## 2021-02-22 ENCOUNTER — Other Ambulatory Visit: Payer: Self-pay | Admitting: Family Medicine

## 2021-02-22 DIAGNOSIS — I10 Essential (primary) hypertension: Secondary | ICD-10-CM

## 2021-02-25 ENCOUNTER — Ambulatory Visit: Payer: BC Managed Care – PPO | Admitting: Family Medicine

## 2021-03-01 NOTE — Progress Notes (Signed)
Name: Christie Mcdonald   MRN: [id number]    DOB: [birth year]   Date:03/02/2021       Progress Note  Subjective  Chief Complaint  Follow up   HPI   HTN: she is taking medicationas prescribed she is back on Atenolol and Exforge .She denies dizziness, chest pain or palpitation. She checks her bp at home a couple of times a month  120's-130's70-80's at home.  Lichen Planus: diagnosed by dermatologist 6 years ago, on arms legs and feet, raised dark spots, some flat and some raised, mild pruritis at times,she uses topical steroids prn , symptoms are under control   Hyperglycemia: she denies polydipsia , polyuria or polyphagia,last hgbA1C was stable at 6.1%.She is more mindful about the carb intake   Depression/Anxiety: she has a long history of anxiety and also depression, states that since around [id number] felt much worse withno motivation or energy. Changes have been, father diedApril 2018, husband lost his job for 2 months, since Dec brother in law diagnosed with lung cancer anddied June 2019.She states she never tried Hydroxyzine, she states she realizes she is not in control of anything and is okay with that.She states since 2020 she has been doing better, spending more time with self reflexion , enjoying her place in time. She is still doing well    Vitamin D and B12 deficiency: she is taking supplementation, back in 2019 levels were normal , she is still taking supplementation   Dyslipidemia: she is currently only taking Crestor once a week , she states taking 2 a week caused her hands to get numb, but resolved once she went down to once a week   last LDL was 110, HDL was low, she has a more balanced diet, she has been taking fish oil otc. Discussed considering taking half of crestor three times weekly and add tree nuts and fish to her diet, also consider stopping fish oil.   Tension headaches: taking baclofen for that and also for body aches, usually tightness and stiff on her  nuchal area, no photophobia, phonophobia.Episodes have increased with high pollen count, about once a week   Patient Active Problem List   Diagnosis Date Noted  . Low vitamin B12 level 08/17/2018  . Systolic ejection murmur 01/16/2018  . Moderate recurrent major depression (HCC) 09/19/2017  . Gastro-esophageal reflux disease without esophagitis 09/20/2016  . Menopause 09/20/2016  . Temporomandibular joint-pain-dysfunction syndrome 09/20/2016  . Hyperglycemia 09/20/2016  . Obesity (BMI 30.0-34.9) 12/22/2015  . Vitamin D deficiency 12/22/2015  . Hypertension 04/23/2015  . Hyperlipemia 04/23/2015    Past Surgical History:  Procedure Laterality Date  . ABDOMINAL HYSTERECTOMY  1991  . BREAST BIOPSY Left 1997   negative  . COLONOSCOPY WITH PROPOFOL N/A 06/10/2015   Procedure: COLONOSCOPY WITH PROPOFOL;  Surgeon: Kieth Brightly, MD;  Location: [facility] ENDOSCOPY;  Service: Endoscopy;  Laterality: N/A;  . HEMORRHOID SURGERY  1988   Dr Lorelee New    Family History  Problem Relation Age of Onset  . Breast cancer Paternal Grandfather   . Cancer Paternal Grandfather        Breast  . Diabetes Mother   . Alzheimer's disease Mother   . Heart disease Mother        CHF  . Stroke Mother   . Dementia Mother   . Stroke Father   . Kidney disease Father   . Parkinson's disease Father   . Diabetes Maternal Grandmother   . Hypertension Sister   . Diabetes  Sister   . Breast cancer Cousin     Social History   Tobacco Use  . Smoking status: Former Smoker    Quit date: 06/26/2014    Years since quitting: 6.6  . Smokeless tobacco: Never Used  Substance Use Topics  . Alcohol use: No    Alcohol/week: 0.0 standard drinks     Current Outpatient Medications:  .  amLODipine-valsartan (EXFORGE) 5-160 MG tablet, Take 1 tablet by mouth daily., Disp: 90 tablet, Rfl: 1 .  atenolol (TENORMIN) 25 MG tablet, TAKE 1/2 TABLET BY MOUTH EVERY DAY, Disp: 45 tablet, Rfl: 0 .  baclofen (LIORESAL) 10  MG tablet, Take 1 tablet (10 mg total) by mouth daily as needed for muscle spasms., Disp: 90 tablet, Rfl: 1 .  Cholecalciferol (VITAMIN D3) 5000 UNITS CAPS, Take 1 capsule by mouth daily., Disp: , Rfl:  .  Cyanocobalamin (B-12 SL), Place 1 drop under the tongue daily., Disp: , Rfl:  .  fluticasone (FLONASE) 50 MCG/ACT nasal spray, SPRAY 2 SPRAYS INTO EACH NOSTRIL EVERY DAY, Disp: 48 mL, Rfl: 1 .  rosuvastatin (CRESTOR) 5 MG tablet, TAKE 1 TABLET BY MOUTH 2 TIMES A WEEK,DUE 01/19, Disp: 24 tablet, Rfl: 1 .  triamcinolone ointment (KENALOG) 0.1 %, APPLY ON AFFECTED AREA TWICE DAILY AS NEEDED., Disp: , Rfl:   Allergies  Allergen Reactions  . Hydrochlorothiazide Other (See Comments)  . Lisinopril Cough    I personally reviewed active problem list, medication list, allergies, family history, social history, health maintenance with the patient/caregiver today.   ROS  Constitutional: Negative for fever or weight change.  Respiratory: Negative for cough and shortness of breath.   Cardiovascular: Negative for chest pain or palpitations.  Gastrointestinal: Negative for abdominal pain, no bowel changes.  Musculoskeletal: Negative for gait problem or joint swelling.  Skin: Negative for rash.  Neurological: Negative for dizziness or headache.  No other specific complaints in a complete review of systems (except as listed in HPI above).  Objective  Vitals:   03/02/21 0936  BP: 132/86  Pulse: 95  Resp: 16  Temp: 98.1 F (36.7 C)  TempSrc: Oral  SpO2: 99%  Weight: 180 lb (81.6 kg)  Height: 5' (1.524 m)    Body mass index is 35.15 kg/m.  Physical Exam  Constitutional: Patient appears well-developed and well-nourished. Obese  No distress.  HEENT: head atraumatic, normocephalic, pupils equal and reactive to light,  neck supple Cardiovascular: Normal rate, regular rhythm and normal heart sounds.  No murmur heard. No BLE edema. Pulmonary/Chest: Effort normal and breath sounds normal. No  respiratory distress. Abdominal: Soft.  There is no tenderness. Psychiatric: Patient has a normal mood and affect. behavior is normal. Judgment and thought content normal.   PHQ2/9: Depression screen [facility] 2/9 03/02/2021 08/25/2020 04/21/2020 12/10/2019 08/06/2019  Decreased Interest 0 0 0 0 0  Down, Depressed, Hopeless 0 0 0 0 0  PHQ - 2 Score 0 0 0 0 0  Altered sleeping 0 0 0 0 0  Tired, decreased energy 0 0 0 3 0  Change in appetite 0 0 0 1 0  Feeling bad or failure about yourself  0 0 0 0 0  Trouble concentrating 0 0 0 0 0  Moving slowly or fidgety/restless 0 0 0 0 0  Suicidal thoughts 0 0 0 0 0  PHQ-9 Score 0 0 0 4 0  Difficult doing work/chores - - - Not difficult at all -  Some recent data might be hidden  phq 9 is negative   Fall Risk: Fall Risk  03/02/2021 08/25/2020 04/21/2020 12/10/2019 08/06/2019  Falls in the past year? 0 0 0 0 0  Number falls in past yr: 0 0 - 0 0  Injury with Fall? 0 0 - 0 0     Functional Status Survey: Is the patient deaf or have difficulty hearing?: No Does the patient have difficulty seeing, even when wearing glasses/contacts?: No Does the patient have difficulty concentrating, remembering, or making decisions?: No Does the patient have difficulty walking or climbing stairs?: No Does the patient have difficulty dressing or bathing?: No Does the patient have difficulty doing errands alone such as visiting a doctor's office or shopping?: No   Assessment & Plan  1. Essential hypertension  - amLODipine-valsartan (EXFORGE) 5-160 MG tablet; Take 1 tablet by mouth daily.  Dispense: 90 tablet; Refill: 1  2. Tension headache   3. Muscle spasm  Take baclofen prn   4. Lichen sclerosus  Keep with dermatologist visits   5. B12 deficiency   6. Dyslipidemia (high LDL; low HDL)   7. Vitamin D deficiency  Continue supplementation   8. Perennial allergic rhinitis with seasonal variation   9. Depression, major, in remission (HCC)  In  remission  10. Pre-diabetes  Reminded of low carb diet

## 2021-03-02 ENCOUNTER — Other Ambulatory Visit: Payer: Self-pay

## 2021-03-02 ENCOUNTER — Ambulatory Visit: Payer: BC Managed Care – PPO | Admitting: Family Medicine

## 2021-03-02 ENCOUNTER — Encounter: Payer: Self-pay | Admitting: Family Medicine

## 2021-03-02 VITALS — BP 132/86 | HR 95 | Temp 98.1°F | Resp 16 | Ht 60.0 in | Wt 180.0 lb

## 2021-03-02 DIAGNOSIS — E559 Vitamin D deficiency, unspecified: Secondary | ICD-10-CM

## 2021-03-02 DIAGNOSIS — G44209 Tension-type headache, unspecified, not intractable: Secondary | ICD-10-CM

## 2021-03-02 DIAGNOSIS — I10 Essential (primary) hypertension: Secondary | ICD-10-CM

## 2021-03-02 DIAGNOSIS — M62838 Other muscle spasm: Secondary | ICD-10-CM

## 2021-03-02 DIAGNOSIS — E785 Hyperlipidemia, unspecified: Secondary | ICD-10-CM

## 2021-03-02 DIAGNOSIS — F325 Major depressive disorder, single episode, in full remission: Secondary | ICD-10-CM

## 2021-03-02 DIAGNOSIS — E538 Deficiency of other specified B group vitamins: Secondary | ICD-10-CM

## 2021-03-02 DIAGNOSIS — L900 Lichen sclerosus et atrophicus: Secondary | ICD-10-CM

## 2021-03-02 DIAGNOSIS — J302 Other seasonal allergic rhinitis: Secondary | ICD-10-CM

## 2021-03-02 DIAGNOSIS — J3089 Other allergic rhinitis: Secondary | ICD-10-CM

## 2021-03-02 DIAGNOSIS — R7303 Prediabetes: Secondary | ICD-10-CM

## 2021-03-02 MED ORDER — AMLODIPINE BESYLATE-VALSARTAN 5-160 MG PO TABS
1.0000 | ORAL_TABLET | Freq: Every day | ORAL | 1 refills | Status: DC
Start: 2021-03-02 — End: 2021-10-06

## 2021-06-02 ENCOUNTER — Other Ambulatory Visit: Payer: Self-pay | Admitting: Family Medicine

## 2021-06-02 DIAGNOSIS — I10 Essential (primary) hypertension: Secondary | ICD-10-CM

## 2021-06-02 NOTE — Telephone Encounter (Signed)
Requested Prescriptions  Pending Prescriptions Disp Refills  . atenolol (TENORMIN) 25 MG tablet [Pharmacy Med Name: ATENOLOL 25 MG TABLET] 45 tablet 0    Sig: TAKE 1/2 TABLET BY MOUTH EVERY DAY     Cardiovascular:  Beta Blockers Passed - 06/02/2021  3:26 AM      Passed - Last BP in normal range    BP Readings from Last 1 Encounters:  03/02/21 132/86         Passed - Last Heart Rate in normal range    Pulse Readings from Last 1 Encounters:  03/02/21 95         Passed - Valid encounter within last 6 months    Recent Outpatient Visits          3 months ago Essential hypertension   [facility] [name], MD   9 months ago Lichen sclerosus   [facility] [name], MD   1 year ago B12 deficiency   [facility] [name], MD   1 year ago Essential hypertension   [facility] [name], MD   1 year ago Depression, major, in remission [facility])   [facility] [name], MD      Future Appointments            In 1 month [name], MD [facility]   In 3 months [name], MD [facility], [facility]

## 2021-06-12 ENCOUNTER — Other Ambulatory Visit: Payer: Self-pay | Admitting: Family Medicine

## 2021-06-12 DIAGNOSIS — J302 Other seasonal allergic rhinitis: Secondary | ICD-10-CM

## 2021-07-15 ENCOUNTER — Encounter: Payer: Self-pay | Admitting: Family Medicine

## 2021-07-15 ENCOUNTER — Other Ambulatory Visit: Payer: Self-pay

## 2021-07-15 ENCOUNTER — Ambulatory Visit (INDEPENDENT_AMBULATORY_CARE_PROVIDER_SITE_OTHER): Payer: BC Managed Care – PPO | Admitting: Family Medicine

## 2021-07-15 VITALS — BP 130/80 | HR 84 | Temp 98.3°F | Resp 16 | Ht 60.0 in | Wt 181.0 lb

## 2021-07-15 DIAGNOSIS — Z0000 Encounter for general adult medical examination without abnormal findings: Secondary | ICD-10-CM

## 2021-07-15 DIAGNOSIS — R7303 Prediabetes: Secondary | ICD-10-CM | POA: Diagnosis not present

## 2021-07-15 DIAGNOSIS — E785 Hyperlipidemia, unspecified: Secondary | ICD-10-CM

## 2021-07-15 DIAGNOSIS — E559 Vitamin D deficiency, unspecified: Secondary | ICD-10-CM | POA: Diagnosis not present

## 2021-07-15 DIAGNOSIS — E538 Deficiency of other specified B group vitamins: Secondary | ICD-10-CM

## 2021-07-15 DIAGNOSIS — Z1231 Encounter for screening mammogram for malignant neoplasm of breast: Secondary | ICD-10-CM | POA: Diagnosis not present

## 2021-07-15 DIAGNOSIS — I10 Essential (primary) hypertension: Secondary | ICD-10-CM

## 2021-07-15 NOTE — Patient Instructions (Addendum)
[facility] [employer]   Preventive Care 41-61 Years Old, Female Preventive care refers to lifestyle choices and visits with your health care provider that can promote health and wellness. This includes: A yearly physical exam. This is also called an annual wellness visit. Regular dental and eye exams. Immunizations. Screening for certain conditions. Healthy lifestyle choices, such as: Eating a healthy diet. Getting regular exercise. Not using drugs or products that contain nicotine and tobacco. Limiting alcohol use. What can I expect for my preventive care visit? Physical exam Your health care provider will check your: Height and weight. These may be used to calculate your BMI (body mass index). BMI is a measurement that tells if you are at a healthy weight. Heart rate and blood pressure. Body temperature. Skin for abnormal spots. Counseling Your health care provider may ask you questions about your: Past medical problems. Family's medical history. Alcohol, tobacco, and drug use. Emotional well-being. Home life and relationship well-being. Sexual activity. Diet, exercise, and sleep habits. Work and work Statistician. Access to firearms. Method of birth control. Menstrual cycle. Pregnancy history. What immunizations do I need? Vaccines are usually given at various ages, according to a schedule. Your health care provider will recommend vaccines for you based on your age, medical history, and lifestyle or other factors, such as travel or where you work. What tests do I need? Blood tests Lipid and cholesterol levels. These may be checked every 5 years, or more often if you are over 55 years old. Hepatitis C test. Hepatitis B test. Screening Lung cancer screening. You may have this screening every year starting at age 30 if you have a 30-pack-year history of smoking and currently smoke or have quit within the past 15 years. Colorectal cancer screening. All adults should have this  screening starting at age 39 and continuing until age 72. Your health care provider may recommend screening at age 20 if you are at increased risk. You will have tests every 1-10 years, depending on your results and the type of screening test. Diabetes screening. This is done by checking your blood sugar (glucose) after you have not eaten for a while (fasting). You may have this done every 1-3 years. Mammogram. This may be done every 1-2 years. Talk with your health care provider about when you should start having regular mammograms. This may depend on whether you have a family history of breast cancer. BRCA-related cancer screening. This may be done if you have a family history of breast, ovarian, tubal, or peritoneal cancers. Pelvic exam and Pap test. This may be done every 3 years starting at age 11. Starting at age 53, this may be done every 5 years if you have a Pap test in combination with an HPV test. Other tests STD (sexually transmitted disease) testing, if you are at risk. Bone density scan. This is done to screen for osteoporosis. You may have this scan if you are at high risk for osteoporosis. Talk with your health care provider about your test results, treatment options, and if necessary, the need for more tests. Follow these instructions at home: Eating and drinking  Eat a diet that includes fresh fruits and vegetables, whole grains, lean protein, and low-fat dairy products. Take vitamin and mineral supplements as recommended by your health care provider. Do not drink alcohol if: Your health care provider tells you not to drink. You are pregnant, may be pregnant, or are planning to become pregnant. If you drink alcohol: Limit how much you have to 0-1  drink a day. Be aware of how much alcohol is in your drink. In the [place] one drink equals one 12 oz bottle of beer (355 mL), one 5 oz glass of wine (148 mL), or one 1 oz glass of hard liquor (44 mL). Lifestyle Take daily care  of your teeth and gums. Brush your teeth every morning and night with fluoride toothpaste. Floss one time each day. Stay active. Exercise for at least 30 minutes 5 or more days each week. Do not use any products that contain nicotine or tobacco, such as cigarettes, e-cigarettes, and chewing tobacco. If you need help quitting, ask your health care provider. Do not use drugs. If you are sexually active, practice safe sex. Use a condom or other form of protection to prevent STIs (sexually transmitted infections). If you do not wish to become pregnant, use a form of birth control. If you plan to become pregnant, see your health care provider for a prepregnancy visit. If told by your health care provider, take low-dose aspirin daily starting at age 77. Find healthy ways to cope with stress, such as: Meditation, yoga, or listening to music. Journaling. Talking to a trusted person. Spending time with friends and family. Safety Always wear your seat belt while driving or riding in a vehicle. Do not drive: If you have been drinking alcohol. Do not ride with someone who has been drinking. When you are tired or distracted. While texting. Wear a helmet and other protective equipment during sports activities. If you have firearms in your house, make sure you follow all gun safety procedures. What's next? Visit your health care provider once a year for an annual wellness visit. Ask your health care provider how often you should have your eyes and teeth checked. Stay up to date on all vaccines. This information is not intended to replace advice given to you by your health care provider. Make sure you discuss any questions you have with your health care provider. Document Revised: 01/01/2021 Document Reviewed: 07/05/2018 Elsevier Patient Education  2022 [employer].

## 2021-07-15 NOTE — Progress Notes (Signed)
Name: Christie Mcdonald   MRN: [id number]    DOB: 1961/01/18   Date:07/15/2021       Progress Note  Subjective  Chief Complaint  Annual Exam  HPI  Patient presents for annual CPE.  Diet: continue balanced diet  Exercise: needs to increase physical activity   [place] Office Visit from 08/25/2020 in [facility]  AUDIT-C Score 0      Depression: Phq 9 is  negative Depression screen [facility] 2/9 07/15/2021 03/02/2021 08/25/2020 04/21/2020 12/10/2019  Decreased Interest 0 0 0 0 0  Down, Depressed, Hopeless 0 0 0 0 0  PHQ - 2 Score 0 0 0 0 0  Altered sleeping 0 0 0 0 0  Tired, decreased energy 0 0 0 0 3  Change in appetite 0 0 0 0 1  Feeling bad or failure about yourself  0 0 0 0 0  Trouble concentrating 0 0 0 0 0  Moving slowly or fidgety/restless 0 0 0 0 0  Suicidal thoughts 0 0 0 0 0  PHQ-9 Score 0 0 0 0 4  Difficult doing work/chores - - - - Not difficult at all  Some recent data might be hidden   Hypertension: BP Readings from Last 3 Encounters:  07/15/21 130/80  03/02/21 132/86  08/25/20 132/80   Obesity: Wt Readings from Last 3 Encounters:  07/15/21 181 lb (82.1 kg)  03/02/21 180 lb (81.6 kg)  08/25/20 180 lb 11.2 oz (82 kg)   BMI Readings from Last 3 Encounters:  07/15/21 35.35 kg/m  03/02/21 35.15 kg/m  08/25/20 34.14 kg/m     Vaccines:   Shingrix: she wants to think about it  Pneumonia: educated and discussed with patient. Flu: educated and discussed with patient.  Hep C Screening: 09/22/16 STD testing and prevention (HIV/chl/gon/syphilis): N/A Intimate partner violence: negative Sexual History : married, one partner, no pain , discomfort or discharge  Menstrual History/LMP/Abnormal Bleeding: discussed post-menopausal bleeding and need to follow up Incontinence Symptoms: no problems   Breast cancer:  - Last Mammogram: 04/30/20 - BRCA gene screening: paternal grandfather had breast cancer   Osteoporosis: Discussed high calcium  and vitamin D supplementation, weight bearing exercises  Cervical cancer screening: s/p hysterectomy   Skin cancer: Discussed monitoring for atypical lesions  Colorectal cancer: 07/15/15   Lung cancer: Low Dose CT Chest recommended if Age 78-80 years, 20 pack-year currently smoking OR have quit w/in 15years. Patient does not qualify.   ECG: 02/26/13  Advanced Care Planning: A voluntary discussion about advance care planning including the explanation and discussion of advance directives.  Discussed health care proxy and Living will, and the patient was able to identify a health care proxy as husband.    Lipids: Lab Results  Component Value Date   CHOL 183 08/25/2020   CHOL 176 08/06/2019   CHOL 182 07/10/2018   Lab Results  Component Value Date   HDL 35 (L) 08/25/2020   HDL 33 (L) 08/06/2019   HDL 38 (L) 07/10/2018   Lab Results  Component Value Date   LDLCALC 110 (H) 08/25/2020   LDLCALC 112 (H) 08/06/2019   LDLCALC 116 (H) 07/10/2018   Lab Results  Component Value Date   TRIG 266 (H) 08/25/2020   TRIG 185 (H) 08/06/2019   TRIG 166 (H) 07/10/2018   Lab Results  Component Value Date   CHOLHDL 5.2 (H) 08/25/2020   CHOLHDL 5.3 (H) 08/06/2019   CHOLHDL 4.8 07/10/2018   No results found for:  LDLDIRECT  Glucose: Glucose  Date Value Ref Range Status  02/27/2013 111 (H) 65 - 99 mg/dL Final  02/27/2013 114 (H) 65 - 99 mg/dL Final  02/26/2013 110 (H) 65 - 99 mg/dL Final   Glucose, Bld  Date Value Ref Range Status  08/25/2020 94 65 - 99 mg/dL Final    Comment:    .            Fasting reference interval .   08/06/2019 107 (H) 65 - 99 mg/dL Final    Comment:    .            Fasting reference interval . For someone without known diabetes, a glucose value between 100 and 125 mg/dL is consistent with prediabetes and should be confirmed with a follow-up test. .   07/10/2018 93 65 - 139 mg/dL Final    Comment:    .        Non-fasting reference interval .      Patient Active Problem List   Diagnosis Date Noted   Low vitamin B12 level 16/08/9603   Systolic ejection murmur 54/07/8118   Moderate recurrent major depression ([place]) 09/19/2017   Gastro-esophageal reflux disease without esophagitis 09/20/2016   Menopause 09/20/2016   Temporomandibular joint-pain-dysfunction syndrome 09/20/2016   Hyperglycemia 09/20/2016   Obesity (BMI 30.0-34.9) 12/22/2015   Vitamin D deficiency 12/22/2015   Hypertension 04/23/2015   Hyperlipemia 04/23/2015    Past Surgical History:  Procedure Laterality Date   ABDOMINAL HYSTERECTOMY  1991   BREAST BIOPSY Left 1997   negative   COLONOSCOPY WITH PROPOFOL N/A 06/10/2015   Procedure: COLONOSCOPY WITH PROPOFOL;  Surgeon: Christene Lye, MD;  Location: [facility] ENDOSCOPY;  Service: Endoscopy;  Laterality: N/A;   HEMORRHOID SURGERY  1988   Dr Annabell Sabal    Family History  Problem Relation Age of Onset   Breast cancer Paternal Grandfather    Cancer Paternal Grandfather        Breast   Diabetes Mother    Alzheimer's disease Mother    Heart disease Mother        CHF   Stroke Mother    Dementia Mother    Stroke Father    Kidney disease Father    Parkinson's disease Father    Diabetes Maternal Grandmother    Hypertension Sister    Diabetes Sister    Breast cancer Cousin     Social History   Socioeconomic History   Marital status: Married    Spouse name: "Nada Boozer"   Number of children: 2   Years of education: Not on file   Highest education level: 12th grade  Occupational History   Not on file  Tobacco Use   Smoking status: Former    Types: Cigarettes    Quit date: 06/26/2014    Years since quitting: 7.0   Smokeless tobacco: Never  Vaping Use   Vaping Use: Never used  Substance and Sexual Activity   Alcohol use: No    Alcohol/week: 0.0 standard drinks   Drug use: No   Sexual activity: Yes    Partners: Male    Birth control/protection: None  Other Topics Concern   Not on file   Social History Narrative   Not on file   Social Determinants of Health   Financial Resource Strain: Low Risk    Difficulty of Paying Living Expenses: Not hard at all  Food Insecurity: No Food Insecurity   Worried About [place] in the Last Year:  Never true   Ran Out of Food in the Last Year: Never true  Transportation Needs: No Transportation Needs   Lack of Transportation (Medical): No   Lack of Transportation (Non-Medical): No  Physical Activity: Inactive   Days of Exercise per Week: 0 days   Minutes of Exercise per Session: 0 min  Stress: No Stress Concern Present   Feeling of Stress : Not at all  Social Connections: Moderately Isolated   Frequency of Communication with Friends and Family: More than three times a week   Frequency of Social Gatherings with Friends and Family: More than three times a week   Attends Religious Services: Never   Marine scientist or Organizations: No   Attends Music therapist: Never   Marital Status: Married  Human resources officer Violence: Not At Risk   Fear of Current or Ex-Partner: No   Emotionally Abused: No   Physically Abused: No   Sexually Abused: No     Current Outpatient Medications:    amLODipine-valsartan (EXFORGE) 5-160 MG tablet, Take 1 tablet by mouth daily., Disp: 90 tablet, Rfl: 1   atenolol (TENORMIN) 25 MG tablet, TAKE 1/2 TABLET BY MOUTH EVERY DAY, Disp: 45 tablet, Rfl: 0   baclofen (LIORESAL) 10 MG tablet, Take 1 tablet (10 mg total) by mouth daily as needed for muscle spasms., Disp: 90 tablet, Rfl: 1   Cholecalciferol (VITAMIN D3) 5000 UNITS CAPS, Take 1 capsule by mouth daily., Disp: , Rfl:    Cyanocobalamin (B-12 SL), Place 1 drop under the tongue daily., Disp: , Rfl:    fluticasone (FLONASE) 50 MCG/ACT nasal spray, SPRAY 2 SPRAYS INTO EACH NOSTRIL EVERY DAY, Disp: 48 mL, Rfl: 1   rosuvastatin (CRESTOR) 5 MG tablet, TAKE 1 TABLET BY MOUTH 2 TIMES A WEEK,DUE 01/19, Disp: 24 tablet, Rfl: 1    triamcinolone ointment (KENALOG) 0.1 %, APPLY ON AFFECTED AREA TWICE DAILY AS NEEDED., Disp: , Rfl:   Allergies  Allergen Reactions   Hydrochlorothiazide Other (See Comments)   Lisinopril Cough     ROS  Constitutional: Negative for fever or weight change.  Respiratory: Negative for cough and shortness of breath.   Cardiovascular: Negative for chest pain or palpitations.  Gastrointestinal: Negative for abdominal pain, no bowel changes.  Musculoskeletal: Negative for gait problem or joint swelling.  Skin: Negative for rash.  Neurological: Negative for dizziness or headache.  No other specific complaints in a complete review of systems (except as listed in HPI above).   Objective  Vitals:   07/15/21 0943  BP: 130/80  Pulse: 84  Resp: 16  Temp: 98.3 F (36.8 C)  SpO2: 97%  Weight: 181 lb (82.1 kg)  Height: 5' (1.524 m)    Body mass index is 35.35 kg/m.  Physical Exam  Constitutional: Patient appears well-developed and well-nourished. No distress.  HENT: Head: Normocephalic and atraumatic. Ears: B TMs ok, no erythema or effusion; Nose: Nose normal. Mouth/Throat: not done  Eyes: Conjunctivae and EOM are normal. Pupils are equal, round, and reactive to light. No scleral icterus.  Neck: Normal range of motion. Neck supple. No JVD present. No thyromegaly present.  Cardiovascular: Normal rate, regular rhythm and normal heart sounds.  No murmur heard. No BLE edema. Pulmonary/Chest: Effort normal and breath sounds normal. No respiratory distress. Abdominal: Soft. Bowel sounds are normal, no distension. There is no tenderness. no masses Breast: no lumps or masses, no nipple discharge or rashes FEMALE GENITALIA:  External genitalia normal External urethra normal Vaginal vault normal  without discharge or lesions Cervix normal without discharge or lesions Bimanual exam normal without masses RECTAL: not done  Musculoskeletal: Normal range of motion, no joint effusions. No gross  deformities Neurological: he is alert and oriented to person, place, and time. No cranial nerve deficit. Coordination, balance, strength, speech and gait are normal.  Skin: Skin is warm and dry. No rash noted. No erythema.  Psychiatric: Patient has a normal mood and affect. behavior is normal. Judgment and thought content normal.   Fall Risk: Fall Risk  07/15/2021 03/02/2021 08/25/2020 04/21/2020 12/10/2019  Falls in the past year? 0 0 0 0 0  Number falls in past yr: 0 0 0 - 0  Injury with Fall? 0 0 0 - 0  Risk for fall due to : No Fall Risks - - - -  Follow up Falls prevention discussed - - - -     Functional Status Survey: Is the patient deaf or have difficulty hearing?: No Does the patient have difficulty seeing, even when wearing glasses/contacts?: No Does the patient have difficulty concentrating, remembering, or making decisions?: No Does the patient have difficulty walking or climbing stairs?: No Does the patient have difficulty dressing or bathing?: No Does the patient have difficulty doing errands alone such as visiting a doctor's office or shopping?: No   Assessment & Plan  1. Well adult exam  - Lipid panel - CBC with Differential/Platelet - COMPLETE METABOLIC PANEL WITH GFR - VITAMIN D 25 Hydroxy (Vit-D Deficiency, Fractures) - Vitamin B12 - Hemoglobin A1c  2. Breast cancer screening by mammogram  - MM 3D SCREEN BREAST BILATERAL; Future  3. Vitamin D deficiency  - VITAMIN D 25 Hydroxy (Vit-D Deficiency, Fractures)  4. Pre-diabetes  - Hemoglobin A1c  5. B12 deficiency  - Vitamin B12  6. Dyslipidemia (high LDL; low HDL)  - Lipid panel  7. Essential hypertension  - CBC with Differential/Platelet - COMPLETE METABOLIC PANEL WITH GFR    -USPSTF grade A and B recommendations reviewed with patient; age-appropriate recommendations, preventive care, screening tests, etc discussed and encouraged; healthy living encouraged; see AVS for patient education given to  patient -Discussed importance of 150 minutes of physical activity weekly, eat two servings of fish weekly, eat one serving of tree nuts ( cashews, pistachios, pecans, almonds.Marland Kitchen) every other day, eat 6 servings of fruit/vegetables daily and drink plenty of water and avoid sweet beverages.

## 2021-07-16 DIAGNOSIS — E559 Vitamin D deficiency, unspecified: Secondary | ICD-10-CM | POA: Diagnosis not present

## 2021-07-16 DIAGNOSIS — E538 Deficiency of other specified B group vitamins: Secondary | ICD-10-CM | POA: Diagnosis not present

## 2021-07-16 DIAGNOSIS — E785 Hyperlipidemia, unspecified: Secondary | ICD-10-CM | POA: Diagnosis not present

## 2021-07-16 DIAGNOSIS — I10 Essential (primary) hypertension: Secondary | ICD-10-CM | POA: Diagnosis not present

## 2021-07-17 LAB — COMPLETE METABOLIC PANEL WITH GFR
AG Ratio: 1.8 (calc) (ref 1.0–2.5)
ALT: 17 U/L (ref 6–29)
AST: 17 U/L (ref 10–35)
Albumin: 4.4 g/dL (ref 3.6–5.1)
Alkaline phosphatase (APISO): 75 U/L (ref 37–153)
BUN: 14 mg/dL (ref 7–25)
CO2: 27 mmol/L (ref 20–32)
Calcium: 9 mg/dL (ref 8.6–10.4)
Chloride: 102 mmol/L (ref 98–110)
Creat: 0.66 mg/dL (ref 0.50–1.05)
Globulin: 2.4 g/dL (calc) (ref 1.9–3.7)
Glucose, Bld: 111 mg/dL — ABNORMAL HIGH (ref 65–99)
Potassium: 4.5 mmol/L (ref 3.5–5.3)
Sodium: 139 mmol/L (ref 135–146)
Total Bilirubin: 0.5 mg/dL (ref 0.2–1.2)
Total Protein: 6.8 g/dL (ref 6.1–8.1)
eGFR: 100 mL/min/{1.73_m2} (ref >=60)

## 2021-07-17 LAB — LIPID PANEL
Cholesterol: 150 mg/dL (ref <200)
HDL: 35 mg/dL — ABNORMAL LOW (ref >=50)
LDL Cholesterol (Calc): 93 mg/dL (calc)
Non-HDL Cholesterol (Calc): 115 mg/dL (calc) (ref <130)
Total CHOL/HDL Ratio: 4.3 (calc) (ref <5.0)
Triglycerides: 128 mg/dL (ref <150)

## 2021-07-17 LAB — CBC WITH DIFFERENTIAL/PLATELET
Absolute Monocytes: 530 cells/uL (ref 200–950)
Basophils Absolute: 88 cells/uL (ref 0–200)
Basophils Relative: 1.3 %
Eosinophils Absolute: 272 cells/uL (ref 15–500)
Eosinophils Relative: 4 %
HCT: 38 % (ref 35.0–45.0)
Hemoglobin: 12.4 g/dL (ref 11.7–15.5)
Lymphs Abs: 1115 cells/uL (ref 850–3900)
MCH: 29.8 pg (ref 27.0–33.0)
MCHC: 32.6 g/dL (ref 32.0–36.0)
MCV: 91.3 fL (ref 80.0–100.0)
MPV: 10.9 fL (ref 7.5–12.5)
Monocytes Relative: 7.8 %
Neutro Abs: 4794 cells/uL (ref 1500–7800)
Neutrophils Relative %: 70.5 %
Platelets: 307 10*3/uL (ref 140–400)
RBC: 4.16 10*6/uL (ref 3.80–5.10)
RDW: 13.6 % (ref 11.0–15.0)
Total Lymphocyte: 16.4 %
WBC: 6.8 10*3/uL (ref 3.8–10.8)

## 2021-07-17 LAB — VITAMIN B12: Vitamin B-12: 933 pg/mL (ref 200–1100)

## 2021-07-17 LAB — HEMOGLOBIN A1C
Hgb A1c MFr Bld: 6.1 % of total Hgb — ABNORMAL HIGH (ref <5.7)
Mean Plasma Glucose: 128 mg/dL
eAG (mmol/L): 7.1 mmol/L

## 2021-07-17 LAB — VITAMIN D 25 HYDROXY (VIT D DEFICIENCY, FRACTURES): Vit D, 25-Hydroxy: 92 ng/mL (ref 30–100)

## 2021-07-25 ENCOUNTER — Other Ambulatory Visit: Payer: Self-pay | Admitting: Family Medicine

## 2021-07-25 DIAGNOSIS — E785 Hyperlipidemia, unspecified: Secondary | ICD-10-CM

## 2021-07-26 ENCOUNTER — Other Ambulatory Visit: Payer: Self-pay

## 2021-07-26 ENCOUNTER — Ambulatory Visit
Admission: RE | Admit: 2021-07-26 | Discharge: 2021-07-26 | Disposition: A | Discharge status: Home / Self Care | Payer: BC Managed Care – PPO | Source: Ambulatory Visit | Attending: Family Medicine | Admitting: Family Medicine

## 2021-07-26 DIAGNOSIS — Z1231 Encounter for screening mammogram for malignant neoplasm of breast: Secondary | ICD-10-CM | POA: Insufficient documentation

## 2021-09-02 ENCOUNTER — Ambulatory Visit (INDEPENDENT_AMBULATORY_CARE_PROVIDER_SITE_OTHER): Payer: 59 | Admitting: Family Medicine

## 2021-09-02 ENCOUNTER — Other Ambulatory Visit: Payer: Self-pay

## 2021-09-02 ENCOUNTER — Encounter: Payer: Self-pay | Admitting: Family Medicine

## 2021-09-02 VITALS — BP 132/78 | HR 76 | Temp 98.2°F | Resp 16 | Ht 60.0 in | Wt 182.4 lb

## 2021-09-02 DIAGNOSIS — J3089 Other allergic rhinitis: Secondary | ICD-10-CM

## 2021-09-02 DIAGNOSIS — E559 Vitamin D deficiency, unspecified: Secondary | ICD-10-CM

## 2021-09-02 DIAGNOSIS — F411 Generalized anxiety disorder: Secondary | ICD-10-CM

## 2021-09-02 DIAGNOSIS — F325 Major depressive disorder, single episode, in full remission: Secondary | ICD-10-CM | POA: Diagnosis not present

## 2021-09-02 DIAGNOSIS — R7303 Prediabetes: Secondary | ICD-10-CM | POA: Diagnosis not present

## 2021-09-02 DIAGNOSIS — E538 Deficiency of other specified B group vitamins: Secondary | ICD-10-CM

## 2021-09-02 DIAGNOSIS — G44209 Tension-type headache, unspecified, not intractable: Secondary | ICD-10-CM

## 2021-09-02 DIAGNOSIS — I10 Essential (primary) hypertension: Secondary | ICD-10-CM

## 2021-09-02 DIAGNOSIS — J302 Other seasonal allergic rhinitis: Secondary | ICD-10-CM

## 2021-09-02 DIAGNOSIS — L900 Lichen sclerosus et atrophicus: Secondary | ICD-10-CM

## 2021-09-02 DIAGNOSIS — E785 Hyperlipidemia, unspecified: Secondary | ICD-10-CM

## 2021-09-02 DIAGNOSIS — M659 Synovitis and tenosynovitis, unspecified: Secondary | ICD-10-CM

## 2021-09-02 MED ORDER — ATENOLOL 25 MG PO TABS: 12.5000 mg | ORAL_TABLET | Freq: Every day | ORAL | 1 refills | Status: DC

## 2021-09-02 MED ORDER — MELOXICAM 15 MG PO TABS: 15.0000 mg | ORAL_TABLET | Freq: Every day | ORAL | 0 refills | Status: DC

## 2021-09-02 MED ORDER — BUSPIRONE HCL 5 MG PO TABS: 5.0000 mg | ORAL_TABLET | Freq: Three times a day (TID) | ORAL | 0 refills | Status: DC | PRN

## 2021-09-02 NOTE — Progress Notes (Signed)
Name: Christie Mcdonald   MRN: [id number]    DOB: 1960-11-15   Date:09/02/2021       Progress Note  Subjective  Chief Complaint  Follow Up  HPI  HTN: she is taking medication as prescribed she is back on Atenolol and Exforge .She denies dizziness, chest pain or palpitation. She checks her bp at home a couple of times a month  and has been controlled 354'T/62'B    Lichen Planus: diagnosed by dermatologist 6 years ago, on arms legs and feet, raised dark spots, some flat and some raised, mild pruritis at times, she uses topical steroids prn , symptoms are under control , she does not have any lesions at this time    Hyperglycemia/pre-diabetes : she denies polydipsia , polyuria or polyphagia, last hgbA1C was stable at 6.1%. She is more mindful about the carb intake   Mjajor Depression/Anxiety: she has a long history of anxiety and also depression, states that since around July 2018 she felt much worse with no motivation or energy. Changes have been, father died 03/14/17, husband lost his job for 2 months, since Dec brother in law diagnosed with lung cancer and died May 14, 2018. She states she never tried Hydroxyzine. She states since 2020 she has been feeling better. She still has episodes of anxiety , at times it can trigger a headache.     Vitamin D and B12 deficiency: she is taking supplementation, reviewed labs, doing well   Morbid obesity: BMI over 35 with co-morbidities such at HTN, dyslipidemia, dysthymia., discussed dietary modification . She is monitoring her weight at home and is around 180 lbs also    Dyslipidemia: she is currently taking Crestor half pill three times a week. Last LDL improved, LDL down from 110 to 93 . Discussed ways to increase good cholesterol   The 10-year ASCVD risk score (Arnett DK, et al., 2019) is: 5.6%   Values used to calculate the score:     Age: 60 years     Sex: Female     Is Non-Hispanic African American: No     Diabetic: No     Tobacco smoker: No      Systolic Blood Pressure: 638 mmHg     Is BP treated: Yes     HDL Cholesterol: 35 mg/dL     Total Cholesterol: 150 mg/dL   Tension headaches: taking baclofen for that and also for body aches, usually tightness and stiff on her nuchal area, no photophobia, phonophobia.   Left thumb pain: she states over the past 4 weeks she noticed pain on left thumb and wrist, sometimes gets swollen, tender with movement . She took a few ibuprofen and has improved   Patient Active Problem List   Diagnosis Date Noted   Low vitamin B12 level 93/73/4287   Systolic ejection murmur 68/09/5725   Moderate recurrent major depression ([place]) 09/19/2017   Gastro-esophageal reflux disease without esophagitis 09/20/2016   Menopause 09/20/2016   Temporomandibular joint-pain-dysfunction syndrome 09/20/2016   Hyperglycemia 09/20/2016   Obesity (BMI 30.0-34.9) 12/22/2015   Vitamin D deficiency 12/22/2015   Hypertension 04/23/2015   Hyperlipemia 04/23/2015    Past Surgical History:  Procedure Laterality Date   ABDOMINAL HYSTERECTOMY  1991   BREAST BIOPSY Left 1997   negative   COLONOSCOPY WITH PROPOFOL N/A 06/10/2015   Procedure: COLONOSCOPY WITH PROPOFOL;  Surgeon: Christene Lye, MD;  Location: [facility] END[facility]COPY;  Service: Endoscopy;  Laterality: N/A;   HEMORRHOID SURGERY  1988   Dr Christia Reading  [place]    Family History  Problem Relation Age of Onset   Diabetes Mother    Alzheimer's disease Mother    Heart disease Mother        CHF   Stroke Mother    Dementia Mother    Stroke Father    Kidney disease Father    Parkinson's disease Father    Hypertension Sister    Diabetes Sister    Diabetes Maternal Grandmother    Breast cancer Paternal Grandfather    Cancer Paternal Grandfather        Breast   Breast cancer Cousin        maternal-twice    Social History   Tobacco Use   Smoking status: Former    Types: Cigarettes    Quit date: 06/26/2014    Years since quitting: 7.1   Smokeless tobacco:  Never  Substance Use Topics   Alcohol use: No    Alcohol/week: 0.0 standard drinks     Current Outpatient Medications:    amLODipine-valsartan (EXFORGE) 5-160 MG tablet, Take 1 tablet by mouth daily., Disp: 90 tablet, Rfl: 1   atenolol (TENORMIN) 25 MG tablet, TAKE 1/2 TABLET BY MOUTH EVERY DAY, Disp: 45 tablet, Rfl: 0   baclofen (LIORESAL) 10 MG tablet, Take 1 tablet (10 mg total) by mouth daily as needed for muscle spasms., Disp: 90 tablet, Rfl: 1   Cholecalciferol (VITAMIN D3) 5000 UNITS CAPS, Take 1 capsule by mouth daily., Disp: , Rfl:    Cyanocobalamin (B-12 SL), Place 1 drop under the tongue daily., Disp: , Rfl:    fluticasone (FLONASE) 50 MCG/ACT nasal spray, SPRAY 2 SPRAYS INTO EACH NOSTRIL EVERY DAY, Disp: 48 mL, Rfl: 1   rosuvastatin (CRESTOR) 5 MG tablet, TAKE 1 TABLET BY MOUTH 2 TIMES A WEEK, Disp: 24 tablet, Rfl: 1   triamcinolone ointment (KENALOG) 0.1 %, APPLY ON AFFECTED AREA TWICE DAILY AS NEEDED., Disp: , Rfl:   Allergies  Allergen Reactions   Hydrochlorothiazide Other (See Comments)   Lisinopril Cough    I personally reviewed active problem list, medication list, allergies, family history, social history, health maintenance with the patient/caregiver today.   ROS  Constitutional: Negative for fever or weight change.  Respiratory: Negative for cough and shortness of breath.   Cardiovascular: Negative for chest pain or palpitations.  Gastrointestinal: Negative for abdominal pain, no bowel changes.  Musculoskeletal: Negative for gait problem or joint swelling.  Skin: Negative for rash.  Neurological: Negative for dizziness or headache.  No other specific complaints in a complete review of systems (except as listed in HPI above).   Objective  Vitals:   09/02/21 0945  BP: 132/78  Pulse: 76  Resp: 16  Temp: 98.2 F (36.8 C)  TempSrc: Oral  SpO2: 98%  Weight: 182 lb 6.4 oz (82.7 kg)  Height: 5' (1.524 m)    Body mass index is 35.62 kg/m.  Physical  Exam  Constitutional: Patient appears well-developed and well-nourished. Obese  No distress.  HEENT: head atraumatic, normocephalic, pupils equal and reactive to light, neck supple Cardiovascular: Normal rate, regular rhythm and normal heart sounds.  No murmur heard. No BLE edema. Pulmonary/Chest: Effort normal and breath sounds normal. No respiratory distress. Abdominal: Soft.  There is no tenderness. Muscular Skeletal: tender with flexion of left thumb and palpation of radial aspect of left wrist. No redness or increase in warmth  Psychiatric: Patient has a normal mood and affect. behavior is normal. Judgment and thought content normal.   Recent  Results (from the past 2160 hour(s))  Lipid panel     Status: Abnormal   Collection Time: 07/16/21  8:10 AM  Result Value Ref Range   Cholesterol 150 <200 mg/dL   HDL 35 (L) > OR = 50 mg/dL   Triglycerides 128 <150 mg/dL   LDL Cholesterol (Calc) 93 mg/dL (calc)    Comment: Reference range: <100 . Desirable range <100 mg/dL for primary prevention;   <70 mg/dL for patients with CHD or diabetic patients  with > or = 2 CHD risk factors. Marland Kitchen LDL-C is now calculated using the Martin-Hopkins  calculation, which is a validated novel method providing  better accuracy than the Friedewald equation in the  estimation of LDL-C.  Cresenciano Genre et al. Annamaria Helling. 0388;828(00): 2061-2068  (http://education.[facility].com/faq/FAQ164)    Total CHOL/HDL Ratio 4.3 <5.0 (calc)   Non-HDL Cholesterol (Calc) 115 <130 mg/dL (calc)    Comment: For patients with diabetes plus 1 major ASCVD risk  factor, treating to a non-HDL-C goal of <100 mg/dL  (LDL-C of <70 mg/dL) is considered a therapeutic  option.   CBC with Differential/Platelet     Status: None   Collection Time: 07/16/21  8:10 AM  Result Value Ref Range   WBC 6.8 3.8 - 10.8 Thousand/uL   RBC 4.16 3.80 - 5.10 Million/uL   Hemoglobin 12.4 11.7 - 15.5 g/dL   HCT 38.0 35.0 - 45.0 %   MCV 91.3 80.0 - 100.0  fL   MCH 29.8 27.0 - 33.0 pg   MCHC 32.6 32.0 - 36.0 g/dL   RDW 13.6 11.0 - 15.0 %   Platelets 307 140 - 400 Thousand/uL   MPV 10.9 7.5 - 12.5 fL   Neutro Abs 4,794 1,500 - 7,800 cells/uL   Lymphs Abs 1,115 850 - 3,900 cells/uL   Absolute Monocytes 530 200 - 950 cells/uL   Eosinophils Absolute 272 15 - 500 cells/uL   Basophils Absolute 88 0 - 200 cells/uL   Neutrophils Relative % 70.5 %   Total Lymphocyte 16.4 %   Monocytes Relative 7.8 %   Eosinophils Relative 4.0 %   Basophils Relative 1.3 %  COMPLETE METABOLIC PANEL WITH GFR     Status: Abnormal   Collection Time: 07/16/21  8:10 AM  Result Value Ref Range   Glucose, Bld 111 (H) 65 - 99 mg/dL    Comment: .            Fasting reference interval . For someone without known diabetes, a glucose value between 100 and 125 mg/dL is consistent with prediabetes and should be confirmed with a follow-up test. .    BUN 14 7 - 25 mg/dL   Creat 0.66 0.50 - 1.05 mg/dL   eGFR 100 > OR = 60 mL/min/1.25m    Comment: The eGFR is based on the CKD-EPI 2021 equation. To calculate  the new eGFR from a previous Creatinine or Cystatin C result, go to https://www.kidney.org/professionals/ kdoqi/gfr%5Fcalculator    BUN/Creatinine Ratio NOT APPLICABLE 6 - 22 (calc)   Sodium 139 135 - 146 mmol/L   Potassium 4.5 3.5 - 5.3 mmol/L   Chloride 102 98 - 110 mmol/L   CO2 27 20 - 32 mmol/L   Calcium 9.0 8.6 - 10.4 mg/dL   Total Protein 6.8 6.1 - 8.1 g/dL   Albumin 4.4 3.6 - 5.1 g/dL   Globulin 2.4 1.9 - 3.7 g/dL (calc)   AG Ratio 1.8 1.0 - 2.5 (calc)   Total Bilirubin 0.5 0.2 - 1.2 mg/dL  Alkaline phosphatase (APISO) 75 37 - 153 U/L   AST 17 10 - 35 U/L   ALT 17 6 - 29 U/L  VITAMIN D 25 Hydroxy (Vit-D Deficiency, Fractures)     Status: None   Collection Time: 07/16/21  8:10 AM  Result Value Ref Range   Vit D, 25-Hydroxy 92 30 - 100 ng/mL    Comment: Vitamin D Status         25-OH Vitamin D: . Deficiency:                    <20  ng/mL Insufficiency:             20 - 29 ng/mL Optimal:                 > or = 30 ng/mL . For 25-OH Vitamin D testing on patients on  D2-supplementation and patients for whom quantitation  of D2 and D3 fractions is required, the Quest[facility]ureD(TM) 25-OH VIT D, (D2,D3), LC/MS/MS is recommended: order  code [phone] (patients >80yr). See Note 1 . Note 1 . For additional information, please refer to  http://education.[facility].com/faq/FAQ199  (This link is being provided for informational/ educational purposes only.)   Vitamin B12     Status: None   Collection Time: 07/16/21  8:10 AM  Result Value Ref Range   Vitamin B-12 933 200 - 1,100 pg/mL  Hemoglobin A1c     Status: Abnormal   Collection Time: 07/16/21  8:10 AM  Result Value Ref Range   Hgb A1c MFr Bld 6.1 (H) <5.7 % of total Hgb    Comment: For someone without known diabetes, a hemoglobin  A1c value between 5.7% and 6.4% is consistent with prediabetes and should be confirmed with a  follow-up test. . For someone with known diabetes, a value <7% indicates that their diabetes is well controlled. A1c targets should be individualized based on duration of diabetes, age, comorbid conditions, and other considerations. . This assay result is consistent with an increased risk of diabetes. . Currently, no consensus exists regarding use of hemoglobin A1c for diagnosis of diabetes for children. .    Mean Plasma Glucose 128 mg/dL   eAG (mmol/L) 7.1 mmol/L     PHQ2/9: [facility] 09/02/2021 07/15/2021 03/02/2021 08/25/2020 04/21/2020  Decreased Interest 0 0 0 0 0  Down, Depressed, Hopeless 0 0 0 0 0  PHQ - 2 Score 0 0 0 0 0  Altered sleeping 0 0 0 0 0  Tired, decreased energy 0 0 0 0 0  Change in appetite 0 0 0 0 0  Feeling bad or failure about yourself  0 0 0 0 0  Trouble concentrating 0 0 0 0 0  Moving slowly or fidgety/restless 0 0 0 0 0  Suicidal thoughts 0 0 0 0 0  PHQ-9 Score 0 0 0 0 0  Difficult  doing work/chores - - - - -  Some recent data might be hidden    phq 9 is negative   Fall Risk: Fall Risk  09/02/2021 07/15/2021 03/02/2021 08/25/2020 04/21/2020  Falls in the past year? 0 0 0 0 0  Number falls in past yr: - 0 0 0 -  Injury with Fall? - 0 0 0 -  Risk for fall due to : - No Fall Risks - - -  Follow up Falls prevention discussed Falls prevention discussed - - -     Assessment & Plan  1. Pre-diabetes  Reviewed low  carb diet   2. Depression, major, in remission [facility])  She wants to take prn medication only   3. B12 deficiency   4. Morbid obesity ([place])  Discussed with the patient the risk posed by an increased BMI. Discussed importance of portion control, calorie counting and at least 150 minutes of physical activity weekly. Avoid sweet beverages and drink more water. Eat at least 6 servings of fruit and vegetables daily    5. Dyslipidemia (high LDL; low HDL)   6. Vitamin D deficiency   7. Essential hypertension  - atenolol (TENORMIN) 25 MG tablet; Take 0.5 tablets (12.5 mg total) by mouth daily.  Dispense: 45 tablet; Refill: 1  8. Tension headache   9. Perennial allergic rhinitis with seasonal variation   10. Lichen sclerosus   11. GAD (generalized anxiety disorder)  - BusPIRone (BUSPAR) 5 MG tablet; Take 1-2 tablets (5-10 mg total) by mouth 3 (three) times daily as needed.  Dispense: 30 tablet; Refill: 0  12. Tenosynovitis of extensor hallucis longus tendon  - meloxicam (MOBIC) 15 MG tablet; Take 1 tablet (15 mg total) by mouth daily.  Dispense: 30 tablet; Refill: 0

## 2021-10-05 ENCOUNTER — Other Ambulatory Visit: Payer: Self-pay | Admitting: Family Medicine

## 2021-10-05 DIAGNOSIS — I10 Essential (primary) hypertension: Secondary | ICD-10-CM

## 2021-11-20 ENCOUNTER — Other Ambulatory Visit: Payer: Self-pay | Admitting: Nurse Practitioner

## 2021-11-20 DIAGNOSIS — E785 Hyperlipidemia, unspecified: Secondary | ICD-10-CM

## 2021-11-20 NOTE — Telephone Encounter (Signed)
Requested Prescriptions  Pending Prescriptions Disp Refills   rosuvastatin (CRESTOR) 5 MG tablet [Pharmacy Med Name: ROSUVASTATIN CALCIUM 5 MG TAB] 90 tablet 1    Sig: TAKE 1 TABLET BY MOUTH 2 TIMES A WEEK     Cardiovascular:  Antilipid - Statins Failed - 11/20/2021  2:17 PM      Failed - HDL in normal range and within 360 days    HDL  Date Value Ref Range Status  07/16/2021 35 (L) > OR = 50 mg/dL Final  45/36/4680 39 (L) >39 mg/dL Final         Passed - Total Cholesterol in normal range and within 360 days    Cholesterol, Total  Date Value Ref Range Status  01/07/2016 146 100 - 199 mg/dL Final   Cholesterol  Date Value Ref Range Status  07/16/2021 150 <200 mg/dL Final         Passed - LDL in normal range and within 360 days    LDL Cholesterol (Calc)  Date Value Ref Range Status  07/16/2021 93 mg/dL (calc) Final    Comment:    Reference range: <100 . Desirable range <100 mg/dL for primary prevention;   <70 mg/dL for patients with CHD or diabetic patients  with > or = 2 CHD risk factors. Marland Kitchen LDL-C is now calculated using the Martin-Hopkins  calculation, which is a validated novel method providing  better accuracy than the Friedewald equation in the  estimation of LDL-C.  Horald Pollen et al. Lenox Ahr. 3212;248(25): 2061-2068  (http://education.[facility].com/faq/FAQ164)          Passed - Triglycerides in normal range and within 360 days    Triglycerides  Date Value Ref Range Status  07/16/2021 128 <150 mg/dL Final         Passed - Patient is not pregnant      Passed - Valid encounter within last 12 months    Recent Outpatient Visits          2 months ago Depression, major, in remission [facility])   [facility] [name], MD   4 months ago Well adult exam   [facility] [name], MD   8 months ago Essential hypertension   [facility] [name], MD   1 year ago Lichen sclerosus   [facility] [name], MD   1 year ago B12 deficiency   [facility] [name], MD      Future Appointments            In 3 months Carlynn Purl, Danna Hefty, MD [facility], [facility]   In 8 months [name], MD [facility], [facility]

## 2021-12-04 ENCOUNTER — Other Ambulatory Visit: Payer: Self-pay | Admitting: Family Medicine

## 2021-12-04 DIAGNOSIS — J302 Other seasonal allergic rhinitis: Secondary | ICD-10-CM

## 2021-12-04 NOTE — Telephone Encounter (Signed)
Requested Prescriptions  Pending Prescriptions Disp Refills   fluticasone (FLONASE) 50 MCG/ACT nasal spray [Pharmacy Med Name: FLUTICASONE PROP 50 MCG SPRAY] 48 mL 1    Sig: SPRAY 2 SPRAYS INTO EACH NOSTRIL EVERY DAY     Ear, Nose, and Throat: Nasal Preparations - Corticosteroids Passed - 12/04/2021 12:08 PM      Passed - Valid encounter within last 12 months    Recent Outpatient Visits          3 months ago Depression, major, in remission [facility])   [facility] [name], MD   4 months ago Well adult exam   [facility] [name], MD   9 months ago Essential hypertension   [facility] [name], MD   1 year ago Lichen sclerosus   [facility] [name], MD   1 year ago B12 deficiency   [facility] [name], MD      Future Appointments            In 2 months Carlynn Purl, Danna Hefty, MD [facility], [facility]   In 7 months [name], MD [facility], [facility]

## 2022-03-02 NOTE — Progress Notes (Signed)
Name: Christie Mcdonald   MRN: [id number]    DOB: [birth year]   Date:03/03/2022 ? ?     Progress Note ? ?Subjective ? ?Chief Complaint ? ?Follow up  ? ?HPI ? ?HTN: she is taking medication as prescribed she is back on Atenolol and Exforge She denies dizziness, chest pain or palpitation. BP is at goal  ?  ?Lichen Planus: diagnosed by dermatologist 7 years ago, on arms legs and feet, raised dark spots, some flat and some raised, mild pruritis at times, she uses topical steroids prn , symptoms are under control , she noticed a couple of lesions recently on her leg.  ?  ?Hyperglycemia/pre-diabetes : she denies polydipsia , polyuria or polyphagia, last hgbA1C was stable at 6.1%. Reminded her to follow a low carbohydrate diet  ? ?[place] Depression/Anxiety: she has a long history of anxiety and also depression, states that since around July 2018 she felt much worse with no motivation or energy. Changes have been, father died April 2018, husband lost his job for 2 months, since Dec brother in law diagnosed with lung cancer and died June 2019. She states she never tried Hydroxyzine. She states since 2020 she has been feeling better. She wakes up during the night at times and her mind is busy but not very worried when she is awake. Discussed mindfulness meditation.  ?   ?Vitamin D and B12 deficiency: she is taking supplementation. ? ?Morbid obesity: BMI over 35 with co-morbidities such at HTN, dyslipidemia, dysthymia. She is monitoring her weight at home and is around 180 lbs. Discussed going back to the gym to be able to do some water activity, strength training  ?  ?Dyslipidemia: she is currently taking Crestor half pill three times a week. Last LDL improved, LDL down from 110 to 93 . She has been trying to eat more fish, discussed eating more tree nuts ? ?The 10-year ASCVD risk score (Arnett DK, et al., 2019) is: 5.1% ?  Values used to calculate the score: ?    Age: 61 years ?    Sex: Female ?    Is Non-Hispanic African  American: No ?    Diabetic: No ?    Tobacco smoker: No ?    Systolic Blood Pressure: 126 mmHg ?    Is BP treated: Yes ?    HDL Cholesterol: 35 mg/dL ?    Total Cholesterol: 150 mg/dL  ? ?Tension headaches: taking baclofen for that and also for body aches, usually tightness and stiff on her nuchal area, no photophobia, phonophobia. She needs a refill of baclofen  ? ?Patient Active Problem List  ? Diagnosis Date Noted  ? Low vitamin B12 level 08/17/2018  ? Systolic ejection murmur 01/16/2018  ? Moderate recurrent major depression (HCC) 09/19/2017  ? Gastro-esophageal reflux disease without esophagitis 09/20/2016  ? Menopause 09/20/2016  ? Temporomandibular joint-pain-dysfunction syndrome 09/20/2016  ? Hyperglycemia 09/20/2016  ? Vitamin D deficiency 12/22/2015  ? Hypertension 04/23/2015  ? Hyperlipemia 04/23/2015  ? ? ?Past Surgical History:  ?Procedure Laterality Date  ? ABDOMINAL HYSTERECTOMY  1991  ? BREAST BIOPSY Left 1997  ? negative  ? COLONOSCOPY WITH PROPOFOL N/A 06/10/2015  ? Procedure: COLONOSCOPY WITH PROPOFOL;  Surgeon: Kieth BrightlySeeplaputhur G Sankar, MD;  Location: [facility] ENDOSCOPY;  Service: Endoscopy;  Laterality: N/A;  ? HEMORRHOID SURGERY  1988  ? Dr Lorelee Newimothy Davis  ? ? ?Family History  ?Problem Relation Age of Onset  ? Diabetes Mother   ? Alzheimer's disease Mother   ?  Heart disease Mother   ?     CHF  ? Stroke Mother   ? Dementia Mother   ? Stroke Father   ? Kidney disease Father   ? Parkinson's disease Father   ? Hypertension Sister   ? Diabetes Sister   ? Diabetes Maternal Grandmother   ? Breast cancer Paternal Grandfather   ? Cancer Paternal Grandfather   ?     Breast  ? Breast cancer Cousin   ?     maternal-twice  ? ? ?Social History  ? ?Tobacco Use  ? Smoking status: Former  ?  Types: Cigarettes  ?  Quit date: 06/26/2014  ?  Years since quitting: 7.6  ? Smokeless tobacco: Never  ?Substance Use Topics  ? Alcohol use: No  ?  Alcohol/week: 0.0 standard drinks  ? ? ? ?Current Outpatient Medications:  ?   amLODipine-valsartan (EXFORGE) 5-160 MG tablet, TAKE 1 TABLET BY MOUTH EVERY DAY, Disp: 90 tablet, Rfl: 1 ?  atenolol (TENORMIN) 25 MG tablet, Take 0.5 tablets (12.5 mg total) by mouth daily., Disp: 45 tablet, Rfl: 1 ?  baclofen (LIORESAL) 10 MG tablet, Take 1 tablet (10 mg total) by mouth daily as needed for muscle spasms., Disp: 90 tablet, Rfl: 1 ?  Cholecalciferol (VITAMIN D3) 5000 UNITS CAPS, Take 1 capsule by mouth daily., Disp: , Rfl:  ?  Cyanocobalamin (B-12 SL), Place 1 drop under the tongue daily., Disp: , Rfl:  ?  fluticasone (FLONASE) 50 MCG/ACT nasal spray, SPRAY 2 SPRAYS INTO EACH NOSTRIL EVERY DAY, Disp: 48 mL, Rfl: 1 ?  rosuvastatin (CRESTOR) 5 MG tablet, TAKE 1 TABLET BY MOUTH 2 TIMES A WEEK, Disp: 90 tablet, Rfl: 1 ?  triamcinolone ointment (KENALOG) 0.1 %, APPLY ON AFFECTED AREA TWICE DAILY AS NEEDED., Disp: , Rfl:  ? ?Allergies  ?Allergen Reactions  ? Hydrochlorothiazide Other (See Comments)  ? Lisinopril Cough  ? ? ?I personally reviewed active problem list, medication list, allergies, family history, social history, health maintenance with the patient/caregiver today. ? ? ?ROS ? ?Constitutional: Negative for fever or weight change.  ?Respiratory: Negative for cough and shortness of breath.   ?Cardiovascular: Negative for chest pain or palpitations.  ?Gastrointestinal: Negative for abdominal pain, no bowel changes.  ?Musculoskeletal: Negative for gait problem or joint swelling.  ?Skin: Negative for rash.  ?Neurological: Negative for dizziness or headache.  ?No other specific complaints in a complete review of systems (except as listed in HPI above).  ? ?Objective ? ?Vitals:  ? 03/03/22 0900  ?BP: 126/74  ?Pulse: 74  ?Resp: 16  ?Temp: 97.7 ?F (36.5 ?C)  ?TempSrc: Oral  ?SpO2: 95%  ?Weight: 186 lb 6.4 oz (84.6 kg)  ?Height: 5\' 1"  (1.549 m)  ? ? ?Body mass index is 35.22 kg/m?. ? ?Physical Exam ? ?Constitutional: Patient appears well-developed and well-nourished. Obese  No distress.  ?HEENT: head  atraumatic, normocephalic, pupils equal and reactive to light, neck supple ?Cardiovascular: Normal rate, regular rhythm and normal heart sounds.  No murmur heard. No BLE edema. ?Pulmonary/Chest: Effort normal and breath sounds normal. No respiratory distress. ?Abdominal: Soft.  There is no tenderness. ?Psychiatric: Patient has a normal mood and affect. behavior is normal. Judgment and thought content normal.  ? ?PHQ2/9: ? ?  03/03/2022  ?  9:04 AM 09/02/2021  ?  9:48 AM 07/15/2021  ?  9:42 AM 03/02/2021  ?  9:31 AM 08/25/2020  ? 11:01 AM  ?Depression screen PHQ 2/9  ?Decreased Interest 0 0 0 0  0  ?Down, Depressed, Hopeless 0 0 0 0 0  ?PHQ - 2 Score 0 0 0 0 0  ?Altered sleeping 2 0 0 0 0  ?Tired, decreased energy 0 0 0 0 0  ?Change in appetite 0 0 0 0 0  ?Feeling bad or failure about yourself  0 0 0 0 0  ?Trouble concentrating 0 0 0 0 0  ?Moving slowly or fidgety/restless 0 0 0 0 0  ?Suicidal thoughts 0 0 0 0 0  ?PHQ-9 Score 2 0 0 0 0  ?Difficult doing work/chores Somewhat difficult      ?  ?phq 9 is negative ? ? ?Fall Risk: ? ?  03/03/2022  ?  9:02 AM 09/02/2021  ?  9:48 AM 07/15/2021  ?  9:42 AM 03/02/2021  ?  9:31 AM 08/25/2020  ? 11:00 AM  ?Fall Risk   ?Falls in the past year? 0 0 0 0 0  ?Number falls in past yr:   0 0 0  ?Injury with Fall?   0 0 0  ?Risk for fall due to : No Fall Risks  No Fall Risks    ?Follow up Falls prevention discussed Falls prevention discussed Falls prevention discussed    ? ? ? ? ?Functional Status Survey: ?Is the patient deaf or have difficulty hearing?: No ?Does the patient have difficulty seeing, even when wearing glasses/contacts?: No ?Does the patient have difficulty concentrating, remembering, or making decisions?: No ?Does the patient have difficulty walking or climbing stairs?: No ?Does the patient have difficulty dressing or bathing?: No ?Does the patient have difficulty doing errands alone such as visiting a doctor's office or shopping?: No ? ? ? ?Assessment & Plan ? ?1. Depression,  major, in remission (HCC) ? ?Doing well  ? ?2. Morbid obesity (HCC) ? ?Discussed with the patient the risk posed by an increased BMI. Discussed importance of portion control, calorie counting and at least 150 minutes of

## 2022-03-03 ENCOUNTER — Other Ambulatory Visit: Payer: Self-pay | Admitting: Family Medicine

## 2022-03-03 ENCOUNTER — Encounter: Payer: Self-pay | Admitting: Family Medicine

## 2022-03-03 ENCOUNTER — Ambulatory Visit (INDEPENDENT_AMBULATORY_CARE_PROVIDER_SITE_OTHER): Payer: 59 | Admitting: Family Medicine

## 2022-03-03 VITALS — BP 126/74 | HR 74 | Temp 97.7°F | Resp 16 | Ht 61.0 in | Wt 186.4 lb

## 2022-03-03 DIAGNOSIS — J302 Other seasonal allergic rhinitis: Secondary | ICD-10-CM

## 2022-03-03 DIAGNOSIS — M25551 Pain in right hip: Secondary | ICD-10-CM | POA: Diagnosis not present

## 2022-03-03 DIAGNOSIS — M62838 Other muscle spasm: Secondary | ICD-10-CM

## 2022-03-03 DIAGNOSIS — E538 Deficiency of other specified B group vitamins: Secondary | ICD-10-CM | POA: Diagnosis not present

## 2022-03-03 DIAGNOSIS — J3089 Other allergic rhinitis: Secondary | ICD-10-CM | POA: Diagnosis not present

## 2022-03-03 DIAGNOSIS — R69 Illness, unspecified: Secondary | ICD-10-CM | POA: Diagnosis not present

## 2022-03-03 DIAGNOSIS — F325 Major depressive disorder, single episode, in full remission: Secondary | ICD-10-CM | POA: Diagnosis not present

## 2022-03-03 DIAGNOSIS — G44209 Tension-type headache, unspecified, not intractable: Secondary | ICD-10-CM | POA: Diagnosis not present

## 2022-03-03 DIAGNOSIS — I10 Essential (primary) hypertension: Secondary | ICD-10-CM | POA: Diagnosis not present

## 2022-03-03 DIAGNOSIS — E559 Vitamin D deficiency, unspecified: Secondary | ICD-10-CM | POA: Diagnosis not present

## 2022-03-03 DIAGNOSIS — E785 Hyperlipidemia, unspecified: Secondary | ICD-10-CM

## 2022-03-03 DIAGNOSIS — F411 Generalized anxiety disorder: Secondary | ICD-10-CM

## 2022-03-03 DIAGNOSIS — G8929 Other chronic pain: Secondary | ICD-10-CM

## 2022-03-03 MED ORDER — ATENOLOL 25 MG PO TABS: 12.5000 mg | ORAL_TABLET | Freq: Every day | ORAL | 1 refills | Status: DC

## 2022-03-03 MED ORDER — AMLODIPINE BESYLATE-VALSARTAN 5-160 MG PO TABS: 1.0000 | ORAL_TABLET | Freq: Every day | ORAL | 1 refills | Status: DC

## 2022-03-03 MED ORDER — ROSUVASTATIN CALCIUM 5 MG PO TABS: 5.0000 mg | ORAL_TABLET | Freq: Every day | ORAL | 1 refills | Status: DC

## 2022-03-03 MED ORDER — BACLOFEN 5 MG PO TABS: 5.0000 mg | ORAL_TABLET | Freq: Every evening | ORAL | 1 refills | Status: DC | PRN

## 2022-03-03 NOTE — Patient Instructions (Signed)
May you be happy, may you be healthy, may you be safe  ?

## 2022-05-15 DIAGNOSIS — H60501 Unspecified acute noninfective otitis externa, right ear: Secondary | ICD-10-CM | POA: Diagnosis not present

## 2022-05-17 DIAGNOSIS — J019 Acute sinusitis, unspecified: Secondary | ICD-10-CM | POA: Diagnosis not present

## 2022-05-17 DIAGNOSIS — B9689 Other specified bacterial agents as the cause of diseases classified elsewhere: Secondary | ICD-10-CM | POA: Diagnosis not present

## 2022-05-28 ENCOUNTER — Other Ambulatory Visit: Payer: Self-pay | Admitting: Family Medicine

## 2022-05-28 DIAGNOSIS — J302 Other seasonal allergic rhinitis: Secondary | ICD-10-CM

## 2022-07-18 NOTE — Progress Notes (Unsigned)
Name: Christie Mcdonald   MRN: [id number]    DOB: November 17, 1960   Date:07/19/2022       Progress Note  Subjective  Chief Complaint  Annual Exam  HPI  Patient presents for annual CPE.  Diet: she eats healthy  Exercise: discussed regular physical activity   Last Eye Exam: last one this Summer  Last Dental Exam: every 6 months   [place] Visit from 08/25/2020 in [facility]  AUDIT-C Score 0      Depression: Phq 9 is  negative    07/19/2022    9:14 AM 03/03/2022    9:04 AM 09/02/2021    9:48 AM 07/15/2021    9:42 AM 03/02/2021    9:31 AM  Depression screen PHQ 2/9  Decreased Interest 0 0 0 0 0  Down, Depressed, Hopeless 0 0 0 0 0  PHQ - 2 Score 0 0 0 0 0  Altered sleeping 2 2 0 0 0  Tired, decreased energy 2 0 0 0 0  Change in appetite 0 0 0 0 0  Feeling bad or failure about yourself  0 0 0 0 0  Trouble concentrating 0 0 0 0 0  Moving slowly or fidgety/restless 0 0 0 0 0  Suicidal thoughts 0 0 0 0 0  PHQ-9 Score 4 2 0 0 0  Difficult doing work/chores  Somewhat difficult      Hypertension: BP Readings from Last 3 Encounters:  07/19/22 132/76  03/03/22 126/74  09/02/21 132/78   Obesity: Wt Readings from Last 3 Encounters:  07/19/22 185 lb (83.9 kg)  03/03/22 186 lb 6.4 oz (84.6 kg)  09/02/21 182 lb 6.4 oz (82.7 kg)   BMI Readings from Last 3 Encounters:  07/19/22 34.96 kg/m  03/03/22 35.22 kg/m  09/02/21 35.62 kg/m     Vaccines:   HPV: N/A Tdap: up to date Shingrix: discussed with patient  Pneumonia: N/A Flu: 2008, declines flu COVID-19: refused COVID   Hep C Screening: 09/22/16 STD testing and prevention (HIV/chl/gon/syphilis): Not interested  Intimate partner violence: negative screen  Sexual History : sexually active with husband  Menstrual History/LMP/Abnormal Bleeding: s/p hysterectomy  Discussed importance of follow up if any post-menopausal bleeding: N/A Incontinence Symptoms: negative for symptoms   Breast  cancer:  - Last Mammogram: 07/26/21 - BRCA gene screening: N/A  Osteoporosis Prevention : Discussed high calcium and vitamin D supplementation, weight bearing exercises Bone density: N/A  Cervical cancer screening: s/p hysterectomy   Skin cancer: Discussed monitoring for atypical lesions  Colorectal cancer: 07/15/15   Lung cancer:  Low Dose CT Chest recommended if Age 19-80 years, 20 pack-year currently smoking OR have quit w/in 15years. Patient does not qualify for screen   ECG: 02/26/13  Advanced Care Planning: A voluntary discussion about advance care planning including the explanation and discussion of advance directives.  Discussed health care proxy and Living will, and the patient was able to identify a health care proxy as husband .  Patient does not have a living will and power of attorney of health care   Lipids: Lab Results  Component Value Date   CHOL 150 07/16/2021   CHOL 183 08/25/2020   CHOL 176 08/06/2019   Lab Results  Component Value Date   HDL 35 (L) 07/16/2021   HDL 35 (L) 08/25/2020   HDL 33 (L) 08/06/2019   Lab Results  Component Value Date   LDLCALC 93 07/16/2021   LDLCALC 110 (H) 08/25/2020   [place]  112 (H) 08/06/2019   Lab Results  Component Value Date   TRIG 128 07/16/2021   TRIG 266 (H) 08/25/2020   TRIG 185 (H) 08/06/2019   Lab Results  Component Value Date   CHOLHDL 4.3 07/16/2021   CHOLHDL 5.2 (H) 08/25/2020   CHOLHDL 5.3 (H) 08/06/2019   No results found for: "LDLDIRECT"  Glucose: Glucose  Date Value Ref Range Status  02/27/2013 111 (H) 65 - 99 mg/dL Final  02/27/2013 114 (H) 65 - 99 mg/dL Final  02/26/2013 110 (H) 65 - 99 mg/dL Final   Glucose, Bld  Date Value Ref Range Status  07/16/2021 111 (H) 65 - 99 mg/dL Final    Comment:    .            Fasting reference interval . For someone without known diabetes, a glucose value between 100 and 125 mg/dL is consistent with prediabetes and should be confirmed with a follow-up  test. .   08/25/2020 94 65 - 99 mg/dL Final    Comment:    .            Fasting reference interval .   08/06/2019 107 (H) 65 - 99 mg/dL Final    Comment:    .            Fasting reference interval . For someone without known diabetes, a glucose value between 100 and 125 mg/dL is consistent with prediabetes and should be confirmed with a follow-up test. .     Patient Active Problem List   Diagnosis Date Noted   Morbid obesity ([place]) 01/15/2019   Low vitamin B12 level 69/62/9528   Systolic ejection murmur 41/32/4401   Moderate recurrent major depression ([place]) 09/19/2017   Gastro-esophageal reflux disease without esophagitis 09/20/2016   Menopause 09/20/2016   Temporomandibular joint-pain-dysfunction syndrome 09/20/2016   Hyperglycemia 09/20/2016   Vitamin D deficiency 12/22/2015   Hypertension 04/23/2015   Hyperlipemia 04/23/2015    Past Surgical History:  Procedure Laterality Date   ABDOMINAL HYSTERECTOMY  1991   BREAST BIOPSY Left 1997   negative   COLONOSCOPY WITH PROPOFOL N/A 06/10/2015   Procedure: COLONOSCOPY WITH PROPOFOL;  Surgeon: Christene Lye, MD;  Location: [facility] ENDOSCOPY;  Service: Endoscopy;  Laterality: N/A;   HEMORRHOID SURGERY  1988   Dr Annabell Sabal    Family History  Problem Relation Age of Onset   Diabetes Mother    Alzheimer's disease Mother    Heart disease Mother        CHF   Stroke Mother    Dementia Mother    Stroke Father    Kidney disease Father    Parkinson's disease Father    Hypertension Sister    Diabetes Sister    Diabetes Maternal Grandmother    Breast cancer Paternal Grandfather    Cancer Paternal Grandfather        Breast   Breast cancer Cousin        maternal-twice    Social History   Socioeconomic History   Marital status: Married    Spouse name: "Nada Boozer"   Number of children: 2   Years of education: Not on file   Highest education level: 12th grade  Occupational History   Not on file  Tobacco Use    Smoking status: Former    Types: Cigarettes    Quit date: 06/26/2014    Years since quitting: 8.0   Smokeless tobacco: Never  Vaping Use   Vaping Use: Never used  Substance  and Sexual Activity   Alcohol use: No    Alcohol/week: 0.0 standard drinks of alcohol   Drug use: No   Sexual activity: Yes    Partners: Male    Birth control/protection: None  Other Topics Concern   Not on file  Social History Narrative   Not on file   Social Determinants of Health   Financial Resource Strain: Low Risk  (07/19/2022)   Overall Financial Resource Strain (CARDIA)    Difficulty of Paying Living Expenses: Not hard at all  Food Insecurity: No Food Insecurity (07/19/2022)   Hunger Vital Sign    Worried About Running Out of Food in the Last Year: Never true    Ran Out of Food in the Last Year: Never true  Transportation Needs: No Transportation Needs (07/19/2022)   PRAPARE - Hydrologist (Medical): No    Lack of Transportation (Non-Medical): No  Physical Activity: Inactive (07/19/2022)   Exercise Vital Sign    Days of Exercise per Week: 0 days    Minutes of Exercise per Session: 0 min  Stress: Stress Concern Present (07/19/2022)   [place]    Feeling of Stress : Very much  Social Connections: Moderately Integrated (07/19/2022)   Social Connection and Isolation Panel [NHANES]    Frequency of Communication with Friends and Family: More than three times a week    Frequency of Social Gatherings with Friends and Family: Three times a week    Attends Religious Services: More than 4 times per year    Active Member of Clubs or Organizations: No    Attends Archivist Meetings: Never    Marital Status: Married  Human resources officer Violence: Not At Risk (07/19/2022)   Humiliation, Afraid, Rape, and Kick questionnaire    Fear of Current or Ex-Partner: No    Emotionally Abused: No    Physically Abused: No     Sexually Abused: No     Current Outpatient Medications:    amLODipine-valsartan (EXFORGE) 5-160 MG tablet, Take 1 tablet by mouth daily., Disp: 90 tablet, Rfl: 1   atenolol (TENORMIN) 25 MG tablet, Take 0.5 tablets (12.5 mg total) by mouth daily., Disp: 45 tablet, Rfl: 1   baclofen 5 MG TABS, Take 5 mg by mouth at bedtime as needed for muscle spasms., Disp: 90 tablet, Rfl: 1   Cholecalciferol (VITAMIN D3) 5000 UNITS CAPS, Take 1 capsule by mouth daily., Disp: , Rfl:    Cyanocobalamin (B-12 SL), Place 1 drop under the tongue daily., Disp: , Rfl:    fluticasone (FLONASE) 50 MCG/ACT nasal spray, SPRAY 2 SPRAYS INTO EACH NOSTRIL EVERY DAY, Disp: 16 mL, Rfl: 5   rosuvastatin (CRESTOR) 5 MG tablet, Take 1 tablet (5 mg total) by mouth daily., Disp: 90 tablet, Rfl: 1   triamcinolone ointment (KENALOG) 0.1 %, APPLY ON AFFECTED AREA TWICE DAILY AS NEEDED., Disp: , Rfl:   Allergies  Allergen Reactions   Hydrochlorothiazide Other (See Comments)   Lisinopril Cough     ROS  Constitutional: Negative for fever or weight change.  Respiratory: Negative for cough and shortness of breath.   Cardiovascular: Negative for chest pain or palpitations.  Gastrointestinal: Negative for abdominal pain, no bowel changes.  Musculoskeletal: Negative for gait problem or joint swelling.  Skin: Negative for rash.  Neurological: Negative for dizziness or headache.  No other specific complaints in a complete review of systems (except as listed in HPI above).  Objective  Vitals:   07/19/22 0914  BP: 132/76  Pulse: 79  Resp: 16  SpO2: 94%  Weight: 185 lb (83.9 kg)  Height: '5\' 1"'  (1.549 m)    Body mass index is 34.96 kg/m.  Physical Exam  Constitutional: Patient appears well-developed and well-nourished. No distress.  HENT: Head: Normocephalic and atraumatic. Ears: B TMs ok, no erythema or effusion; Nose: Nose normal. Mouth/Throat: not done  Eyes: Conjunctivae and EOM are normal. Pupils are equal,  round, and reactive to light. No scleral icterus.  Neck: Normal range of motion. Neck supple. No JVD present. No thyromegaly present.  Cardiovascular: Normal rate, regular rhythm and normal heart sounds.  No murmur heard. No BLE edema. Pulmonary/Chest: Effort normal and breath sounds normal. No respiratory distress. Abdominal: Soft. Bowel sounds are normal, no distension. There is no tenderness. no masses Breast: no lumps or masses, no nipple discharge or rashes FEMALE GENITALIA:  Not done  RECTAL: not done  Musculoskeletal: Normal range of motion, no joint effusions. No gross deformities Neurological: he is alert and oriented to person, place, and time. No cranial nerve deficit. Coordination, balance, strength, speech and gait are normal.  Skin: Skin is warm and dry. No rash noted. No erythema.  Psychiatric: Patient has a normal mood and affect. behavior is normal. Judgment and thought content normal.    Fall Risk:    07/19/2022    9:14 AM 03/03/2022    9:02 AM 09/02/2021    9:48 AM 07/15/2021    9:42 AM 03/02/2021    9:31 AM  [place] in the past year? 0 0 0 0 0  Number falls in past yr: 0   0 0  Injury with Fall? 0   0 0  Risk for fall due to : No Fall Risks No Fall Risks  No Fall Risks   Follow up Falls prevention discussed Falls prevention discussed Falls prevention discussed Falls prevention discussed      Functional Status Survey: Is the patient deaf or have difficulty hearing?: No Does the patient have difficulty seeing, even when wearing glasses/contacts?: No Does the patient have difficulty concentrating, remembering, or making decisions?: No Does the patient have difficulty walking or climbing stairs?: No Does the patient have difficulty dressing or bathing?: No Does the patient have difficulty doing errands alone such as visiting a doctor's office or shopping?: No   Assessment & Plan  1. Well adult exam  - Lipid panel - CBC with Differential/Platelet -  COMPLETE METABOLIC PANEL WITH GFR - Vitamin B12 - VITAMIN D 25 Hydroxy (Vit-D Deficiency, Fractures) - TSH - Hemoglobin A1c  2. Breast cancer screening by mammogram  - MM 3D SCREEN BREAST BILATERAL; Future  3. Need for shingles vaccine  - Zoster Vaccine Adjuvanted [facility]) injection; Inject 0.5 mLs into the muscle once for 1 dose.  Dispense: 0.5 mL; Refill: 0  4. Dyslipidemia (high LDL; low HDL)  - Lipid panel  5. Vitamin D deficiency  - VITAMIN D 25 Hydroxy (Vit-D Deficiency, Fractures)  6. B12 deficiency  - Vitamin B12  7. Pre-diabetes  - Hemoglobin A1c  8. Other fatigue  - CBC with Differential/Platelet - TSH  9. Long-term use of high-risk medication  - CBC with Differential/Platelet - COMPLETE METABOLIC PANEL WITH GFR    -USPSTF grade A and B recommendations reviewed with patient; age-appropriate recommendations, preventive care, screening tests, etc discussed and encouraged; healthy living encouraged; see AVS for patient education given to patient -Discussed importance of  150 minutes of physical activity weekly, eat two servings of fish weekly, eat one serving of tree nuts ( cashews, pistachios, pecans, almonds.Marland Kitchen) every other day, eat 6 servings of fruit/vegetables daily and drink plenty of water and avoid sweet beverages.   -Reviewed Health Maintenance: Yes.

## 2022-07-18 NOTE — Patient Instructions (Signed)
Preventive Care 40-62 Years Old, Female Preventive care refers to lifestyle choices and visits with your health care provider that can promote health and wellness. Preventive care visits are also called wellness exams. What can I expect for my preventive care visit? Counseling Your health care provider may ask you questions about your: Medical history, including: Past medical problems. Family medical history. Pregnancy history. Current health, including: Menstrual cycle. Method of birth control. Emotional well-being. Home life and relationship well-being. Sexual activity and sexual health. Lifestyle, including: Alcohol, nicotine or tobacco, and drug use. Access to firearms. Diet, exercise, and sleep habits. Work and work environment. Sunscreen use. Safety issues such as seatbelt and bike helmet use. Physical exam Your health care provider will check your: Height and weight. These may be used to calculate your BMI (body mass index). BMI is a measurement that tells if you are at a healthy weight. Waist circumference. This measures the distance around your waistline. This measurement also tells if you are at a healthy weight and may help predict your risk of certain diseases, such as type 2 diabetes and high blood pressure. Heart rate and blood pressure. Body temperature. Skin for abnormal spots. What immunizations do I need?  Vaccines are usually given at various ages, according to a schedule. Your health care provider will recommend vaccines for you based on your age, medical history, and lifestyle or other factors, such as travel or where you work. What tests do I need? Screening Your health care provider may recommend screening tests for certain conditions. This may include: Lipid and cholesterol levels. Diabetes screening. This is done by checking your blood sugar (glucose) after you have not eaten for a while (fasting). Pelvic exam and Pap test. Hepatitis B test. Hepatitis C  test. HIV (human immunodeficiency virus) test. STI (sexually transmitted infection) testing, if you are at risk. Lung cancer screening. Colorectal cancer screening. Mammogram. Talk with your health care provider about when you should start having regular mammograms. This may depend on whether you have a family history of breast cancer. BRCA-related cancer screening. This may be done if you have a family history of breast, ovarian, tubal, or peritoneal cancers. Bone density scan. This is done to screen for osteoporosis. Talk with your health care provider about your test results, treatment options, and if necessary, the need for more tests. Follow these instructions at home: Eating and drinking  Eat a diet that includes fresh fruits and vegetables, whole grains, lean protein, and low-fat dairy products. Take vitamin and mineral supplements as recommended by your health care provider. Do not drink alcohol if: Your health care provider tells you not to drink. You are pregnant, may be pregnant, or are planning to become pregnant. If you drink alcohol: Limit how much you have to 0-1 drink a day. Know how much alcohol is in your drink. In the [place] one drink equals one 12 oz bottle of beer (355 mL), one 5 oz glass of wine (148 mL), or one 1 oz glass of hard liquor (44 mL). Lifestyle Brush your teeth every morning and night with fluoride toothpaste. Floss one time each day. Exercise for at least 30 minutes 5 or more days each week. Do not use any products that contain nicotine or tobacco. These products include cigarettes, chewing tobacco, and vaping devices, such as e-cigarettes. If you need help quitting, ask your health care provider. Do not use drugs. If you are sexually active, practice safe sex. Use a condom or other form of protection to   prevent STIs. If you do not wish to become pregnant, use a form of birth control. If you plan to become pregnant, see your health care provider for a  prepregnancy visit. Take aspirin only as told by your health care provider. Make sure that you understand how much to take and what form to take. Work with your health care provider to find out whether it is safe and beneficial for you to take aspirin daily. Find healthy ways to manage stress, such as: Meditation, yoga, or listening to music. Journaling. Talking to a trusted person. Spending time with friends and family. Minimize exposure to UV radiation to reduce your risk of skin cancer. Safety Always wear your seat belt while driving or riding in a vehicle. Do not drive: If you have been drinking alcohol. Do not ride with someone who has been drinking. When you are tired or distracted. While texting. If you have been using any mind-altering substances or drugs. Wear a helmet and other protective equipment during sports activities. If you have firearms in your house, make sure you follow all gun safety procedures. Seek help if you have been physically or sexually abused. What's next? Visit your health care provider once a year for an annual wellness visit. Ask your health care provider how often you should have your eyes and teeth checked. Stay up to date on all vaccines. This information is not intended to replace advice given to you by your health care provider. Make sure you discuss any questions you have with your health care provider. Document Revised: 04/21/2021 Document Reviewed: 04/21/2021 Elsevier Patient Education  [place].

## 2022-07-19 ENCOUNTER — Ambulatory Visit (INDEPENDENT_AMBULATORY_CARE_PROVIDER_SITE_OTHER): Payer: 59 | Admitting: Family Medicine

## 2022-07-19 ENCOUNTER — Encounter: Payer: Self-pay | Admitting: Family Medicine

## 2022-07-19 VITALS — BP 132/76 | HR 79 | Resp 16 | Ht 61.0 in | Wt 185.0 lb

## 2022-07-19 DIAGNOSIS — Z1231 Encounter for screening mammogram for malignant neoplasm of breast: Secondary | ICD-10-CM

## 2022-07-19 DIAGNOSIS — E785 Hyperlipidemia, unspecified: Secondary | ICD-10-CM

## 2022-07-19 DIAGNOSIS — Z79899 Other long term (current) drug therapy: Secondary | ICD-10-CM

## 2022-07-19 DIAGNOSIS — R5383 Other fatigue: Secondary | ICD-10-CM

## 2022-07-19 DIAGNOSIS — E559 Vitamin D deficiency, unspecified: Secondary | ICD-10-CM

## 2022-07-19 DIAGNOSIS — Z23 Encounter for immunization: Secondary | ICD-10-CM | POA: Diagnosis not present

## 2022-07-19 DIAGNOSIS — R7303 Prediabetes: Secondary | ICD-10-CM

## 2022-07-19 DIAGNOSIS — E538 Deficiency of other specified B group vitamins: Secondary | ICD-10-CM | POA: Diagnosis not present

## 2022-07-19 DIAGNOSIS — Z0000 Encounter for general adult medical examination without abnormal findings: Secondary | ICD-10-CM

## 2022-07-19 MED ORDER — SHINGRIX 50 MCG/0.5ML IM SUSR: 0.5000 mL | Freq: Once | INTRAMUSCULAR | 0 refills | Status: AC

## 2022-07-20 LAB — VITAMIN B12: Vitamin B-12: 897 pg/mL (ref 200–1100)

## 2022-07-20 LAB — COMPLETE METABOLIC PANEL WITH GFR
AG Ratio: 1.5 (calc) (ref 1.0–2.5)
ALT: 19 U/L (ref 6–29)
AST: 20 U/L (ref 10–35)
Albumin: 4.6 g/dL (ref 3.6–5.1)
Alkaline phosphatase (APISO): 84 U/L (ref 37–153)
BUN: 13 mg/dL (ref 7–25)
CO2: 28 mmol/L (ref 20–32)
Calcium: 9.3 mg/dL (ref 8.6–10.4)
Chloride: 97 mmol/L — ABNORMAL LOW (ref 98–110)
Creat: 0.75 mg/dL (ref 0.50–1.05)
Globulin: 3 g/dL (calc) (ref 1.9–3.7)
Glucose, Bld: 105 mg/dL — ABNORMAL HIGH (ref 65–99)
Potassium: 4.4 mmol/L (ref 3.5–5.3)
Sodium: 136 mmol/L (ref 135–146)
Total Bilirubin: 0.5 mg/dL (ref 0.2–1.2)
Total Protein: 7.6 g/dL (ref 6.1–8.1)
eGFR: 91 mL/min/{1.73_m2} (ref >=60)

## 2022-07-20 LAB — HEMOGLOBIN A1C
Hgb A1c MFr Bld: 6 % of total Hgb — ABNORMAL HIGH (ref <5.7)
Mean Plasma Glucose: 126 mg/dL
eAG (mmol/L): 7 mmol/L

## 2022-07-20 LAB — LIPID PANEL
Cholesterol: 199 mg/dL (ref <200)
HDL: 38 mg/dL — ABNORMAL LOW (ref >=50)
LDL Cholesterol (Calc): 124 mg/dL (calc) — ABNORMAL HIGH
Non-HDL Cholesterol (Calc): 161 mg/dL (calc) — ABNORMAL HIGH (ref <130)
Total CHOL/HDL Ratio: 5.2 (calc) — ABNORMAL HIGH (ref <5.0)
Triglycerides: 229 mg/dL — ABNORMAL HIGH (ref <150)

## 2022-07-20 LAB — CBC WITH DIFFERENTIAL/PLATELET
Absolute Monocytes: 503 cells/uL (ref 200–950)
Basophils Absolute: 67 cells/uL (ref 0–200)
Basophils Relative: 1 %
Eosinophils Absolute: 248 cells/uL (ref 15–500)
Eosinophils Relative: 3.7 %
HCT: 38.9 % (ref 35.0–45.0)
Hemoglobin: 13.2 g/dL (ref 11.7–15.5)
Lymphs Abs: 1213 cells/uL (ref 850–3900)
MCH: 30.7 pg (ref 27.0–33.0)
MCHC: 33.9 g/dL (ref 32.0–36.0)
MCV: 90.5 fL (ref 80.0–100.0)
MPV: 10.9 fL (ref 7.5–12.5)
Monocytes Relative: 7.5 %
Neutro Abs: 4670 cells/uL (ref 1500–7800)
Neutrophils Relative %: 69.7 %
Platelets: 342 10*3/uL (ref 140–400)
RBC: 4.3 10*6/uL (ref 3.80–5.10)
RDW: 13.6 % (ref 11.0–15.0)
Total Lymphocyte: 18.1 %
WBC: 6.7 10*3/uL (ref 3.8–10.8)

## 2022-07-20 LAB — VITAMIN D 25 HYDROXY (VIT D DEFICIENCY, FRACTURES): Vit D, 25-Hydroxy: 87 ng/mL (ref 30–100)

## 2022-07-20 LAB — TSH: TSH: 1.81 mIU/L (ref 0.40–4.50)

## 2022-07-23 IMAGING — MG MM DIGITAL SCREENING BILAT W/ TOMO AND CAD
8 series · 8 of 24 positions shown · non-contrast
Comparison: Previous exam(s).

CLINICAL DATA: Screening.

EXAM:
DIGITAL SCREENING BILATERAL MAMMOGRAM WITH TOMOSYNTHESIS AND CAD
TECHNIQUE: Bilateral screening digital craniocaudal and mediolateral oblique
mammograms were obtained. Bilateral screening digital breast
tomosynthesis was performed. The images were evaluated with
computer-aided detection.

[L CC synth-2D]
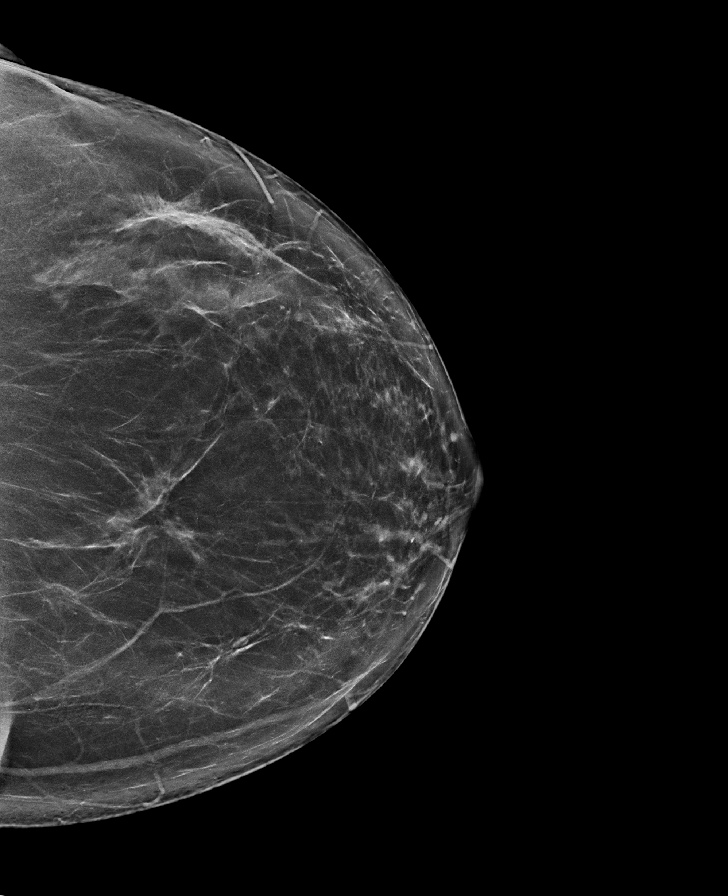

[R MLO synth-2D]
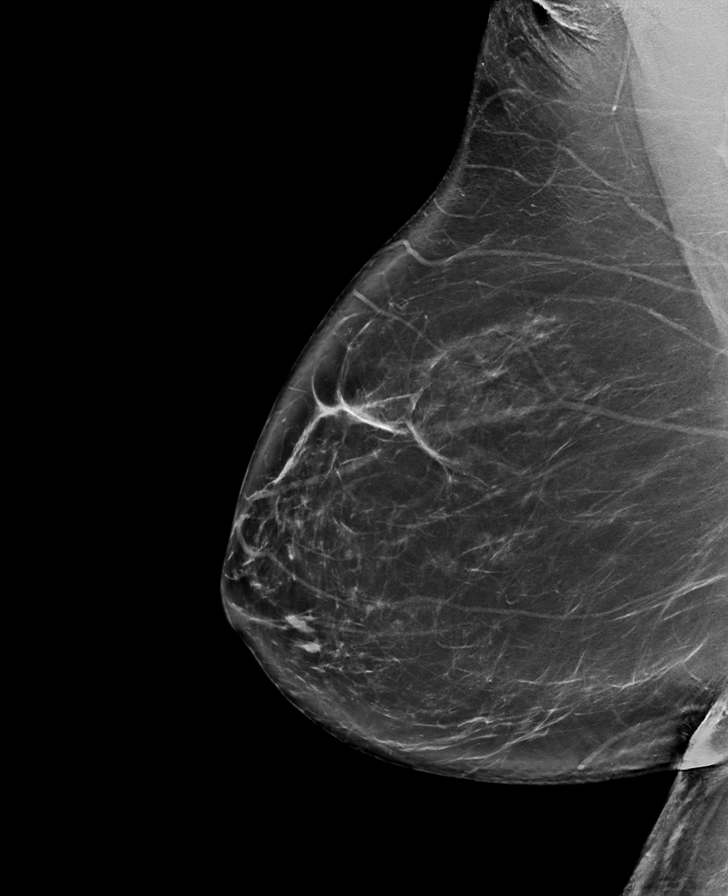

[L MLO synth-2D]
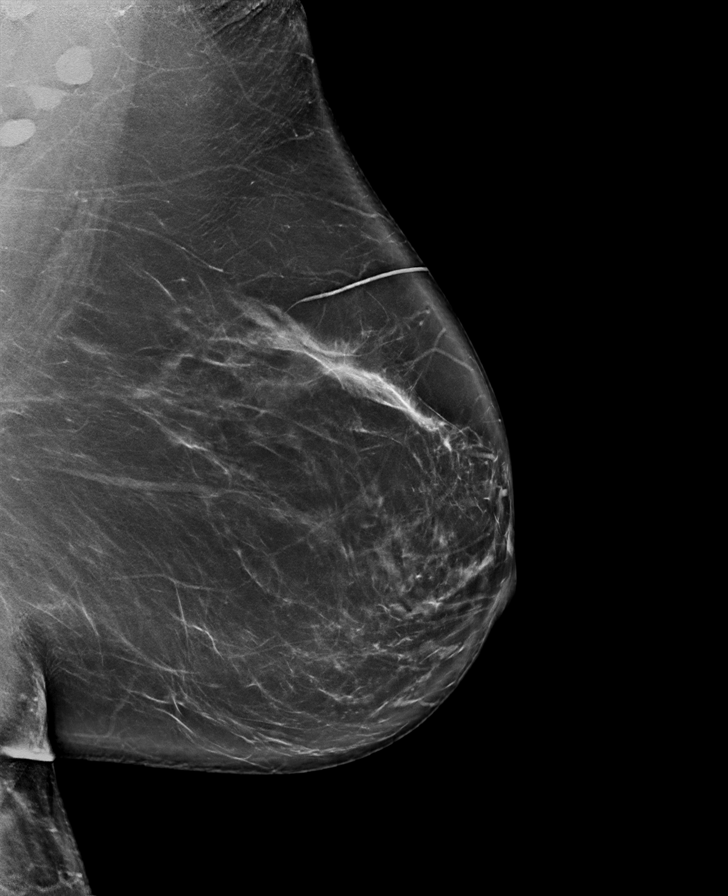

[R CC synth-2D]
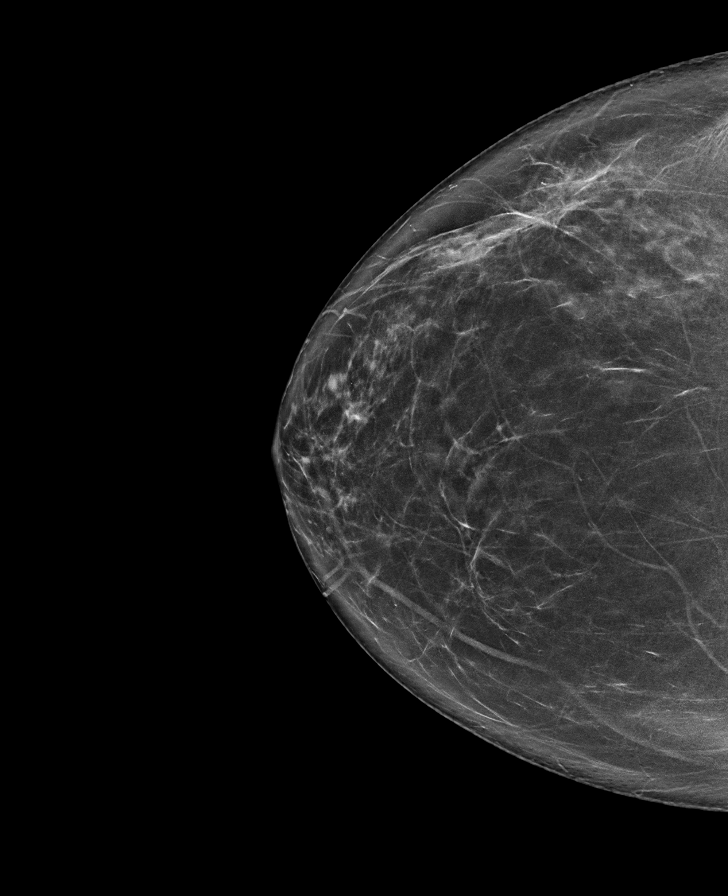

[L CC tomo · tomo slice 44/87.0]
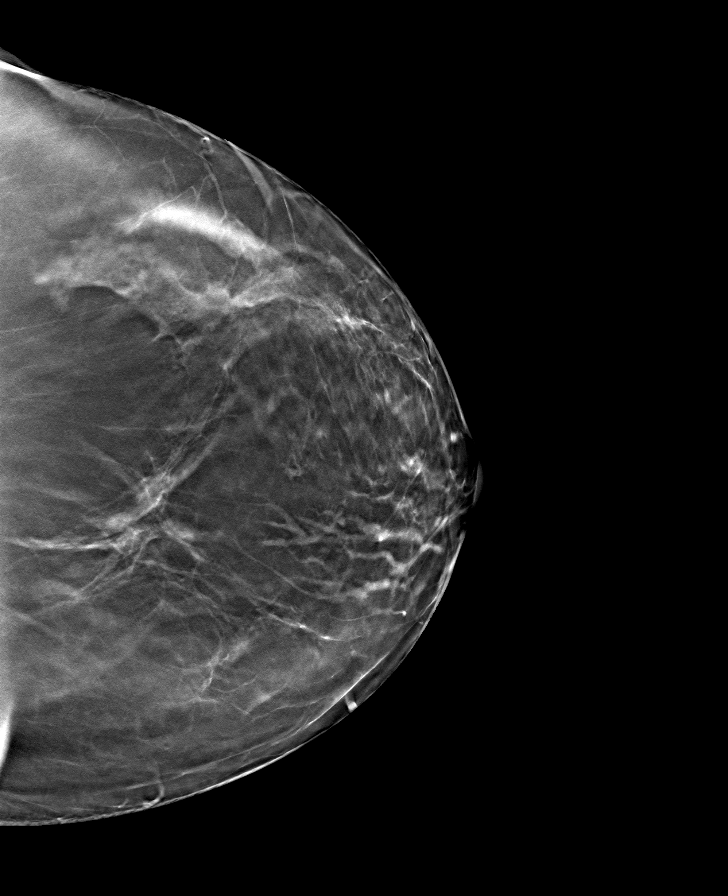

[L MLO tomo · tomo slice 50/99.0]
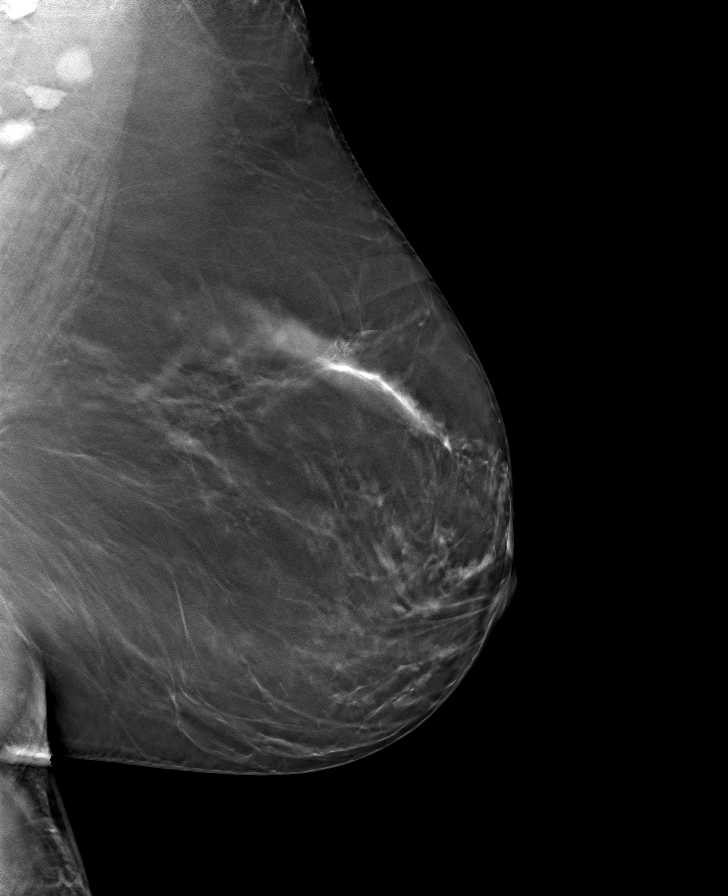

[R CC tomo · tomo slice 40/79.0]
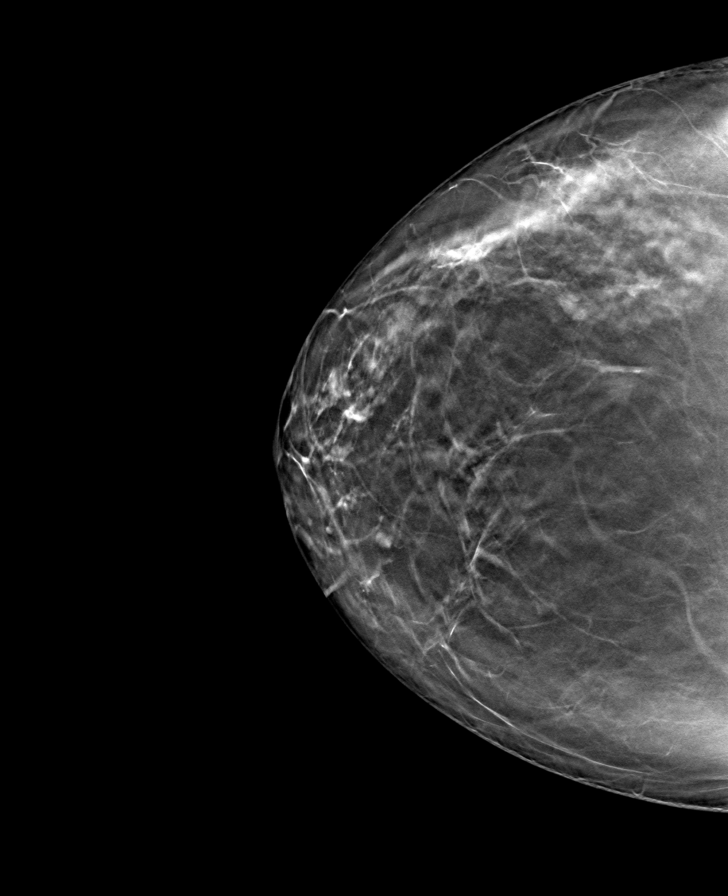

[R MLO tomo · tomo slice 48/95.0]
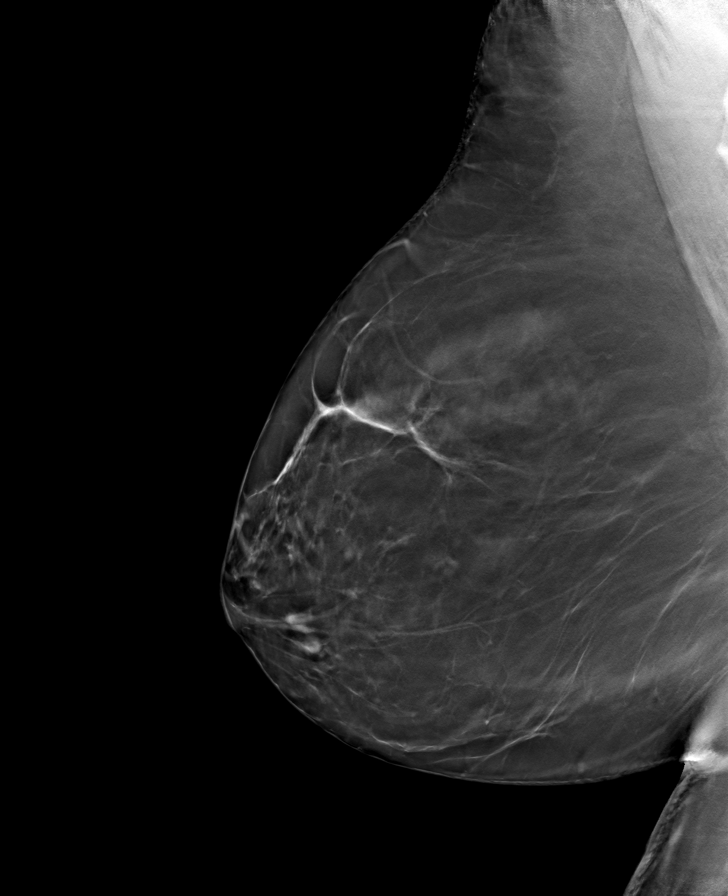

[8 of 24 positions shown; findings below may reference images not displayed]

ACR Breast Density Category b: There are scattered areas of
fibroglandular density.
FINDINGS: There are no findings suspicious for malignancy.
IMPRESSION: No mammographic evidence of malignancy. A result letter of this
screening mammogram will be mailed directly to the patient.

RECOMMENDATION:
Screening mammogram in one year. (Code:51-O-LD2)

BI-RADS CATEGORY  1: Negative.

## 2022-08-18 ENCOUNTER — Ambulatory Visit
Admission: RE | Admit: 2022-08-18 | Discharge: 2022-08-18 | Disposition: A | Discharge status: Home / Self Care | Payer: 59 | Source: Ambulatory Visit | Attending: Family Medicine | Admitting: Family Medicine

## 2022-08-18 DIAGNOSIS — Z1231 Encounter for screening mammogram for malignant neoplasm of breast: Secondary | ICD-10-CM | POA: Diagnosis not present

## 2022-09-04 ENCOUNTER — Other Ambulatory Visit: Payer: Self-pay | Admitting: Family Medicine

## 2022-09-04 DIAGNOSIS — I10 Essential (primary) hypertension: Secondary | ICD-10-CM

## 2022-09-13 ENCOUNTER — Other Ambulatory Visit: Payer: Self-pay | Admitting: Family Medicine

## 2022-09-13 DIAGNOSIS — E785 Hyperlipidemia, unspecified: Secondary | ICD-10-CM

## 2022-09-13 DIAGNOSIS — I10 Essential (primary) hypertension: Secondary | ICD-10-CM

## 2022-09-15 ENCOUNTER — Ambulatory Visit: Payer: 59 | Admitting: Family Medicine

## 2022-10-05 ENCOUNTER — Other Ambulatory Visit: Payer: Self-pay | Admitting: Family Medicine

## 2022-10-05 DIAGNOSIS — E785 Hyperlipidemia, unspecified: Secondary | ICD-10-CM

## 2022-10-05 DIAGNOSIS — I10 Essential (primary) hypertension: Secondary | ICD-10-CM

## 2022-10-18 NOTE — Progress Notes (Unsigned)
Name: Christie Mcdonald   MRN: [id number]    DOB: January 02, 1961   Date:10/19/2022       Progress Note  Subjective  Chief Complaint  Follow Up  HPI  HTN: she is taking medication as prescribed she is back on Atenolol and Exforge She denies dizziness, chest pain or palpitation. She denies side effects of medication    Lichen Planus: diagnosed by dermatologist 7 years ago, on arms legs and feet, raised dark spots, some flat and some raised, mild pruritis at times, she uses topical steroids prn , symptoms are under control, no lesions at this time  Hyperglycemia/pre-diabetes : she denies polydipsia , polyuria or polyphagia, last hgbA1C 6.1 % to 6 % .   Mjajor Depression/Anxiety: she has a long history of anxiety and also depression, states that since around July 2018 she felt much worse with no motivation or energy. Changes have been, father died April 30, 2018husband lost his job for 2 months, since Dec brother in law diagnosed with lung cancer and died May 06, 2018. She states she never tried Hydroxyzine. She states since 2019/03/07 she has been feeling better. She wakes up during the night at times and her mind is busy but not very worried when she is awake. Discussed mindfulness meditation.     Vitamin D and B12 deficiency: she is taking supplementation. Unchanged   Morbid obesity: BMI over 35 with co-morbidities such at HTN, dyslipidemia, dysthymia. She is now trying to do intermittent fasting, but she has been changing the times that she fast. Advised to stick with eating only from 2 pm to 7 pm    Dyslipidemia: advised to take crestor daily, she skips doses when she has joint aches, advised to not stop due to intermittent body aches   The 10-year ASCVD risk score (Arnett DK, et al., 03-06-2018) is: 6.7%   Values used to calculate the score:     Age: 61 years     Sex: Female     Is Non-Hispanic African American: No     Diabetic: No     Tobacco smoker: No     Systolic Blood Pressure: 128 mmHg     Is BP  treated: Yes     HDL Cholesterol: 38 mg/dL     Total Cholesterol: 199 mg/dL   Tension headaches: taking baclofen for that and also for body aches, usually tightness and stiff on her nuchal area, no photophobia, phonophobia. Continue prn medication Episodes are a few times a month   Patient Active Problem List   Diagnosis Date Noted   Morbid obesity (HCC) 01/15/2019   Low vitamin B12 level 08/17/2018   Systolic ejection murmur 01/16/2018   Moderate recurrent major depression (HCC) 09/19/2017   Gastro-esophageal reflux disease without esophagitis 09/20/2016   Menopause 09/20/2016   Temporomandibular joint-pain-dysfunction syndrome 09/20/2016   Hyperglycemia 09/20/2016   Vitamin D deficiency 12/22/2015   Hypertension 04/23/2015   Hyperlipemia 04/23/2015    Past Surgical History:  Procedure Laterality Date   ABDOMINAL HYSTERECTOMY  1991   BREAST BIOPSY Left 1997   negative   COLONOSCOPY WITH PROPOFOL N/A 06/10/2015   Procedure: COLONOSCOPY WITH PROPOFOL;  Surgeon: Kieth Brightly, MD;  Location: [facility] ENDOSCOPY;  Service: Endoscopy;  Laterality: N/A;   HEMORRHOID SURGERY  Mar 07, 1987   Dr Lorelee New    Family History  Problem Relation Age of Onset   Diabetes Mother    Alzheimer's disease Mother    Heart disease Mother  CHF   Stroke Mother    Dementia Mother    Stroke Father    Kidney disease Father    Parkinson's disease Father    Hypertension Sister    Diabetes Sister    Diabetes Maternal Grandmother    Breast cancer Paternal Grandfather    Cancer Paternal Grandfather        Breast   Breast cancer Cousin        maternal-twice    Social History   Tobacco Use   Smoking status: Former    Types: Cigarettes    Quit date: 06/26/2014    Years since quitting: 8.3   Smokeless tobacco: Never  Substance Use Topics   Alcohol use: No    Alcohol/week: 0.0 standard drinks of alcohol     Current Outpatient Medications:    amLODipine-valsartan (EXFORGE) 5-160 MG  tablet, TAKE 1 TABLET BY MOUTH EVERY DAY, Disp: 90 tablet, Rfl: 1   atenolol (TENORMIN) 25 MG tablet, TAKE 1/2 TABLET BY MOUTH DAILY, Disp: 45 tablet, Rfl: 1   baclofen 5 MG TABS, Take 5 mg by mouth at bedtime as needed for muscle spasms., Disp: 90 tablet, Rfl: 1   Cholecalciferol (VITAMIN D3) 5000 UNITS CAPS, Take 1 capsule by mouth daily., Disp: , Rfl:    Cyanocobalamin (B-12 SL), Place 1 drop under the tongue daily., Disp: , Rfl:    fluticasone (FLONASE) 50 MCG/ACT nasal spray, SPRAY 2 SPRAYS INTO EACH NOSTRIL EVERY DAY, Disp: 16 mL, Rfl: 5   rosuvastatin (CRESTOR) 5 MG tablet, TAKE 1 TABLET (5 MG TOTAL) BY MOUTH DAILY., Disp: 90 tablet, Rfl: 1   triamcinolone ointment (KENALOG) 0.1 %, APPLY ON AFFECTED AREA TWICE DAILY AS NEEDED., Disp: , Rfl:   Allergies  Allergen Reactions   Hydrochlorothiazide Other (See Comments)   Lisinopril Cough    I personally reviewed active problem list, medication list, allergies, family history, social history, health maintenance with the patient/caregiver today.   ROS  Constitutional: Negative for fever or weight change.  Respiratory: Negative for cough and shortness of breath.   Cardiovascular: Negative for chest pain or palpitations.  Gastrointestinal: Negative for abdominal pain, no bowel changes.  Musculoskeletal: Negative for gait problem or joint swelling.  Skin: Negative for rash.  Neurological: Negative for dizziness or headache.  No other specific complaints in a complete review of systems (except as listed in HPI above).   Objective  Vitals:   10/19/22 1015  BP: 128/74  Pulse: 79  Resp: 16  Temp: 98 F (36.7 C)  TempSrc: Oral  SpO2: 92%  Weight: 187 lb (84.8 kg)  Height: 5\' 1"  (1.549 m)    Body mass index is 35.33 kg/m.  Physical Exam  Constitutional: Patient appears well-developed and well-nourished. Obese  No distress.  HEENT: head atraumatic, normocephalic, pupils equal and reactive to light, neck supple Cardiovascular:  Normal rate, regular rhythm and normal heart sounds.  No murmur heard. No BLE edema. Pulmonary/Chest: Effort normal and breath sounds normal. No respiratory distress. Abdominal: Soft.  There is no tenderness. Psychiatric: Patient has a normal mood and affect. behavior is normal. Judgment and thought content normal.    PHQ2/9:    10/19/2022   10:17 AM 07/19/2022    9:14 AM 03/03/2022    9:04 AM 09/02/2021    9:48 AM 07/15/2021    9:42 AM  Depression screen PHQ 2/9  Decreased Interest 0 0 0 0 0  Down, Depressed, Hopeless 0 0 0 0 0  PHQ - 2 Score  0 0 0 0 0  Altered sleeping 0 2 2 0 0  Tired, decreased energy 0 2 0 0 0  Change in appetite 0 0 0 0 0  Feeling bad or failure about yourself  0 0 0 0 0  Trouble concentrating 0 0 0 0 0  Moving slowly or fidgety/restless 0 0 0 0 0  Suicidal thoughts 0 0 0 0 0  PHQ-9 Score 0 4 2 0 0  Difficult doing work/chores   Somewhat difficult      phq 9 is negative   Fall Risk:    10/19/2022   10:17 AM 07/19/2022    9:14 AM 03/03/2022    9:02 AM 09/02/2021    9:48 AM 07/15/2021    9:42 AM  Fall Risk   Falls in the past year? 0 0 0 0 0  Number falls in past yr:  0   0  Injury with Fall?  0   0  Risk for fall due to : No Fall Risks No Fall Risks No Fall Risks  No Fall Risks  Follow up Falls prevention discussed;Falls evaluation completed;Education provided Falls prevention discussed Falls prevention discussed Falls prevention discussed Falls prevention discussed      Functional Status Survey: Is the patient deaf or have difficulty hearing?: No Does the patient have difficulty seeing, even when wearing glasses/contacts?: No Does the patient have difficulty concentrating, remembering, or making decisions?: No Does the patient have difficulty walking or climbing stairs?: No Does the patient have difficulty dressing or bathing?: No Does the patient have difficulty doing errands alone such as visiting a doctor's office or shopping?:  No    Assessment & Plan  1. Vitamin D deficiency  Continue supplementation   2. Dyslipidemia (high LDL; low HDL)   3. Pre-diabetes  She is trying to cut down on carbohydrates  4. B12 deficiency   5. Morbid obesity (HCC)  She has started intermittent fasting  6. Depression, major, in remission (HCC)  Doing well at this time   7. GAD (generalized anxiety disorder)   8. Essential hypertension  At goal   9. Perennial allergic rhinitis with seasonal variation   10. Tension headache  Stable  11. Lichen sclerosus

## 2022-10-19 ENCOUNTER — Ambulatory Visit: Payer: 59 | Admitting: Family Medicine

## 2022-10-19 ENCOUNTER — Encounter: Payer: Self-pay | Admitting: Family Medicine

## 2022-10-19 VITALS — BP 128/74 | HR 79 | Temp 98.0°F | Resp 16 | Ht 61.0 in | Wt 187.0 lb

## 2022-10-19 DIAGNOSIS — E559 Vitamin D deficiency, unspecified: Secondary | ICD-10-CM

## 2022-10-19 DIAGNOSIS — E538 Deficiency of other specified B group vitamins: Secondary | ICD-10-CM | POA: Diagnosis not present

## 2022-10-19 DIAGNOSIS — I10 Essential (primary) hypertension: Secondary | ICD-10-CM | POA: Diagnosis not present

## 2022-10-19 DIAGNOSIS — J3089 Other allergic rhinitis: Secondary | ICD-10-CM | POA: Diagnosis not present

## 2022-10-19 DIAGNOSIS — R7303 Prediabetes: Secondary | ICD-10-CM

## 2022-10-19 DIAGNOSIS — R69 Illness, unspecified: Secondary | ICD-10-CM | POA: Diagnosis not present

## 2022-10-19 DIAGNOSIS — F325 Major depressive disorder, single episode, in full remission: Secondary | ICD-10-CM

## 2022-10-19 DIAGNOSIS — E785 Hyperlipidemia, unspecified: Secondary | ICD-10-CM | POA: Diagnosis not present

## 2022-10-19 DIAGNOSIS — L900 Lichen sclerosus et atrophicus: Secondary | ICD-10-CM | POA: Diagnosis not present

## 2022-10-19 DIAGNOSIS — J302 Other seasonal allergic rhinitis: Secondary | ICD-10-CM

## 2022-10-19 DIAGNOSIS — F411 Generalized anxiety disorder: Secondary | ICD-10-CM

## 2022-10-19 DIAGNOSIS — G44209 Tension-type headache, unspecified, not intractable: Secondary | ICD-10-CM

## 2022-11-10 NOTE — Progress Notes (Signed)
Acute Office Visit  Subjective:     Patient ID: Christie Mcdonald, female    DOB: 05-23-1961, 62 y.o.   MRN: [id number]  Chief Complaint  Patient presents with   Ear Pain    Right with itching    HPI Patient is in today for ear pain. She was diagnosed with severe otitis externa on the right in July as well as acute sinusitis. Otitis externa required a wick at the time. Today she states that she began to have similar symptoms 2 days ago.   EAR PAIN Duration:  2 days ago Involved ear(s): right Quality:  itchy and full feeling Fever: no Otorrhea: no Upper respiratory infection symptoms: yes, nasal congestion  Pruritus: yes Hearing loss: no Water immersion no Using Q-tips: yes Recurrent otitis media: no Status: fluctuating Treatments attempted: Drops    Review of Systems  Constitutional:  Negative for chills and fever.  HENT:  Positive for congestion and ear pain. Negative for ear discharge, hearing loss, sore throat and tinnitus.   Respiratory:  Negative for cough.   Cardiovascular:  Negative for chest pain.        Objective:    BP 136/76   Pulse 91   Temp 98.4 F (36.9 C)   Resp 16   Ht 5\' 1"  (1.549 m)   Wt 186 lb (84.4 kg)   SpO2 95%   BMI 35.14 kg/m  BP Readings from Last 3 Encounters:  11/11/22 136/76  10/19/22 128/74  07/19/22 132/76   Wt Readings from Last 3 Encounters:  11/11/22 186 lb (84.4 kg)  10/19/22 187 lb (84.8 kg)  07/19/22 185 lb (83.9 kg)      Physical Exam Constitutional:      Appearance: Normal appearance.  HENT:     Head: Normocephalic and atraumatic.     Right Ear: There is impacted cerumen.     Left Ear: Tympanic membrane and ear canal normal.     Ears:     Comments: Atopic dermatitis present on the external portion of both ears, worse on the right. Ear canal slightly swollen on the right with deep wax, possibly cotton fibers deep in the right ear canal, unable to view right TM    Mouth/Throat:     Mouth: Mucous membranes  are moist.     Pharynx: Oropharynx is clear.  Eyes:     Conjunctiva/sclera: Conjunctivae normal.  Cardiovascular:     Rate and Rhythm: Normal rate and regular rhythm.  Pulmonary:     Effort: Pulmonary effort is normal.     Breath sounds: Normal breath sounds.  Skin:    General: Skin is warm and dry.  Neurological:     General: No focal deficit present.     Mental Status: She is alert. Mental status is at baseline.  Psychiatric:        Mood and Affect: Mood normal.        Behavior: Behavior normal.     No results found for any visits on 11/11/22.      Assessment & Plan:   1. Impacted cerumen of right ear/History of otitis externa: Unable to remove deep wax here with ear lavage, referral to ENT placed. Entrance to ear canal slightly swollen but no current otitis externa present.   - Ambulatory referral to ENT - Ear Lavage  2. Atopic dermatitis, unspecified type: Present around the entrance of the ear - has topical sterids to use.   Return if symptoms worsen or fail to improve.  Teodora Medici, DO

## 2022-11-11 ENCOUNTER — Encounter: Payer: Self-pay | Admitting: Internal Medicine

## 2022-11-11 ENCOUNTER — Ambulatory Visit: Payer: 59 | Admitting: Internal Medicine

## 2022-11-11 VITALS — BP 136/76 | HR 91 | Temp 98.4°F | Resp 16 | Ht 61.0 in | Wt 186.0 lb

## 2022-11-11 DIAGNOSIS — L209 Atopic dermatitis, unspecified: Secondary | ICD-10-CM

## 2022-11-11 DIAGNOSIS — Z8669 Personal history of other diseases of the nervous system and sense organs: Secondary | ICD-10-CM | POA: Diagnosis not present

## 2022-11-11 DIAGNOSIS — H6121 Impacted cerumen, right ear: Secondary | ICD-10-CM | POA: Diagnosis not present

## 2022-11-11 NOTE — Patient Instructions (Addendum)
It was great seeing you today!  Plan discussed at today's visit: -Use steroid cream on EXTERNAL ear only for eczema -Recommend finishing Ciprodex drops in the right ear -Referral to ENT placed today  Follow up in: as needed  Take care and let us know if you have any questions or concerns prior to your next visit.  Dr. Rosana Berger

## 2022-12-03 ENCOUNTER — Other Ambulatory Visit: Payer: Self-pay | Admitting: Family Medicine

## 2022-12-03 DIAGNOSIS — J302 Other seasonal allergic rhinitis: Secondary | ICD-10-CM

## 2022-12-06 DIAGNOSIS — H6063 Unspecified chronic otitis externa, bilateral: Secondary | ICD-10-CM | POA: Diagnosis not present

## 2022-12-06 DIAGNOSIS — R682 Dry mouth, unspecified: Secondary | ICD-10-CM | POA: Diagnosis not present

## 2022-12-13 ENCOUNTER — Encounter: Payer: Self-pay | Admitting: Internal Medicine

## 2023-02-20 ENCOUNTER — Ambulatory Visit: Payer: 59 | Admitting: Family Medicine

## 2023-02-20 NOTE — Progress Notes (Unsigned)
Name: Christie Mcdonald   MRN: [id number]    DOB: [birth year]   Date:02/21/2023       Progress Note  Subjective  Chief Complaint  Follow Up  HPI  HTN: she is taking medication as prescribed she is back on Atenolol and Exforge She denies dizziness, chest pain or palpitation. She states last week her bp was high and SBP was 160. She states she ate some salami and was drinking more caffeine / different type of tea. She states she has been eating healthier and bp is back to normal    Lichen Planus: diagnosed by dermatologist 7 years ago, on arms legs and feet, raised dark spots, some flat and some raised, mild pruritis at times, she uses topical steroids prn , symptoms are under control, no lesions at this time  Hyperglycemia/pre-diabetes : she denies polydipsia , polyuria or polyphagia, last hgbA1C was 6% ,continue life style modification .   Mjajor Depression in remission /Anxiety: she has a long history of anxiety and also depression, states that since around July 2018 she felt much worse with no motivation or energy. Changes have been, father died 2018/05/13husband lost his job for 2 months, since Dec brother in law diagnosed with lung cancer and died 05/19/2018. She states she never tried Hydroxyzine. She states since 03-20-2019 she has been feeling better. She retired in September and husband retired recently. No longer feeling overwhelmed     Vitamin D and B12 deficiency: she is taking supplementation. Last levels at goal   Obesity: BMI is now below 35 , no longer morbidly obese, she does have  co-morbidities such at HTN, dyslipidemia, dysthymia. She has stopped doing intermittent fasting, but eating less , usually only eats when she is hungry  Gum irritation: seen by dentist, seen by ENT, referred to Dermatologist - Dr. Odis Luster at [facility] and is now using magic mouthwash prn, still has redness and irritation in different areas. Possibly lichen planus . She suggested biopsy but since [facility] is out of  network she has not been back, advised patient to contact Dr. Andee Poles to see if he can do the biopsy in his office   Dyslipidemia: she is still taking medications - statin  The 10-year ASCVD risk score (Arnett DK, et al., Mar 19, 2018) is: 7.5%   Values used to calculate the score:     Age: 62 years     Sex: Female     Is Non-Hispanic African American: No     Diabetic: No     Tobacco smoker: No     Systolic Blood Pressure: 136 mmHg     Is BP treated: Yes     HDL Cholesterol: 38 mg/dL     Total Cholesterol: 199 mg/dL   Tension headaches: taking baclofen for that and also for body aches, usually tightness and stiff on her nuchal area, no photophobia, phonophobia. Continue prn medication, she does not take it more than 5 per month    Patient Active Problem List   Diagnosis Date Noted   Morbid obesity 01/15/2019   Low vitamin B12 level 08/17/2018   Systolic ejection murmur 01/16/2018   Moderate recurrent major depression 09/19/2017   Gastro-esophageal reflux disease without esophagitis 09/20/2016   Menopause 09/20/2016   Temporomandibular joint-pain-dysfunction syndrome 09/20/2016   Hyperglycemia 09/20/2016   Vitamin D deficiency 12/22/2015   Hypertension 04/23/2015   Hyperlipemia 04/23/2015    Past Surgical History:  Procedure Laterality Date   ABDOMINAL HYSTERECTOMY  1991   BREAST BIOPSY  Left 1997   negative   COLONOSCOPY WITH PROPOFOL N/A 06/10/2015   Procedure: COLONOSCOPY WITH PROPOFOL;  Surgeon: Kieth Brightly, MD;  Location: [facility] ENDOSCOPY;  Service: Endoscopy;  Laterality: N/A;   HEMORRHOID SURGERY  1988   Dr Lorelee New    Family History  Problem Relation Age of Onset   Diabetes Mother    Alzheimer's disease Mother    Heart disease Mother        CHF   Stroke Mother    Dementia Mother    Stroke Father    Kidney disease Father    Parkinson's disease Father    Hypertension Sister    Diabetes Sister    Diabetes Maternal Grandmother    Breast cancer Paternal  Grandfather    Cancer Paternal Grandfather        Breast   Breast cancer Cousin        maternal-twice    Social History   Tobacco Use   Smoking status: Former    Types: Cigarettes    Quit date: 06/26/2014    Years since quitting: 8.6   Smokeless tobacco: Never  Substance Use Topics   Alcohol use: No    Alcohol/week: 0.0 standard drinks of alcohol     Current Outpatient Medications:    amLODipine-valsartan (EXFORGE) 5-160 MG tablet, TAKE 1 TABLET BY MOUTH EVERY DAY, Disp: 90 tablet, Rfl: 1   atenolol (TENORMIN) 25 MG tablet, TAKE 1/2 TABLET BY MOUTH DAILY, Disp: 45 tablet, Rfl: 1   baclofen 5 MG TABS, Take 5 mg by mouth at bedtime as needed for muscle spasms., Disp: 90 tablet, Rfl: 1   Cholecalciferol (VITAMIN D3) 5000 UNITS CAPS, Take 1 capsule by mouth daily., Disp: , Rfl:    Cyanocobalamin (B-12 SL), Place 1 drop under the tongue daily., Disp: , Rfl:    fluticasone (FLONASE) 50 MCG/ACT nasal spray, SPRAY 2 SPRAYS INTO EACH NOSTRIL EVERY DAY, Disp: 48 mL, Rfl: 2   rosuvastatin (CRESTOR) 5 MG tablet, TAKE 1 TABLET (5 MG TOTAL) BY MOUTH DAILY., Disp: 90 tablet, Rfl: 1   triamcinolone ointment (KENALOG) 0.1 %, APPLY ON AFFECTED AREA TWICE DAILY AS NEEDED., Disp: , Rfl:   Allergies  Allergen Reactions   Hydrochlorothiazide Other (See Comments)   Lisinopril Cough    I personally reviewed active problem list, medication list, allergies, family history, social history, health maintenance with the patient/caregiver today.   ROS  Ten systems reviewed and is negative except as mentioned in HPI   Objective  Vitals:   02/21/23 1510  BP: 136/80  Pulse: 95  Resp: 16  SpO2: 96%  Weight: 182 lb (82.6 kg)  Height:  (1.549 m)    Body mass index is 34.39 kg/m.  Physical Exam  Constitutional: Patient appears well-developed and well-nourished. Obese  No distress.  HEENT: head atraumatic, normocephalic, pupils equal and reactive to light,, neck supple, oral mucosa has  erythema and some swelling of gum around lower front teeth  Cardiovascular: Normal rate, regular rhythm and normal heart sounds.  No murmur heard. No BLE edema. Pulmonary/Chest: Effort normal and breath sounds normal. No respiratory distress. Abdominal: Soft.  There is no tenderness. Psychiatric: Patient has a normal mood and affect. behavior is normal. Judgment and thought content normal.    PHQ2/9:    02/21/2023    3:09 PM 11/11/2022   10:56 AM 11/11/2022   10:54 AM 10/19/2022   10:17 AM 07/19/2022    9:14 AM  Depression screen PHQ 2/9  Decreased Interest 0 0 0 0 0  Down, Depressed, Hopeless 0 0 0 0 0  PHQ - 2 Score 0 0 0 0 0  Altered sleeping 0 0 0 0 2  Tired, decreased energy 0 0 0 0 2  Change in appetite 0 0 0 0 0  Feeling bad or failure about yourself  0 0 0 0 0  Trouble concentrating 0 0 0 0 0  Moving slowly or fidgety/restless 0 0 0 0 0  Suicidal thoughts 0 0 0 0 0  PHQ-9 Score 0 0 0 0 4  Difficult doing work/chores  Not difficult at all Not difficult at all      phq 9 is negative   Fall Risk:    02/21/2023    3:09 PM 11/11/2022   10:56 AM 11/11/2022   10:54 AM 10/19/2022   10:17 AM 07/19/2022    9:14 AM  Fall Risk   Falls in the past year? 0 0 0 0 0  Number falls in past yr: 0 0 0  0  Injury with Fall? 0 0 0  0  Risk for fall due to : No Fall Risks   No Fall Risks No Fall Risks  Follow up Falls prevention discussed   Falls prevention discussed;Falls evaluation completed;Education provided Falls prevention discussed      Functional Status Survey: Is the patient deaf or have difficulty hearing?: No Does the patient have difficulty seeing, even when wearing glasses/contacts?: No Does the patient have difficulty concentrating, remembering, or making decisions?: No Does the patient have difficulty walking or climbing stairs?: No Does the patient have difficulty dressing or bathing?: No Does the patient have difficulty doing errands alone such as visiting a doctor's  office or shopping?: No    Assessment & Plan  1. Depression, major, in remission  Doing well   2. Vitamin D deficiency  Continue supplementation  3. Essential hypertension  - amLODipine-valsartan (EXFORGE) 5-160 MG tablet; Take 1 tablet by mouth daily.  Dispense: 90 tablet; Refill: 1 - atenolol (TENORMIN) 25 MG tablet; Take 0.5 tablets (12.5 mg total) by mouth daily.  Dispense: 45 tablet; Refill: 1  4. Dyslipidemia (high LDL; low HDL)  - rosuvastatin (CRESTOR) 5 MG tablet; Take 1 tablet (5 mg total) by mouth daily.  Dispense: 90 tablet; Refill: 1  5. Lichen sclerosus  Keep follow up with dermatologist   6. B12 deficiency  Continue supplementation  7. Tension headache   Taking baclofen prn

## 2023-02-21 ENCOUNTER — Encounter: Payer: Self-pay | Admitting: Family Medicine

## 2023-02-21 ENCOUNTER — Ambulatory Visit: Payer: 59 | Admitting: Family Medicine

## 2023-02-21 VITALS — BP 136/80 | HR 95 | Resp 16 | Ht 61.0 in | Wt 182.0 lb

## 2023-02-21 DIAGNOSIS — E538 Deficiency of other specified B group vitamins: Secondary | ICD-10-CM | POA: Diagnosis not present

## 2023-02-21 DIAGNOSIS — G44209 Tension-type headache, unspecified, not intractable: Secondary | ICD-10-CM | POA: Diagnosis not present

## 2023-02-21 DIAGNOSIS — I10 Essential (primary) hypertension: Secondary | ICD-10-CM

## 2023-02-21 DIAGNOSIS — E785 Hyperlipidemia, unspecified: Secondary | ICD-10-CM | POA: Diagnosis not present

## 2023-02-21 DIAGNOSIS — F325 Major depressive disorder, single episode, in full remission: Secondary | ICD-10-CM | POA: Diagnosis not present

## 2023-02-21 DIAGNOSIS — L900 Lichen sclerosus et atrophicus: Secondary | ICD-10-CM | POA: Diagnosis not present

## 2023-02-21 DIAGNOSIS — E559 Vitamin D deficiency, unspecified: Secondary | ICD-10-CM

## 2023-02-21 MED ORDER — ROSUVASTATIN CALCIUM 5 MG PO TABS: 5.0000 mg | ORAL_TABLET | Freq: Every day | ORAL | 1 refills | Status: DC

## 2023-02-21 MED ORDER — ATENOLOL 25 MG PO TABS: 12.5000 mg | ORAL_TABLET | Freq: Every day | ORAL | 1 refills | Status: DC

## 2023-02-21 MED ORDER — AMLODIPINE BESYLATE-VALSARTAN 5-160 MG PO TABS: 1.0000 | ORAL_TABLET | Freq: Every day | ORAL | 1 refills | Status: DC

## 2023-08-22 NOTE — Progress Notes (Unsigned)
Name: Christie Mcdonald   MRN: [id number]    DOB: 01/26/1961   Date:08/23/2023       Progress Note  Subjective  Chief Complaint  Follow up  HPI  HTN: she is taking medication as prescribed she is back on Atenolol and Exforge She denies dizziness, chest pain or palpitation. BP is good today.    Lichen Planus: diagnosed by dermatologist 7 years ago, on arms legs and feet, raised dark spots, some flat and some raised, mild pruritis at times, she uses topical steroids prn , symptoms are under control, no lesions at this time  Hyperglycemia/pre-diabetes : she denies polydipsia , polyuria or polyphagia, last hgbA1C was 6% ,continue life style modification , we will recheck labs .   Mjajor Depression in remission /Anxiety: she has a long history of anxiety and also depression, states that since around July 2018 she felt much worse with no motivation or energy. Changes have been, father died May 06, 2018husband lost his job for 2 months, since Dec brother in law diagnosed with lung cancer and died 05-12-2018. She states she never tried Hydroxyzine. She states since 2020 she has been feeling better. She retired in September 2023 and husband retired rin 2024  No longer feeling overwhelmed . She is happy her granddaughter and great granddaughter moved in with them, taking the great granddaughter on walks     Vitamin D and B12 deficiency: she is taking supplementation. Last levels at goal   Obesity: BMI is now below 35 , no longer morbidly obese, she does have  co-morbidities such at HTN, dyslipidemia, dysthymia. She is still following intermittent fasting, smaller portions and cutting down on carbs.   Lichen  seen by dentist, seen by ENT, referred to Dermatologist - Dr. Odis Luster at [facility] and is now using magic mouthwash prn, still has redness and irritation in different areas. . She has a oral biopsy that was inconclusive but is using medication prn for lichen, now having spots on her arm and back and needs to  go back to dermatologist however [facility] is no longer in network, we will send her to [facility]   Dyslipidemia: she is still taking medications - statin - but skipped medication this past week   The 10-year ASCVD risk score (Arnett DK, et al., 2019) is: 7.7%   Values used to calculate the score:     Age: 62 years     Sex: Female     Is Non-Hispanic African American: No     Diabetic: No     Tobacco smoker: No     Systolic Blood Pressure: 132 mmHg     Is BP treated: Yes     HDL Cholesterol: 38 mg/dL     Total Cholesterol: 199 mg/dL   Tension headaches: taking baclofen for that and also for body aches, usually tightness and stiff on her nuchal area, no photophobia, phonophobia. Continue prn medication,. She needs a refill today  Muscle spasms: she has intermittent muscle spasms  and takes baclofen prn   Patient Active Problem List   Diagnosis Date Noted   Lichen sclerosus 02/21/2023   Depression, major, in remission (HCC) 02/21/2023   B12 deficiency 02/21/2023   Tension headache 02/21/2023   Systolic ejection murmur 01/16/2018   Gastro-esophageal reflux disease without esophagitis 09/20/2016   Menopause 09/20/2016   Temporomandibular joint-pain-dysfunction syndrome 09/20/2016   Hyperglycemia 09/20/2016   Vitamin D deficiency 12/22/2015   Essential hypertension 04/23/2015   Dyslipidemia (high LDL; low HDL) 04/23/2015  Past Surgical History:  Procedure Laterality Date   ABDOMINAL HYSTERECTOMY  1991   BREAST BIOPSY Left 1997   negative   COLONOSCOPY WITH PROPOFOL N/A 06/10/2015   Procedure: COLONOSCOPY WITH PROPOFOL;  Surgeon: Kieth Brightly, MD;  Location: [facility] ENDOSCOPY;  Service: Endoscopy;  Laterality: N/A;   HEMORRHOID SURGERY  1988   Dr Lorelee New    Family History  Problem Relation Age of Onset   Diabetes Mother    Alzheimer's disease Mother    Heart disease Mother        CHF   Stroke Mother    Dementia Mother    Stroke Father    Kidney disease  Father    Parkinson's disease Father    Hypertension Sister    Diabetes Sister    Diabetes Maternal Grandmother    Breast cancer Paternal Grandfather    Cancer Paternal Grandfather        Breast   Breast cancer Cousin        maternal-twice    Social History   Tobacco Use   Smoking status: Former    Current packs/day: 0.00    Types: Cigarettes    Quit date: 06/26/2014    Years since quitting: 9.1   Smokeless tobacco: Never  Substance Use Topics   Alcohol use: No    Alcohol/week: 0.0 standard drinks of alcohol     Current Outpatient Medications:    chlorhexidine (PERIDEX) 0.12 % solution, 2 (two) times daily., Disp: , Rfl:    Cholecalciferol (VITAMIN D3) 50 MCG (2000 UT) capsule, Take 1 capsule (2,000 Units total) by mouth daily., Disp: 100 capsule, Rfl: 0   Cyanocobalamin (B-12 SL), Place 1 drop under the tongue daily., Disp: , Rfl:    fluocinonide gel (LIDEX) 0.05 %, SMARTSIG:Sparingly Topical 2-3 Times Daily, Disp: , Rfl:    fluticasone (FLONASE) 50 MCG/ACT nasal spray, SPRAY 2 SPRAYS INTO EACH NOSTRIL EVERY DAY, Disp: 48 mL, Rfl: 2   triamcinolone ointment (KENALOG) 0.1 %, APPLY ON AFFECTED AREA TWICE DAILY AS NEEDED., Disp: , Rfl:    amLODipine-valsartan (EXFORGE) 5-160 MG tablet, Take 1 tablet by mouth daily., Disp: 90 tablet, Rfl: 1   atenolol (TENORMIN) 25 MG tablet, Take 0.5 tablets (12.5 mg total) by mouth daily., Disp: 45 tablet, Rfl: 1   Baclofen 5 MG TABS, Take 1 tablet (5 mg total) by mouth at bedtime as needed., Disp: 90 tablet, Rfl: 1   rosuvastatin (CRESTOR) 5 MG tablet, Take 1 tablet (5 mg total) by mouth daily., Disp: 90 tablet, Rfl: 1  Allergies  Allergen Reactions   Hydrochlorothiazide Other (See Comments)   Lisinopril Cough    I personally reviewed active problem list, medication list, allergies, family history with the patient/caregiver today.   ROS  Ten systems reviewed and is negative except as mentioned in HPI    Objective  Vitals:    08/23/23 1025  BP: 132/78  Pulse: 76  Resp: 14  Temp: 97.9 F (36.6 C)  TempSrc: Oral  SpO2: 98%  Weight: 174 lb 14.4 oz (79.3 kg)  Height: 5\' 1"  (1.549 m)    Body mass index is 33.05 kg/m.  Physical Exam  Constitutional: Patient appears well-developed and well-nourished. Obese  No distress.  HEENT: head atraumatic, normocephalic, pupils equal and reactive to light, neck supple Cardiovascular: Normal rate, regular rhythm and normal heart sounds.  No murmur heard. No BLE edema. Pulmonary/Chest: Effort normal and breath sounds normal. No respiratory distress. Abdominal: Soft.  There is no tenderness.  Psychiatric: Patient has a normal mood and affect. behavior is normal. Judgment and thought content normal.   PHQ2/9:    08/23/2023   10:27 AM 02/21/2023    3:09 PM 11/11/2022   10:56 AM 11/11/2022   10:54 AM 10/19/2022   10:17 AM  Depression screen PHQ 2/9  Decreased Interest 0 0 0 0 0  Down, Depressed, Hopeless 0 0 0 0 0  PHQ - 2 Score 0 0 0 0 0  Altered sleeping 0 0 0 0 0  Tired, decreased energy 0 0 0 0 0  Change in appetite 0 0 0 0 0  Feeling bad or failure about yourself  0 0 0 0 0  Trouble concentrating 0 0 0 0 0  Moving slowly or fidgety/restless 0 0 0 0 0  Suicidal thoughts 0 0 0 0 0  PHQ-9 Score 0 0 0 0 0  Difficult doing work/chores   Not difficult at all Not difficult at all     phq 9 is negative   Fall Risk:    08/23/2023   10:27 AM 02/21/2023    3:09 PM 11/11/2022   10:56 AM 11/11/2022   10:54 AM 10/19/2022   10:17 AM  Fall Risk   Falls in the past year? 0 0 0 0 0  Number falls in past yr:  0 0 0   Injury with Fall?  0 0 0   Risk for fall due to : No Fall Risks No Fall Risks   No Fall Risks  Follow up Falls prevention discussed Falls prevention discussed   Falls prevention discussed;Falls evaluation completed;Education provided    Assessment & Plan  1. Depression, major, in remission [facility])  Doing well since not working   2. Need for immunization  against influenza  refused  3. Obesity   She has changed her diet, doing intermittent fasting and has lost 7 lbs since last visit   4. Dyslipidemia (high LDL; low HDL)  - rosuvastatin (CRESTOR) 5 MG tablet; Take 1 tablet (5 mg total) by mouth daily.  Dispense: 90 tablet; Refill: 1 - Lipid panel  5. Lichen sclerosus  - Ambulatory referral to Dermatology  6. Essential hypertension  - amLODipine-valsartan (EXFORGE) 5-160 MG tablet; Take 1 tablet by mouth daily.  Dispense: 90 tablet; Refill: 1 - atenolol (TENORMIN) 25 MG tablet; Take 0.5 tablets (12.5 mg total) by mouth daily.  Dispense: 45 tablet; Refill: 1 - CBC with Differential/Platelet - COMPLETE METABOLIC PANEL WITH GFR  7. B12 deficiency  - B12 and Folate Panel  8. Vitamin D deficiency  - VITAMIN D 25 Hydroxy (Vit-D Deficiency, Fractures) - Cholecalciferol (VITAMIN D3) 50 MCG (2000 UT) capsule; Take 1 capsule (2,000 Units total) by mouth daily.  Dispense: 100 capsule; Refill: 0  9. Hyperglycemia  - Hemoglobin A1c  10. Muscle spasm  - Baclofen 5 MG TABS; Take 1 tablet (5 mg total) by mouth at bedtime as needed.  Dispense: 90 tablet; Refill: 1

## 2023-08-23 ENCOUNTER — Ambulatory Visit: Payer: 59 | Admitting: Family Medicine

## 2023-08-23 VITALS — BP 132/78 | HR 76 | Temp 97.9°F | Resp 14 | Ht 61.0 in | Wt 174.9 lb

## 2023-08-23 DIAGNOSIS — R739 Hyperglycemia, unspecified: Secondary | ICD-10-CM | POA: Diagnosis not present

## 2023-08-23 DIAGNOSIS — E559 Vitamin D deficiency, unspecified: Secondary | ICD-10-CM | POA: Diagnosis not present

## 2023-08-23 DIAGNOSIS — Z1231 Encounter for screening mammogram for malignant neoplasm of breast: Secondary | ICD-10-CM | POA: Diagnosis not present

## 2023-08-23 DIAGNOSIS — M62838 Other muscle spasm: Secondary | ICD-10-CM | POA: Diagnosis not present

## 2023-08-23 DIAGNOSIS — E66811 Obesity, class 1: Secondary | ICD-10-CM

## 2023-08-23 DIAGNOSIS — Z23 Encounter for immunization: Secondary | ICD-10-CM

## 2023-08-23 DIAGNOSIS — L900 Lichen sclerosus et atrophicus: Secondary | ICD-10-CM | POA: Diagnosis not present

## 2023-08-23 DIAGNOSIS — E785 Hyperlipidemia, unspecified: Secondary | ICD-10-CM

## 2023-08-23 DIAGNOSIS — F325 Major depressive disorder, single episode, in full remission: Secondary | ICD-10-CM | POA: Diagnosis not present

## 2023-08-23 DIAGNOSIS — E538 Deficiency of other specified B group vitamins: Secondary | ICD-10-CM | POA: Diagnosis not present

## 2023-08-23 DIAGNOSIS — I10 Essential (primary) hypertension: Secondary | ICD-10-CM | POA: Diagnosis not present

## 2023-08-23 MED ORDER — ATENOLOL 25 MG PO TABS: 12.5000 mg | ORAL_TABLET | Freq: Every day | ORAL | 1 refills | Status: DC

## 2023-08-23 MED ORDER — VITAMIN D3 50 MCG (2000 UT) PO CAPS
2000.0000 [IU] | ORAL_CAPSULE | Freq: Every day | ORAL | 0 refills | Status: AC
Start: 2023-08-23 — End: ?

## 2023-08-23 MED ORDER — BACLOFEN 5 MG PO TABS
5.0000 mg | ORAL_TABLET | Freq: Every evening | ORAL | 1 refills | Status: DC | PRN
Start: 2023-08-23 — End: 2024-02-14

## 2023-08-23 MED ORDER — AMLODIPINE BESYLATE-VALSARTAN 5-160 MG PO TABS: 1.0000 | ORAL_TABLET | Freq: Every day | ORAL | 1 refills | Status: DC

## 2023-08-23 MED ORDER — ROSUVASTATIN CALCIUM 5 MG PO TABS: 5.0000 mg | ORAL_TABLET | Freq: Every day | ORAL | 1 refills | Status: DC

## 2023-08-24 LAB — LIPID PANEL
Cholesterol: 204 mg/dL — ABNORMAL HIGH (ref <200)
HDL: 41 mg/dL — ABNORMAL LOW (ref >=50)
LDL Cholesterol (Calc): 127 mg/dL — ABNORMAL HIGH
Non-HDL Cholesterol (Calc): 163 mg/dL — ABNORMAL HIGH (ref <130)
Total CHOL/HDL Ratio: 5 (calc) — ABNORMAL HIGH (ref <5.0)
Triglycerides: 216 mg/dL — ABNORMAL HIGH (ref <150)

## 2023-08-24 LAB — VITAMIN D 25 HYDROXY (VIT D DEFICIENCY, FRACTURES): Vit D, 25-Hydroxy: 79 ng/mL (ref 30–100)

## 2023-08-24 LAB — CBC WITH DIFFERENTIAL/PLATELET
Absolute Lymphocytes: 1261 {cells}/uL (ref 850–3900)
Absolute Monocytes: 422 {cells}/uL (ref 200–950)
Basophils Absolute: 73 {cells}/uL (ref 0–200)
Basophils Relative: 1.1 %
Eosinophils Absolute: 218 {cells}/uL (ref 15–500)
Eosinophils Relative: 3.3 %
HCT: 41.6 % (ref 35.0–45.0)
Hemoglobin: 13.4 g/dL (ref 11.7–15.5)
MCH: 29.6 pg (ref 27.0–33.0)
MCHC: 32.2 g/dL (ref 32.0–36.0)
MCV: 91.8 fL (ref 80.0–100.0)
MPV: 10.7 fL (ref 7.5–12.5)
Monocytes Relative: 6.4 %
Neutro Abs: 4627 {cells}/uL (ref 1500–7800)
Neutrophils Relative %: 70.1 %
Platelets: 373 10*3/uL (ref 140–400)
RBC: 4.53 10*6/uL (ref 3.80–5.10)
RDW: 13.1 % (ref 11.0–15.0)
Total Lymphocyte: 19.1 %
WBC: 6.6 10*3/uL (ref 3.8–10.8)

## 2023-08-24 LAB — COMPLETE METABOLIC PANEL WITH GFR
AG Ratio: 1.5 (calc) (ref 1.0–2.5)
ALT: 13 U/L (ref 6–29)
AST: 16 U/L (ref 10–35)
Albumin: 4.6 g/dL (ref 3.6–5.1)
Alkaline phosphatase (APISO): 94 U/L (ref 37–153)
BUN: 12 mg/dL (ref 7–25)
CO2: 29 mmol/L (ref 20–32)
Calcium: 9.2 mg/dL (ref 8.6–10.4)
Chloride: 98 mmol/L (ref 98–110)
Creat: 0.58 mg/dL (ref 0.50–1.05)
Globulin: 3.1 g/dL (ref 1.9–3.7)
Glucose, Bld: 100 mg/dL — ABNORMAL HIGH (ref 65–99)
Potassium: 4.4 mmol/L (ref 3.5–5.3)
Sodium: 137 mmol/L (ref 135–146)
Total Bilirubin: 0.4 mg/dL (ref 0.2–1.2)
Total Protein: 7.7 g/dL (ref 6.1–8.1)
eGFR: 102 mL/min/{1.73_m2} (ref >=60)

## 2023-08-24 LAB — HEMOGLOBIN A1C
Hgb A1c MFr Bld: 6.5 %{Hb} — ABNORMAL HIGH (ref <5.7)
Mean Plasma Glucose: 140 mg/dL
eAG (mmol/L): 7.7 mmol/L

## 2023-08-24 LAB — B12 AND FOLATE PANEL
Folate: 14.8 ng/mL
Vitamin B-12: 617 pg/mL (ref 200–1100)

## 2023-10-12 ENCOUNTER — Ambulatory Visit
Admission: RE | Admit: 2023-10-12 | Discharge: 2023-10-12 | Disposition: A | Discharge status: Home / Self Care | Payer: 59 | Source: Ambulatory Visit | Attending: Family Medicine | Admitting: Family Medicine

## 2023-10-12 DIAGNOSIS — Z1231 Encounter for screening mammogram for malignant neoplasm of breast: Secondary | ICD-10-CM | POA: Diagnosis not present

## 2023-11-27 ENCOUNTER — Ambulatory Visit: Payer: 59 | Admitting: Dermatology

## 2023-11-27 ENCOUNTER — Encounter: Payer: Self-pay | Admitting: Dermatology

## 2023-11-27 DIAGNOSIS — Z7189 Other specified counseling: Secondary | ICD-10-CM | POA: Diagnosis not present

## 2023-11-27 DIAGNOSIS — L439 Lichen planus, unspecified: Secondary | ICD-10-CM

## 2023-11-27 DIAGNOSIS — K0510 Chronic gingivitis, plaque induced: Secondary | ICD-10-CM | POA: Diagnosis not present

## 2023-11-27 NOTE — Progress Notes (Signed)
New Patient Visit   Subjective  Christie Mcdonald is a 63 y.o. female who presents for the following: lichen planus at arms, legs and feet, diagnosed 10 years ago by dermatologist. Patient uses [facility] 0.1% cr prn.  Patient started having issues with her mouth and went to see Dr. Whitney Post at [facility] in February 2024 and was given an oral mouthwash to use for possible lichen planus in mouth. It has not helped. Patient had biopsy done last May by oral surgeon and results were inconclusive only showing inflammation. He prescribed fluocinonide but patient has not used it for a while and she does not think it helps much. She has been following with the dentist every 3 months for cleanings. No lesions or symptoms in genitals. Skin lesions start as red pimples and progress to brown spots. They come and go.  Three skin biopsies showing lichen planus performed by Dr Orson Aloe  08/20/19 [facility] Dermatology Dr Linton Rump Authorizing Provider:  Abbie Sons, MD       Collected:           08/20/2019 1126             Ordering Location:     [facility] [place] [facility] Received:            08/20/2019 1732             Pathologist:           Abbie Sons, MD                                                       Specimen:    Skin, Left inner thigh, punch                                                           Diagnosis   Left inner thigh, punch -Chronic spongiotic dermatitis with superimposed well-developed lichen simplex chronicus.  See comment.   Likely scalp psoriasis: -Start clobetasoL (TEMOVATE) 0.05 % external solution; Apply topically to scalp twice daily as needed. Dispense: 50 mL; Refill: 4 -Discussed medication risk, benefits, appropriate use.    12/20/22 [facility] Dermatology Dr Odis Luster Inflammation of lower gingiva - Patient has a history of cutaneous lichen planus that has flared off and on for years. No history of mucosal lichen planus. Etiology of inflamed lower gingiva is not completely clear today  but I think current presentation is not yet adequate to diagnose oral lichen planus. Will treat with topical steroids for now and continue to monitor  - Discussed treatment options including trialing steroid ointments vs medicated mouthwash vs taking a biopsy for diagnostic purposes. - Jointly elected to trial with magic mouthwash and triamcinolone ointment. If not improved, will consider biopsy in future. - Start diphenhydrAMINE 12.5 mg/5 mL Liqd 271.25 mg, hydrocortisone 2.5 MG/ML Susp 60 mg, lidocaine 2% viscous 2 % Soln 99.6 mL, nystatin 100,000 unit/mL Susp 3,000,000 Units; 5 mL by Mouth route every six (6) hours as needed. Swish, gargle, spit as needed. May be swallowed if esophageal involvement. - Start triamcinolone (KENALOG) 0.1 % ointment; Apply topically two (2) times a day. With a q  tip to gums until clear. Restart as needed.   03/2023 oral surgeon biopsy Collected:           03/15/2023 1431                                    MD/DMD                                                                     Ordering Location:     [facility]  Received:            03/22/2023 1431                                    Maxillofacial Pathology                                                     Pathologist:           Lindley Magnus, DMD                                                     Specimen:    Mandible, Unspecified                                                                   Diagnosis   Ulcerative mucositis (See comment)   16109, [id number], [id number] K12.30  Electronically signed by Lindley Magnus, DMD on 04/04/2023 at 1515 EDT  Diagnosis Comment   Findings are nonspecific.  Additional surgical sampling with tissue submission for direct immunofluorescence may yield a more specific diagnosis.  Please contact the lab at [phone] should you desire [name] solution for such a submission.  In the absence of epithelium, the interface area between the epithelium and  connective tissue cannot be evaluated.    - patient reports oral surgeon thought lesions were lichen planus and a DIF was not needed.  Patient advises she does have some active areas today at inframammary, right arm and at lower and upper gums.   The following portions of the chart were reviewed this encounter and updated as appropriate: medications, allergies, medical history  Review of Systems:  No other skin or systemic complaints except as noted in HPI or Assessment and Plan.  Objective  Well appearing patient in no apparent distress; mood and affect are within normal limits.    A focused examination was performed of the following areas: Abdomen, mouth, arms  Relevant exam findings are noted in the Assessment and Plan.  oral mucosa Bright red eroded patches on maxillary  and mandibular facial attached gingiva  Hyperpigmented violaceous macule on right AC fossa. Hyperpigmented macules on inframammary skin   Assessment & Plan     CHRONIC DESQUAMATIVE GINGIVITIS oral mucosa Clinical diagnosis: erosive gingivitis. 03/22/23 oral biopsy confirms clinical diagnosis but is nonspecific. Ddx includes lichen planus (most likely given multiple past skin biopsies with lichen planus) vs mucous membrane pemphigoid   08/23/23 CBC diff and CMP were normal  Sending message to Dr[facility] Inis Sizer at [facility] since she specializes in these conditions  Discussed that DIF has value in differentiating OLP from MMP. Patient prefers to treat symptoms rather than do further diagnostic testing. Discussed oral methotrexate, dapsone.  Pending labs, start methotrexate 10 mg once weekly + folic acid 1 mg on other days vs Dapsone vs hydroxychloroquine.  Potential side effects of methotrexate include immunosuppression, mouth sores, GI upset including nausea, vomiting, and diarrhea, liver inflammation, liver fibrosis (more commonly seen with long term use), bone marrow suppression (decrease in white and red blood  cell counts), drug interactions (do not take with sulfa-based antibiotics), and rare pulmonary fibrosis.  It is designated pregnancy category X- do not take if pregnant or trying to get pregnant.  Avoid drinking alcohol while taking methotrexate.  Due to potential side effects, long term medication management with regular office visits and periodic blood testing is necessary while taking methotrexate.  Dapsone side effects, neuropathy, cytopenia, methemoglobinemia  Avoid SLS in toothpaste   Related Procedures Hepatitis B surface antigen Hepatitis B surface antibody,qualitative Hepatitis C antibody HIV Antibody (routine testing w rflx) Hepatitis B core antibody, total QuantiFERON-TB Gold Plus Glucose 6 phosphate dehydrogenase COUNSELING AND COORDINATION OF CARE   LICHEN PLANUS      Return for prn pending lab results .  Anise Salvo, RMA, am acting as scribe for Elie Goody, MD .   Documentation: I have reviewed the above documentation for accuracy and completeness, and I agree with the above.  Elie Goody, MD

## 2023-11-27 NOTE — Patient Instructions (Signed)
Erosive gingivitis (inflammation that damages the top layer of the gums) Can be caused by lichen planus or mucous membrane pemphigoid  Potential side effects of methotrexate include immunosuppression, mouth sores, GI upset including nausea, vomiting, and diarrhea, liver inflammation, liver fibrosis (more commonly seen with long term use), bone marrow suppression (decrease in white and red blood cell counts), drug interactions (do not take with sulfa-based antibiotics), and rare pulmonary fibrosis.  It is designated pregnancy category X- do not take if pregnant or trying to get pregnant.  Avoid drinking alcohol while taking methotrexate.  Due to potential side effects, long term medication management with regular office visits and periodic blood testing is necessary while taking methotrexate.  Due to recent changes in healthcare laws, you may see results of your pathology and/or laboratory studies on MyChart before the doctors have had a chance to review them. We understand that in some cases there may be results that are confusing or concerning to you. Please understand that not all results are received at the same time and often the doctors may need to interpret multiple results in order to provide you with the best plan of care or course of treatment. Therefore, we ask that you please give [place] 2 business days to thoroughly review all your results before contacting the office for clarification. Should we see a critical lab result, you will be contacted sooner.   If You Need Anything After Your Visit  If you have any questions or concerns for your doctor, please call our main line at [phone] and press option 4 to reach your doctor's medical assistant. If no one answers, please leave a voicemail as directed and we will return your call as soon as possible. Messages left after 4 pm will be answered the following business day.   You may also send [place] a message via MyChart. We typically respond to MyChart  messages within 1-2 business days.  For prescription refills, please ask your pharmacy to contact our office. Our fax number is [phone].  If you have an urgent issue when the clinic is closed that cannot wait until the next business day, you can page your doctor at the number below.    Please note that while we do our best to be available for urgent issues outside of office hours, we are not available 24/7.   If you have an urgent issue and are unable to reach [place], you may choose to seek medical care at your doctor's office, retail clinic, urgent care center, or emergency room.  If you have a medical emergency, please immediately call 911 or go to the emergency department.  Pager Numbers  - Dr. Gwen Pounds: [phone]  - Dr. Roseanne Reno: [phone]  - Dr. Katrinka Blazing: [phone]   In the event of inclement weather, please call our main line at [phone] for an update on the status of any delays or closures.  Dermatology Medication Tips: Please keep the boxes that topical medications come in in order to help keep track of the instructions about where and how to use these. Pharmacies typically print the medication instructions only on the boxes and not directly on the medication tubes.   If your medication is too expensive, please contact our office at [phone] option 4 or send [place] a message through MyChart.   We are unable to tell what your co-pay for medications will be in advance as this is different depending on your insurance coverage. However, we may be able to find a substitute medication at  lower cost or fill out paperwork to get insurance to cover a needed medication.   If a prior authorization is required to get your medication covered by your insurance company, please allow [place] 1-2 business days to complete this process.  Drug prices often vary depending on where the prescription is filled and some pharmacies may offer cheaper prices.  The website www.goodrx.com contains  coupons for medications through different pharmacies. The prices here do not account for what the cost may be with help from insurance (it may be cheaper with your insurance), but the website can give you the price if you did not use any insurance.  - You can print the associated coupon and take it with your prescription to the pharmacy.  - You may also stop by our office during regular business hours and pick up a GoodRx coupon card.  - If you need your prescription sent electronically to a different pharmacy, notify our office through [facility] or by phone at [phone] option 4.     Si Usted Necesita Algo Despus de Su Visita  Tambin puede enviarnos un mensaje a travs de Clinical cytogeneticist. Por lo general respondemos a los mensajes de MyChart en el transcurso de 1 a 2 das hbiles.  Para renovar recetas, por favor pida a su farmacia que se ponga en contacto con nuestra oficina. Annie Sable de fax es [place] [phone].  Si tiene un asunto urgente cuando la clnica est cerrada y que no puede esperar hasta el siguiente da hbil, puede llamar/localizar a su doctor(a) al nmero que aparece a continuacin.   Por favor, tenga en cuenta que aunque hacemos todo lo posible para estar disponibles para asuntos urgentes fuera del horario de [place], no estamos disponibles las 24 horas del da, los 7 [address]  [address] de la [place].   Si tiene un problema urgente y no puede comunicarse con nosotros, puede optar por buscar atencin mdica  en el consultorio de su doctor(a), en una clnica privada, en un centro de atencin urgente o en una sala de emergencias.  Si tiene Engineer, drilling, por favor llame inmediatamente al 911 o vaya a la sala de emergencias.  Nmeros de bper  - Dr. Gwen Pounds: [phone]  - Dra. Roseanne Reno: [phone]  - Dr. Katrinka Blazing: [phone]   En caso de inclemencias del tiempo, por favor llame a Lacy Duverney principal al [phone] para una actualizacin sobre el [place] de  cualquier retraso o cierre.  Consejos para la medicacin en dermatologa: Por favor, guarde las cajas en las que vienen los medicamentos de uso tpico para ayudarle a seguir las instrucciones sobre dnde y cmo usarlos. Las farmacias generalmente imprimen las instrucciones del medicamento slo en las cajas y no directamente en los tubos del [place].   Si su medicamento es muy caro, por favor, pngase en contacto con Rolm Gala llamando al [phone] y presione la opcin 4 o envenos un mensaje a travs de Clinical cytogeneticist.   No podemos decirle cul ser su copago por los medicamentos por adelantado ya que esto es diferente dependiendo de la cobertura de su seguro. Sin embargo, es posible que podamos encontrar un medicamento sustituto a Audiological scientist un formulario para que el seguro cubra el medicamento que se considera necesario.   Si se requiere una autorizacin previa para que su compaa de seguros [place] su medicamento, por favor permtanos de 1 a 2 das hbiles para completar [address]  Los precios de los medicamentos varan con frecuencia dependiendo del Environmental consultant de dnde  se surte la receta y alguna farmacias pueden ofrecer precios ms baratos.  El sitio web www.goodrx.com tiene cupones para medicamentos de Health and safety inspector. Los precios aqu no tienen en cuenta lo que podra costar con la ayuda del seguro (puede ser ms barato con su seguro), pero el sitio web puede darle el precio si no utiliz Tourist information centre manager.  - Puede imprimir el cupn correspondiente y llevarlo con su receta a la farmacia.  - Tambin puede pasar por nuestra oficina durante el horario de atencin regular y Education officer, museum una tarjeta de cupones de GoodRx.  - Si necesita que su receta se enve electrnicamente a una farmacia diferente, informe a nuestra oficina a travs de MyChart de [place] o por telfono llamando al [phone] y presione la opcin 4.

## 2023-12-01 LAB — GLUCOSE 6 PHOSPHATE DEHYDROGENASE
G-6-PD, Quant: 9 U/g{Hb} (ref 4.8–15.7)
Hemoglobin: 13 g/dL (ref 11.1–15.9)

## 2023-12-01 LAB — HEPATITIS C ANTIBODY: Hep C Virus Ab: NONREACTIVE

## 2023-12-01 LAB — HIV ANTIBODY (ROUTINE TESTING W REFLEX): HIV Screen 4th Generation wRfx: NONREACTIVE

## 2023-12-01 LAB — QUANTIFERON-TB GOLD PLUS
QuantiFERON Mitogen Value: 6.96 [IU]/mL
QuantiFERON Nil Value: 0 [IU]/mL
QuantiFERON TB1 Ag Value: 0.01 [IU]/mL
QuantiFERON TB2 Ag Value: 0 [IU]/mL
QuantiFERON-TB Gold Plus: NEGATIVE

## 2023-12-01 LAB — HEPATITIS B SURFACE ANTIGEN: Hepatitis B Surface Ag: NEGATIVE

## 2023-12-01 LAB — HEPATITIS B SURFACE ANTIBODY,QUALITATIVE: Hep B Surface Ab, Qual: NONREACTIVE

## 2023-12-05 ENCOUNTER — Encounter: Payer: Self-pay | Admitting: Dermatology

## 2023-12-05 LAB — SPECIMEN STATUS REPORT

## 2023-12-05 LAB — HEPATITIS B CORE ANTIBODY, TOTAL: Hep B Core Total Ab: NEGATIVE

## 2023-12-07 ENCOUNTER — Other Ambulatory Visit: Payer: Self-pay | Admitting: Dermatology

## 2023-12-07 DIAGNOSIS — Z79899 Other long term (current) drug therapy: Secondary | ICD-10-CM

## 2023-12-07 DIAGNOSIS — L439 Lichen planus, unspecified: Secondary | ICD-10-CM

## 2023-12-07 MED ORDER — METHOTREXATE SODIUM 2.5 MG PO TABS: 10.0000 mg | ORAL_TABLET | ORAL | 0 refills | Status: DC

## 2023-12-07 MED ORDER — FOLIC ACID 1 MG PO TABS: 1.0000 mg | ORAL_TABLET | Freq: Every day | ORAL | 0 refills | Status: DC

## 2023-12-20 ENCOUNTER — Other Ambulatory Visit: Payer: Self-pay | Admitting: Family Medicine

## 2023-12-20 DIAGNOSIS — J302 Other seasonal allergic rhinitis: Secondary | ICD-10-CM

## 2023-12-21 ENCOUNTER — Telehealth: Payer: Self-pay

## 2023-12-21 NOTE — Telephone Encounter (Signed)
This patient called today, she left this message on mychart on Monday for Dr Katrinka Blazing, she would like someone to advise her if she should hold off on Methotrexate until she is feeling better? She was suppose to take methotrexate yesterday but she been waiting for a response     Dr. Katrinka Blazing,  I started feeling allergy like symptoms before starting methotrexate,  I did start it on Wed  Feb 5, I have had nasal congestion  And a cough , it maybe drainage. Can I take delsym 12 hour cough medicine and I have a benadryl allergy congestion for kids. Nasal congestion opening up now everything is clear but the cough is continuing especially at night causing me to lose sleep. I keep thinking it will get better. No fever Still using Fluticasone nasal spray and used a humidifier.    Please  advise. [employer]

## 2023-12-21 NOTE — Telephone Encounter (Signed)
Please respond to both Solmon Ice and myself since Solmon Ice will be leaving at lunch. Thank you!

## 2023-12-21 NOTE — Telephone Encounter (Signed)
Called pt advised her to hold off taking methotrexate until she is over her sickness.

## 2023-12-31 ENCOUNTER — Other Ambulatory Visit: Payer: Self-pay | Admitting: Dermatology

## 2023-12-31 DIAGNOSIS — L439 Lichen planus, unspecified: Secondary | ICD-10-CM

## 2023-12-31 DIAGNOSIS — Z79899 Other long term (current) drug therapy: Secondary | ICD-10-CM

## 2024-01-02 ENCOUNTER — Other Ambulatory Visit: Payer: Self-pay | Admitting: Dermatology

## 2024-01-02 DIAGNOSIS — L439 Lichen planus, unspecified: Secondary | ICD-10-CM

## 2024-01-02 DIAGNOSIS — Z79899 Other long term (current) drug therapy: Secondary | ICD-10-CM

## 2024-01-05 ENCOUNTER — Other Ambulatory Visit: Payer: Self-pay | Admitting: Dermatology

## 2024-01-05 DIAGNOSIS — Z79899 Other long term (current) drug therapy: Secondary | ICD-10-CM

## 2024-01-05 DIAGNOSIS — L439 Lichen planus, unspecified: Secondary | ICD-10-CM

## 2024-01-08 ENCOUNTER — Other Ambulatory Visit: Payer: Self-pay | Admitting: Family Medicine

## 2024-01-08 DIAGNOSIS — J302 Other seasonal allergic rhinitis: Secondary | ICD-10-CM

## 2024-01-10 ENCOUNTER — Other Ambulatory Visit: Payer: Self-pay | Admitting: Family Medicine

## 2024-01-10 DIAGNOSIS — J3089 Other allergic rhinitis: Secondary | ICD-10-CM

## 2024-01-17 ENCOUNTER — Encounter: Payer: Self-pay | Admitting: Dermatology

## 2024-01-19 DIAGNOSIS — Z79899 Other long term (current) drug therapy: Secondary | ICD-10-CM | POA: Diagnosis not present

## 2024-01-19 DIAGNOSIS — L439 Lichen planus, unspecified: Secondary | ICD-10-CM | POA: Diagnosis not present

## 2024-01-20 LAB — COMPREHENSIVE METABOLIC PANEL WITH GFR
ALT: 19 IU/L (ref 0–32)
AST: 20 IU/L (ref 0–40)
Albumin: 4.2 g/dL (ref 3.9–4.9)
Alkaline Phosphatase: 88 IU/L (ref 44–121)
BUN/Creatinine Ratio: 19 (ref 12–28)
BUN: 12 mg/dL (ref 8–27)
Bilirubin Total: 0.2 mg/dL (ref 0.0–1.2)
CO2: 26 mmol/L (ref 20–29)
Calcium: 9.2 mg/dL (ref 8.7–10.3)
Chloride: 98 mmol/L (ref 96–106)
Creatinine, Ser: 0.64 mg/dL (ref 0.57–1.00)
Globulin, Total: 3 g/dL (ref 1.5–4.5)
Glucose: 104 mg/dL — ABNORMAL HIGH (ref 70–99)
Potassium: 4.6 mmol/L (ref 3.5–5.2)
Sodium: 138 mmol/L (ref 134–144)
Total Protein: 7.2 g/dL (ref 6.0–8.5)
eGFR: 99 mL/min/1.73

## 2024-01-20 LAB — CBC WITH DIFFERENTIAL/PLATELET
Basophils Absolute: 0.1 x10E3/uL (ref 0.0–0.2)
Basos: 2 %
EOS (ABSOLUTE): 0.3 x10E3/uL (ref 0.0–0.4)
Eos: 5 %
Hematocrit: 38.2 % (ref 34.0–46.6)
Hemoglobin: 12.4 g/dL (ref 11.1–15.9)
Immature Grans (Abs): 0 x10E3/uL (ref 0.0–0.1)
Immature Granulocytes: 0 %
Lymphocytes Absolute: 1.4 x10E3/uL (ref 0.7–3.1)
Lymphs: 23 %
MCH: 30.1 pg (ref 26.6–33.0)
MCHC: 32.5 g/dL (ref 31.5–35.7)
MCV: 93 fL (ref 79–97)
Monocytes Absolute: 0.5 x10E3/uL (ref 0.1–0.9)
Monocytes: 8 %
Neutrophils Absolute: 3.8 x10E3/uL (ref 1.4–7.0)
Neutrophils: 62 %
Platelets: 343 x10E3/uL (ref 150–450)
RBC: 4.12 x10E6/uL (ref 3.77–5.28)
RDW: 14.2 % (ref 11.7–15.4)
WBC: 6 x10E3/uL (ref 3.4–10.8)

## 2024-01-21 ENCOUNTER — Other Ambulatory Visit: Payer: Self-pay | Admitting: Dermatology

## 2024-01-21 DIAGNOSIS — Z79899 Other long term (current) drug therapy: Secondary | ICD-10-CM

## 2024-01-21 DIAGNOSIS — L439 Lichen planus, unspecified: Secondary | ICD-10-CM

## 2024-01-21 MED ORDER — METHOTREXATE SODIUM 2.5 MG PO TABS: 15.0000 mg | ORAL_TABLET | ORAL | 0 refills | Status: DC

## 2024-01-24 ENCOUNTER — Other Ambulatory Visit: Payer: Self-pay | Admitting: Family Medicine

## 2024-01-24 DIAGNOSIS — J3089 Other allergic rhinitis: Secondary | ICD-10-CM

## 2024-01-24 MED ORDER — FLUTICASONE PROPIONATE 50 MCG/ACT NA SUSP: NASAL | 0 refills | Status: DC

## 2024-02-11 ENCOUNTER — Other Ambulatory Visit: Payer: Self-pay | Admitting: Dermatology

## 2024-02-11 DIAGNOSIS — Z79899 Other long term (current) drug therapy: Secondary | ICD-10-CM

## 2024-02-11 DIAGNOSIS — L439 Lichen planus, unspecified: Secondary | ICD-10-CM

## 2024-02-14 ENCOUNTER — Encounter: Payer: Self-pay | Admitting: Family Medicine

## 2024-02-14 ENCOUNTER — Ambulatory Visit (INDEPENDENT_AMBULATORY_CARE_PROVIDER_SITE_OTHER): Payer: Self-pay | Admitting: Family Medicine

## 2024-02-14 DIAGNOSIS — R7303 Prediabetes: Secondary | ICD-10-CM | POA: Diagnosis not present

## 2024-02-14 DIAGNOSIS — G4700 Insomnia, unspecified: Secondary | ICD-10-CM

## 2024-02-14 DIAGNOSIS — E559 Vitamin D deficiency, unspecified: Secondary | ICD-10-CM

## 2024-02-14 DIAGNOSIS — M25551 Pain in right hip: Secondary | ICD-10-CM

## 2024-02-14 DIAGNOSIS — M62838 Other muscle spasm: Secondary | ICD-10-CM | POA: Diagnosis not present

## 2024-02-14 DIAGNOSIS — I10 Essential (primary) hypertension: Secondary | ICD-10-CM | POA: Diagnosis not present

## 2024-02-14 DIAGNOSIS — R739 Hyperglycemia, unspecified: Secondary | ICD-10-CM

## 2024-02-14 DIAGNOSIS — L209 Atopic dermatitis, unspecified: Secondary | ICD-10-CM

## 2024-02-14 DIAGNOSIS — G8929 Other chronic pain: Secondary | ICD-10-CM

## 2024-02-14 DIAGNOSIS — E785 Hyperlipidemia, unspecified: Secondary | ICD-10-CM | POA: Diagnosis not present

## 2024-02-14 DIAGNOSIS — L439 Lichen planus, unspecified: Secondary | ICD-10-CM | POA: Diagnosis not present

## 2024-02-14 DIAGNOSIS — F411 Generalized anxiety disorder: Secondary | ICD-10-CM | POA: Diagnosis not present

## 2024-02-14 LAB — POCT GLYCOSYLATED HEMOGLOBIN (HGB A1C): Hemoglobin A1C: 6.4 % — AB (ref 4.0–5.6)

## 2024-02-14 MED ORDER — BACLOFEN 5 MG PO TABS: 5.0000 mg | ORAL_TABLET | Freq: Every evening | ORAL | 1 refills | Status: AC | PRN

## 2024-02-14 MED ORDER — ATENOLOL 25 MG PO TABS
12.5000 mg | ORAL_TABLET | Freq: Every day | ORAL | 1 refills | Status: DC
Start: 2024-02-14 — End: 2024-08-12

## 2024-02-14 MED ORDER — AMLODIPINE BESYLATE-VALSARTAN 5-160 MG PO TABS: 1.0000 | ORAL_TABLET | Freq: Every day | ORAL | 1 refills | Status: DC

## 2024-02-14 MED ORDER — TEMAZEPAM 7.5 MG PO CAPS: 7.5000 mg | ORAL_CAPSULE | Freq: Every evening | ORAL | 0 refills | Status: DC | PRN

## 2024-02-14 MED ORDER — ROSUVASTATIN CALCIUM 5 MG PO TABS: 5.0000 mg | ORAL_TABLET | Freq: Every day | ORAL | 1 refills | Status: DC

## 2024-02-14 NOTE — Progress Notes (Signed)
 Name: Christie Mcdonald   MRN: [id number]    DOB: 1961-10-22   Date:02/14/2024       Progress Note  Subjective  Chief Complaint  Chief Complaint  Patient presents with   Medical Management of Chronic Issues   HPI   HTN: she is taking medication as prescribed she is back on Atenolol and Exforge She denies dizziness, chest pain or palpitation. BP is at goal   Right hip pain: she states went shopping over the weekend, walked all day with uncomfortable shoes and noticed pain towards the end of the day on right lateral hip and right quad. She has a history of bulging disc diagnosed by MRI many years ago , at the time pain was on her foot and hip. No bowel or bladder incontinence.    Lichen Planus: diagnosed by dermatologist 8  years ago, on arms legs and feet, raised dark spots, some flat and some raised, mild pruritis at times, she uses topical steroids prn , currently on her mouth and using methotrexate today is week 8    Hyperglycemia/pre-diabetes : she denies polydipsia , polyuria or polyphagia, last hgbA1C was 6.5 % today is down to 6.4 %. We will continue to monitor, she was eating a lot of ice cream but not lately   Mjajor Depression in remission /Anxiety: she has a long history of anxiety and also depression, states that since around July 2018 she felt much worse with no motivation or energy. Changes have been, father died 05-11-2018husband lost his job for 2 months, since Dec brother in law diagnosed with lung cancer and died 05-17-18. She states she never tried Hydroxyzine. She states since 18-Mar-2019 she has been feeling better. She retired in September 2023 and husband retired rin 03-18-23  No longer feeling overwhelmed . She is happy her granddaughter and great granddaughter moved in with them and it makes her happy.     Vitamin D and B12 deficiency: she is taking supplementation. Reviewed recent labs    Obesity: BMI is now below 35 , no longer morbidly obese, she does have  co-morbidities  such at HTN, dyslipidemia, dysthymia. She is still following intermittent fasting, smaller portions and cutting down on carbs.    Dyslipidemia: she is still taking medications - statin    The 10-year ASCVD risk score (Arnett DK, et al., 2018-03-17) is: 8.2%   Values used to calculate the score:     Age: 63 years     Sex: Female     Is Non-Hispanic African American: No     Diabetic: No     Tobacco smoker: No     Systolic Blood Pressure: 138 mmHg     Is BP treated: Yes     HDL Cholesterol: 41 mg/dL     Total Cholesterol: 204 mg/dL    Tension headaches: taking baclofen for that and also for body aches, usually tightness and stiff on her nuchal area, no photophobia, phonophobia. Continue prn medication.   Muscle spasms: she has intermittent muscle spasms  and takes baclofen prn , unchanged   Insomnia: used to take Temazepam and has not been sleeping well lately. Discussed sleep hygiene   Patient Active Problem List   Diagnosis Date Noted   Chronic desquamative gingivitis 11/27/2023   Lichen sclerosus 02/21/2023   Depression, major, in remission (HCC) 02/21/2023   B12 deficiency 02/21/2023   Tension headache 02/21/2023   Systolic ejection murmur 01/16/2018   Gastro-esophageal reflux disease without esophagitis 09/20/2016  Menopause 09/20/2016   Temporomandibular joint-pain-dysfunction syndrome 09/20/2016   Hyperglycemia 09/20/2016   Vitamin D deficiency 12/22/2015   Essential hypertension 04/23/2015   Dyslipidemia (high LDL; low HDL) 04/23/2015    Past Surgical History:  Procedure Laterality Date   ABDOMINAL HYSTERECTOMY  1991   BREAST BIOPSY Left 1997   negative   COLONOSCOPY WITH PROPOFOL N/A 06/10/2015   Procedure: COLONOSCOPY WITH PROPOFOL;  Surgeon: Kieth Brightly, MD;  Location: [facility] ENDOSCOPY;  Service: Endoscopy;  Laterality: N/A;   HEMORRHOID SURGERY  1988   Dr Lorelee New    Family History  Problem Relation Age of Onset   Diabetes Mother    Alzheimer's  disease Mother    Heart disease Mother        CHF   Stroke Mother    Dementia Mother    Stroke Father    Kidney disease Father    Parkinson's disease Father    Hypertension Sister    Diabetes Sister    Diabetes Maternal Grandmother    Breast cancer Paternal Grandfather    Cancer Paternal Grandfather        Breast   Breast cancer Cousin        maternal-twice    Social History   Tobacco Use   Smoking status: Former    Current packs/day: 0.00    Types: Cigarettes    Quit date: 06/26/2014    Years since quitting: 9.6   Smokeless tobacco: Never  Substance Use Topics   Alcohol use: No    Alcohol/week: 0.0 standard drinks of alcohol     Current Outpatient Medications:    amLODipine-valsartan (EXFORGE) 5-160 MG tablet, Take 1 tablet by mouth daily., Disp: 90 tablet, Rfl: 1   atenolol (TENORMIN) 25 MG tablet, Take 0.5 tablets (12.5 mg total) by mouth daily., Disp: 45 tablet, Rfl: 1   Baclofen 5 MG TABS, Take 1 tablet (5 mg total) by mouth at bedtime as needed., Disp: 90 tablet, Rfl: 1   chlorhexidine (PERIDEX) 0.12 % solution, 2 (two) times daily., Disp: , Rfl:    Cholecalciferol (VITAMIN D3) 50 MCG (2000 UT) capsule, Take 1 capsule (2,000 Units total) by mouth daily., Disp: 100 capsule, Rfl: 0   Cyanocobalamin (B-12 SL), Place 1 drop under the tongue daily., Disp: , Rfl:    fluocinonide gel (LIDEX) 0.05 %, SMARTSIG:Sparingly Topical 2-3 Times Daily, Disp: , Rfl:    fluticasone (FLONASE) 50 MCG/ACT nasal spray, SPRAY 2 SPRAYS INTO EACH NOSTRIL EVERY DAY, Disp: 16 mL, Rfl: 0   folic acid (FOLVITE) 1 MG tablet, TAKE 1 TABLET (1 MG TOTAL) BY MOUTH DAILY. EXCEPT ON METHOTREXATE DAY, Disp: 90 tablet, Rfl: 1   hydrocortisone (ANUSOL-HC) 25 MG suppository, Place 25 mg rectally 2 (two) times daily., Disp: , Rfl:    methotrexate (RHEUMATREX) 2.5 MG tablet, TAKE 6 TABLETS BY MOUTH ONE TIME PER WEEK, Disp: 72 tablet, Rfl: 0   psyllium (METAMUCIL) 58.6 % packet, Take 1 packet by mouth daily.,  Disp: , Rfl:    rosuvastatin (CRESTOR) 5 MG tablet, Take 1 tablet (5 mg total) by mouth daily., Disp: 90 tablet, Rfl: 1   triamcinolone ointment (KENALOG) 0.1 %, APPLY ON AFFECTED AREA TWICE DAILY AS NEEDED., Disp: , Rfl:   Allergies  Allergen Reactions   Hydrochlorothiazide Other (See Comments)   Lisinopril Cough    I personally reviewed active problem list, medication list, allergies with the patient/caregiver today.   ROS  Ten systems reviewed and is negative except as mentioned in  HPI    Objective Physical Exam Constitutional: Patient appears well-developed and well-nourished. Obese  No distress.  HEENT: head atraumatic, normocephalic, pupils equal and reactive to light, neck supple Cardiovascular: Normal rate, regular rhythm and normal heart sounds.  No murmur heard. No BLE edema. Pulmonary/Chest: Effort normal and breath sounds normal. No respiratory distress. Abdominal: Soft.  There is no tenderness. Muscular skeletal: normal rom of back , pain during palpation of lower back , negative straight leg raise, normal rom of hip  Psychiatric: Patient has a normal mood and affect. behavior is normal. Judgment and thought content normal.   Vitals:   02/14/24 1022  BP: 138/86  Pulse: 88  Resp: 16  SpO2: 95%  Weight: 177 lb 12.8 oz (80.6 kg)  Height: 5\' 1"  (1.549 m)    Body mass index is 33.6 kg/m.  Recent Results (from the past 2160 hours)  Hepatitis B surface antigen     Status: None   Collection Time: 11/27/23 10:02 AM  Result Value Ref Range   Hepatitis B Surface Ag Negative Negative  Hepatitis B surface antibody,qualitative     Status: None   Collection Time: 11/27/23 10:02 AM  Result Value Ref Range   Hep B Surface Ab, Qual Non Reactive     Comment:              Non Reactive: Not immune to HBV infection.              Equivocal: Unable to determine if anti-HBs                         is present at levels consistent                         with immunity.                Reactive: Anti-HBs concentration detected                         at greater than 10 mIU/mL.                         Individual is considered to be                         immune to infection with HBV.   Hepatitis C antibody     Status: None   Collection Time: 11/27/23 10:02 AM  Result Value Ref Range   Hep C Virus Ab Non Reactive Non Reactive    Comment: HCV antibody alone does not differentiate between previously resolved infection and active infection. Equivocal and Reactive HCV antibody results should be followed up with an HCV RNA test to support the diagnosis of active HCV infection.   HIV Antibody (routine testing w rflx)     Status: None   Collection Time: 11/27/23 10:02 AM  Result Value Ref Range   HIV Screen 4th Generation wRfx Non Reactive Non Reactive    Comment: HIV-1/HIV-2 antibodies and HIV-1 p24 antigen were NOT detected. There is no laboratory evidence of HIV infection. HIV Negative   Glucose 6 phosphate dehydrogenase     Status: None   Collection Time: 11/27/23 10:02 AM  Result Value Ref Range   Hemoglobin 13.0 11.1 - 15.9 g/dL   W-2-NF, Quant 9.0 4.8 - 15.7 U/g Hb    Comment: When  decreased, G-6-PD, Quant. values are associated with acute hemolytic anemia when deficient individuals are exposed to oxidative stress, such as with certain medications (e.g., primaquine), infection, or ingestion of fava beans. Caution: In patients with acute hemolysis (e.g., abnormally low RBC values), testing for G-6-PD may be falsely normal because older erythrocytes with a higher enzyme deficiency have been hemolyzed. Young erythrocytes and reticulocytes have normal or near-normal enzyme activity. Normal values of G-6-PD may be measured for several weeks following a hemolytic event.   QuantiFERON-TB Gold Plus     Status: None   Collection Time: 11/27/23 10:02 AM  Result Value Ref Range   QuantiFERON Incubation Incubation performed.    QuantiFERON Criteria Comment      Comment: QuantiFERON-TB Gold Plus is a qualitative indirect test for M tuberculosis infection (including disease) and is intended for use in conjunction with risk assessment, radiography, and other medical and diagnostic evaluations. The QuantiFERON-TB Gold Plus result is determined by subtracting the Nil value from either TB antigen (Ag) value. The Mitogen tube serves as a control for the test.    QuantiFERON TB1 Ag Value 0.01 IU/mL   QuantiFERON TB2 Ag Value 0.00 IU/mL   QuantiFERON Nil Value 0.00 IU/mL   QuantiFERON Mitogen Value 6.96 IU/mL   QuantiFERON-TB Gold Plus Negative Negative    Comment: No response to M tuberculosis antigens detected. Infection with M tuberculosis is unlikely, but high risk individuals should be considered for additional testing (ATS/IDSA/CDC Clinical Practice Guidelines, 2017). The reference range is an Antigen minus Nil result of <0.35 IU/mL. Chemiluminescence immunoassay methodology   Hepatitis B core antibody, total     Status: None   Collection Time: 11/27/23 10:02 AM  Result Value Ref Range   Hep B Core Total Ab Negative Negative  Specimen status report     Status: None   Collection Time: 11/27/23 10:02 AM  Result Value Ref Range   specimen status report Comment     Comment: Written Authorization Written Authorization Written Authorization Received. Authorization received from [facility] CMA 12-05-2023 Logged by Lawernce Pitts   CBC with Differential/Platelets     Status: None   Collection Time: 01/19/24 10:39 AM  Result Value Ref Range   WBC 6.0 3.4 - 10.8 x10E3/uL   RBC 4.12 3.77 - 5.28 x10E6/uL   Hemoglobin 12.4 11.1 - 15.9 g/dL   Hematocrit 16.1 09.6 - 46.6 %   MCV 93 79 - 97 fL   MCH 30.1 26.6 - 33.0 pg   MCHC 32.5 31.5 - 35.7 g/dL   RDW 04.5 40.9 - 81.1 %   Platelets 343 150 - 450 x10E3/uL   Neutrophils 62 Not Estab. %   Lymphs 23 Not Estab. %   Monocytes 8 Not Estab. %   Eos 5 Not Estab. %   Basos 2 Not Estab. %    Neutrophils Absolute 3.8 1.4 - 7.0 x10E3/uL   Lymphocytes Absolute 1.4 0.7 - 3.1 x10E3/uL   Monocytes Absolute 0.5 0.1 - 0.9 x10E3/uL   EOS (ABSOLUTE) 0.3 0.0 - 0.4 x10E3/uL   Basophils Absolute 0.1 0.0 - 0.2 x10E3/uL   Immature Granulocytes 0 Not Estab. %   Immature Grans (Abs) 0.0 0.0 - 0.1 x10E3/uL  CMP     Status: Abnormal   Collection Time: 01/19/24 10:39 AM  Result Value Ref Range   Glucose 104 (H) 70 - 99 mg/dL   BUN 12 8 - 27 mg/dL   Creatinine, Ser 9.14 0.57 - 1.00 mg/dL   eGFR 99 >78 GN/FAO/1.30  BUN/Creatinine Ratio 19 12 - 28   Sodium 138 134 - 144 mmol/L   Potassium 4.6 3.5 - 5.2 mmol/L   Chloride 98 96 - 106 mmol/L   CO2 26 20 - 29 mmol/L   Calcium 9.2 8.7 - 10.3 mg/dL   Total Protein 7.2 6.0 - 8.5 g/dL   Albumin 4.2 3.9 - 4.9 g/dL   Globulin, Total 3.0 1.5 - 4.5 g/dL   Bilirubin Total 0.2 0.0 - 1.2 mg/dL   Alkaline Phosphatase 88 44 - 121 IU/L   AST 20 0 - 40 IU/L   ALT 19 0 - 32 IU/L  POCT glycosylated hemoglobin (Hb A1C)     Status: Abnormal   Collection Time: 02/14/24 10:26 AM  Result Value Ref Range   Hemoglobin A1C 6.4 (A) 4.0 - 5.6 %   HbA1c POC (<> result, manual entry)     HbA1c, POC (prediabetic range)     HbA1c, POC (controlled diabetic range)      Diabetic Foot Exam:     PHQ2/9:    02/14/2024   10:21 AM 08/23/2023   10:27 AM 02/21/2023    3:09 PM 11/11/2022   10:56 AM 11/11/2022   10:54 AM  Depression screen PHQ 2/9  Decreased Interest 0 0 0 0 0  Down, Depressed, Hopeless 0 0 0 0 0  PHQ - 2 Score 0 0 0 0 0  Altered sleeping 0 0 0 0 0  Tired, decreased energy 0 0 0 0 0  Change in appetite 0 0 0 0 0  Feeling bad or failure about yourself  0 0 0 0 0  Trouble concentrating 0 0 0 0 0  Moving slowly or fidgety/restless 0 0 0 0 0  Suicidal thoughts 0 0 0 0 0  PHQ-9 Score 0 0 0 0 0  Difficult doing work/chores Not difficult at all   Not difficult at all Not difficult at all    phq 9 is negative  Fall Risk:    08/23/2023   10:27 AM  02/21/2023    3:09 PM 11/11/2022   10:56 AM 11/11/2022   10:54 AM 10/19/2022   10:17 AM  Fall Risk   Falls in the past year? 0 0 0 0 0  Number falls in past yr:  0 0 0   Injury with Fall?  0 0 0   Risk for fall due to : No Fall Risks No Fall Risks   No Fall Risks  Follow up Falls prevention discussed Falls prevention discussed   Falls prevention discussed;Falls evaluation completed;Education provided     Assessment & Plan  1. Hyperglycemia  - POCT glycosylated hemoglobin (Hb A1C)  2. Morbid obesity (HCC) (Primary)  Discussed with the patient the risk posed by an increased BMI. Discussed importance of portion control, calorie counting and at least 150 minutes of physical activity weekly. Avoid sweet beverages and drink more water. Eat at least 6 servings of fruit and vegetables daily    3. Atopic dermatitis, unspecified type  Seeing dermatologist   4. Lichen planus  Keep visit with dermatologist   5. Pre-diabetes  Reviewed low carbohydrate diet  6. GAD (generalized anxiety disorder)  stable  7. Vitamin D deficiency  Continue supplements  8. Dyslipidemia (high LDL; low HDL)  - rosuvastatin (CRESTOR) 5 MG tablet; Take 1 tablet (5 mg total) by mouth daily.  Dispense: 90 tablet; Refill: 1  9. Essential hypertension  - atenolol (TENORMIN) 25 MG tablet; Take 0.5 tablets (12.5 mg total)  by mouth daily.  Dispense: 45 tablet; Refill: 1 - amLODipine-valsartan (EXFORGE) 5-160 MG tablet; Take 1 tablet by mouth daily.  Dispense: 90 tablet; Refill: 1  10. Muscle spasm  - Baclofen 5 MG TABS; Take 1 tablet (5 mg total) by mouth at bedtime as needed.  Dispense: 90 tablet; Refill: 1  11. Insomnia, unspecified type  - temazepam (RESTORIL) 7.5 MG capsule; Take 1 capsule (7.5 mg total) by mouth at bedtime as needed for sleep.  Dispense: 90 capsule; Refill: 0  12. Chronic right hip pain  Advised to take tylenol 500 mg qid prn

## 2024-02-22 ENCOUNTER — Other Ambulatory Visit: Payer: Self-pay | Admitting: Family Medicine

## 2024-02-22 DIAGNOSIS — J302 Other seasonal allergic rhinitis: Secondary | ICD-10-CM

## 2024-02-26 ENCOUNTER — Other Ambulatory Visit: Payer: Self-pay | Admitting: Family Medicine

## 2024-02-26 DIAGNOSIS — J302 Other seasonal allergic rhinitis: Secondary | ICD-10-CM

## 2024-03-04 ENCOUNTER — Other Ambulatory Visit: Payer: Self-pay | Admitting: Family Medicine

## 2024-03-04 DIAGNOSIS — J302 Other seasonal allergic rhinitis: Secondary | ICD-10-CM

## 2024-03-04 MED ORDER — FLUTICASONE PROPIONATE 50 MCG/ACT NA SUSP
NASAL | 0 refills | Status: DC
Start: 2024-03-04 — End: 2024-03-05

## 2024-03-05 ENCOUNTER — Telehealth: Payer: Self-pay

## 2024-03-05 ENCOUNTER — Other Ambulatory Visit: Payer: Self-pay | Admitting: Family Medicine

## 2024-03-05 DIAGNOSIS — J3089 Other allergic rhinitis: Secondary | ICD-10-CM

## 2024-03-05 MED ORDER — FLUTICASONE PROPIONATE 50 MCG/ACT NA SUSP: NASAL | 2 refills | Status: DC

## 2024-03-05 NOTE — Telephone Encounter (Signed)
 Copied from CRM [id number]. Topic: General - Other >> Mar 05, 2024 11:52 AM Emylou G wrote: Reason for CRM: Patient called said fluticasone  (FLONASE ) 50 MCG/ACT nasal spray says no refills?  Is there an issue?  Pls call her back number on file is good She just picked up a fill but says zero refills?

## 2024-03-07 ENCOUNTER — Encounter: Payer: Self-pay | Admitting: Dermatology

## 2024-03-08 ENCOUNTER — Telehealth: Payer: Self-pay | Admitting: Dermatology

## 2024-03-08 DIAGNOSIS — L439 Lichen planus, unspecified: Secondary | ICD-10-CM

## 2024-03-08 DIAGNOSIS — Z79899 Other long term (current) drug therapy: Secondary | ICD-10-CM

## 2024-03-08 MED ORDER — FOLIC ACID 1 MG PO TABS: 1.0000 mg | ORAL_TABLET | Freq: Every day | ORAL | 1 refills | Status: DC

## 2024-03-08 NOTE — Telephone Encounter (Signed)
 Spoke to patient by phone. Gums were flaring. Better last week after allergy pill less bleeding. Flares behind teeth, feels like bubble skin sloughing Taking folic acid  and methotrexate  as prescribed (mtx 6 tablets on Wednesdays), folic acid  1 mg daily buy forgot folic two days in last two weeks Give methotrexate  more time and check labs before increasing Offered Prednisone taper but patient reports gums have been improving for the last two days and wants to hold on pred Better today. Worse with coughing All questions answered

## 2024-03-13 ENCOUNTER — Telehealth: Payer: Self-pay

## 2024-03-13 ENCOUNTER — Encounter: Payer: Self-pay | Admitting: Dermatology

## 2024-03-13 ENCOUNTER — Encounter: Payer: Self-pay | Admitting: Family Medicine

## 2024-03-13 ENCOUNTER — Ambulatory Visit: Admitting: Family Medicine

## 2024-03-13 VITALS — BP 132/84 | HR 79 | Temp 98.1°F | Resp 16 | Ht 61.0 in | Wt 178.2 lb

## 2024-03-13 DIAGNOSIS — Z79899 Other long term (current) drug therapy: Secondary | ICD-10-CM | POA: Diagnosis not present

## 2024-03-13 DIAGNOSIS — H60392 Other infective otitis externa, left ear: Secondary | ICD-10-CM

## 2024-03-13 DIAGNOSIS — D84821 Immunodeficiency due to drugs: Secondary | ICD-10-CM

## 2024-03-13 MED ORDER — PREDNISONE 10 MG PO TABS: 10.0000 mg | ORAL_TABLET | Freq: Two times a day (BID) | ORAL | 0 refills | Status: DC

## 2024-03-13 MED ORDER — AMOXICILLIN-POT CLAVULANATE 875-125 MG PO TABS: 1.0000 | ORAL_TABLET | Freq: Two times a day (BID) | ORAL | 0 refills | Status: DC

## 2024-03-13 NOTE — Progress Notes (Signed)
 Name: Christie Mcdonald   MRN: [id number]    DOB: Feb 26, 1961   Date:03/13/2024       Progress Note  Subjective  Chief Complaint  Chief Complaint  Patient presents with   Ear Pain    L ear pain for a few days, last night was throbbing pain   Nasal Congestion    On going for 2 weeks    Discussed the use of AI scribe software for clinical note transcription with the patient, who gave verbal consent to proceed.  History of Present Illness Christie Mcdonald is a 63 year old female who presents with ear pain and drainage.  She has been experiencing congestion for the past two weeks, initially with drainage down her throat and production of white and clear mucus. Over the last two to three days, the symptoms have progressed to her ear, with significant pain and drainage noted last night. The pain is severe, affecting her ability to chew, and touching the outside of the ear is painful.  She has been taking children's Benadryl, 5 mg, to manage the congestion, but notes that decongestants typically elevate her blood pressure. Her blood pressure was 132/86 this morning, which she considers slightly high for her. She also mentions taking methotrexate , with today being her scheduled day for the medication.  In the past, she experienced similar ear swelling a couple of years ago during a cold, which required ear drops. She has a history of eczema, which she suspects might be contributing to the current ear symptoms. No fever, chills, or changes in appetite. She also reports a history of using ear drops about three years ago for a similar issue.  During the review of symptoms, she denies facial tenderness and reports no fever or chills. She mentions a recent dental visit where she experienced significant bleeding due to skin sloughing, but this was unrelated to her current ear pain.     Patient Active Problem List   Diagnosis Date Noted   Chronic desquamative gingivitis 11/27/2023   Lichen sclerosus  02/21/2023   Depression, major, in remission (HCC) 02/21/2023   B12 deficiency 02/21/2023   Tension headache 02/21/2023   Systolic ejection murmur 01/16/2018   Gastro-esophageal reflux disease without esophagitis 09/20/2016   Menopause 09/20/2016   Temporomandibular joint-pain-dysfunction syndrome 09/20/2016   Hyperglycemia 09/20/2016   Vitamin D  deficiency 12/22/2015   Essential hypertension 04/23/2015   Dyslipidemia (high LDL; low HDL) 04/23/2015    Social History   Tobacco Use   Smoking status: Former    Current packs/day: 0.00    Types: Cigarettes    Quit date: 06/26/2014    Years since quitting: 9.7   Smokeless tobacco: Never  Substance Use Topics   Alcohol use: No    Alcohol/week: 0.0 standard drinks of alcohol     Current Outpatient Medications:    amLODipine -valsartan  (EXFORGE ) 5-160 MG tablet, Take 1 tablet by mouth daily., Disp: 90 tablet, Rfl: 1   atenolol  (TENORMIN ) 25 MG tablet, Take 0.5 tablets (12.5 mg total) by mouth daily., Disp: 45 tablet, Rfl: 1   Baclofen  5 MG TABS, Take 1 tablet (5 mg total) by mouth at bedtime as needed., Disp: 90 tablet, Rfl: 1   chlorhexidine (PERIDEX) 0.12 % solution, 2 (two) times daily., Disp: , Rfl:    Cholecalciferol (VITAMIN D3) 50 MCG (2000 UT) capsule, Take 1 capsule (2,000 Units total) by mouth daily., Disp: 100 capsule, Rfl: 0   Cyanocobalamin  (B-12 SL), Place 1 drop under the tongue daily., Disp: ,  Rfl:    fluocinonide gel (LIDEX) 0.05 %, SMARTSIG:Sparingly Topical 2-3 Times Daily, Disp: , Rfl:    fluticasone  (FLONASE ) 50 MCG/ACT nasal spray, SPRAY 2 SPRAYS INTO EACH NOSTRIL EVERY DAY, Disp: 16 mL, Rfl: 2   folic acid  (FOLVITE ) 1 MG tablet, Take 1 tablet (1 mg total) by mouth daily. Except on methotrexate  day, Disp: 90 tablet, Rfl: 1   hydrocortisone (ANUSOL-HC) 25 MG suppository, Place 25 mg rectally 2 (two) times daily., Disp: , Rfl:    methotrexate  (RHEUMATREX) 2.5 MG tablet, TAKE 6 TABLETS BY MOUTH ONE TIME PER WEEK, Disp:  72 tablet, Rfl: 0   psyllium (METAMUCIL) 58.6 % packet, Take 1 packet by mouth daily., Disp: , Rfl:    rosuvastatin  (CRESTOR ) 5 MG tablet, Take 1 tablet (5 mg total) by mouth daily., Disp: 90 tablet, Rfl: 1   temazepam  (RESTORIL ) 7.5 MG capsule, Take 1 capsule (7.5 mg total) by mouth at bedtime as needed for sleep., Disp: 90 capsule, Rfl: 0   triamcinolone ointment (KENALOG) 0.1 %, APPLY ON AFFECTED AREA TWICE DAILY AS NEEDED., Disp: , Rfl:   Allergies  Allergen Reactions   Hydrochlorothiazide Other (See Comments)   Lisinopril Cough    ROS  Ten systems reviewed and is negative except as mentioned in HPI    Objective  Vitals:   03/13/24 1010  BP: 132/84  Pulse: 79  Resp: 16  Temp: 98.1 F (36.7 C)  TempSrc: Oral  SpO2: 98%  Weight: 178 lb 3.2 oz (80.8 kg)  Height: 5\' 1"  (1.549 m)    Body mass index is 33.67 kg/m.  Physical Exam VITALS: BP- 132/86 CONSTITUTIONAL: Patient appears well-developed and well-nourished. No distress. HEENT: Head atraumatic, normocephalic, neck supple. Nasal mucosa congested, left more than right. Left ear canal swollen, difficult to examine. No sinus tenderness. Throat normal. CARDIOVASCULAR: Normal rate, regular rhythm and normal heart sounds. No murmur heard. No BLE edema. PULMONARY: Effort normal and breath sounds normal. No respiratory distress. Lungs clear to auscultation. PSYCHIATRIC: Patient has a normal mood and affect. Behavior is normal. Judgment and thought content normal.  Recent Results (from the past 2160 hours)  CBC with Differential/Platelets     Status: None   Collection Time: 01/19/24 10:39 AM  Result Value Ref Range   WBC 6.0 3.4 - 10.8 x10E3/uL   RBC 4.12 3.77 - 5.28 x10E6/uL   Hemoglobin 12.4 11.1 - 15.9 g/dL   Hematocrit 54.0 98.1 - 46.6 %   MCV 93 79 - 97 fL   MCH 30.1 26.6 - 33.0 pg   MCHC 32.5 31.5 - 35.7 g/dL   RDW 19.1 47.8 - 29.5 %   Platelets 343 150 - 450 x10E3/uL   Neutrophils 62 Not Estab. %   Lymphs 23  Not Estab. %   Monocytes 8 Not Estab. %   Eos 5 Not Estab. %   Basos 2 Not Estab. %   Neutrophils Absolute 3.8 1.4 - 7.0 x10E3/uL   Lymphocytes Absolute 1.4 0.7 - 3.1 x10E3/uL   Monocytes Absolute 0.5 0.1 - 0.9 x10E3/uL   EOS (ABSOLUTE) 0.3 0.0 - 0.4 x10E3/uL   Basophils Absolute 0.1 0.0 - 0.2 x10E3/uL   Immature Granulocytes 0 Not Estab. %   Immature Grans (Abs) 0.0 0.0 - 0.1 x10E3/uL  CMP     Status: Abnormal   Collection Time: 01/19/24 10:39 AM  Result Value Ref Range   Glucose 104 (H) 70 - 99 mg/dL   BUN 12 8 - 27 mg/dL   Creatinine, Ser  0.64 0.57 - 1.00 mg/dL   eGFR 99 >16 XW/RUE/4.54   BUN/Creatinine Ratio 19 12 - 28   Sodium 138 134 - 144 mmol/L   Potassium 4.6 3.5 - 5.2 mmol/L   Chloride 98 96 - 106 mmol/L   CO2 26 20 - 29 mmol/L   Calcium  9.2 8.7 - 10.3 mg/dL   Total Protein 7.2 6.0 - 8.5 g/dL   Albumin 4.2 3.9 - 4.9 g/dL   Globulin, Total 3.0 1.5 - 4.5 g/dL   Bilirubin Total 0.2 0.0 - 1.2 mg/dL   Alkaline Phosphatase 88 44 - 121 IU/L   AST 20 0 - 40 IU/L   ALT 19 0 - 32 IU/L  POCT glycosylated hemoglobin (Hb A1C)     Status: Abnormal   Collection Time: 02/14/24 10:26 AM  Result Value Ref Range   Hemoglobin A1C 6.4 (A) 4.0 - 5.6 %   HbA1c POC (<> result, manual entry)     HbA1c, POC (prediabetic range)     HbA1c, POC (controlled diabetic range)       Assessment & Plan Hypertension Blood pressure 132/86, slightly elevated. Decongestants elevate blood pressure. - Avoid decongestants.  Acute otitis externa Right ear pain, swelling, drainage.. No fever. Possible exacerbation by seborrheic dermatitis or eczema.  Augmentin chosen for broad coverage. Prednisone prescribed for inflammation/swelling  - Prescribe Augmentin for 7 days. - Prescribe prednisone 10 mg twice daily with food for 3 days. - Delay methotrexate  until Saturday if symptoms improve. - Contact dermatologist since they prescribe the methotrexate  . - Follow up if symptoms do not improve or  worsen.  Lichen planus On methotrexate  for lichen planus. - Continue methotrexate  as directed, with temporary delay due to otitis externa.

## 2024-03-13 NOTE — Telephone Encounter (Signed)
 Patient called and left nurse voicemail that she was diagnosed with a ear infection today. She has been prescribed Amoxicillin, Augmentin and Prednisone by other physican and would like to know should she continue methotrexate  at this time?

## 2024-03-21 ENCOUNTER — Ambulatory Visit: Admitting: Dermatology

## 2024-03-21 ENCOUNTER — Encounter: Payer: Self-pay | Admitting: Dermatology

## 2024-03-21 DIAGNOSIS — L439 Lichen planus, unspecified: Secondary | ICD-10-CM | POA: Diagnosis not present

## 2024-03-21 DIAGNOSIS — K0510 Chronic gingivitis, plaque induced: Secondary | ICD-10-CM | POA: Diagnosis not present

## 2024-03-21 DIAGNOSIS — Z7189 Other specified counseling: Secondary | ICD-10-CM

## 2024-03-21 DIAGNOSIS — Z79899 Other long term (current) drug therapy: Secondary | ICD-10-CM

## 2024-03-21 DIAGNOSIS — H60333 Swimmer's ear, bilateral: Secondary | ICD-10-CM | POA: Diagnosis not present

## 2024-03-21 NOTE — Progress Notes (Signed)
   Follow-Up Visit   Subjective  Christie Mcdonald is a 63 y.o. female who presents for the following: lichen planus at oral mucosa. Patient has been taking TMX but has missed last 2 doses due to taking Augmentin  for ear infection, finished course yesterday. Also did a 3 day course of prednisone . Patient is scheduled to see ENT today.  Patient saw dentist last Tuesday for a cleaning and was given chlorhexidine rinse which has helped with sloughing.   The patient has spots, moles and lesions to be evaluated, some may be new or changing and the patient may have concern these could be cancer.   The following portions of the chart were reviewed this encounter and updated as appropriate: medications, allergies, medical history  Review of Systems:  No other skin or systemic complaints except as noted in HPI or Assessment and Plan.  Objective  Well appearing patient in no apparent distress; mood and affect are within normal limits.   A focused examination was performed of the following areas: Mouth and oral mucosa  Relevant exam findings are noted in the Assessment and Plan.    Assessment & Plan   CHRONIC DESQUAMATIVE GINGIVITIS, partially improved on methotrexate  despite some doses having to be skipped oral mucosa Clinical diagnosis: erosive gingivitis. 03/22/23 oral biopsy confirms clinical diagnosis but is nonspecific. Ddx includes lichen planus (most likely given multiple past skin biopsies with lichen planus) vs mucous membrane pemphigoid   Exam: clearance of gingivitis on maxillary facial gingiva. Partial clearance but partial persistence of gingivitis on mandibular facial. Ulceration with pseudomembrane on mandibular lingual gingiva   Continue methotrexate  15 mg once weekly + 1 mg folic acid  on other days. Patient will ask ENT at her appointment today if they agree with her restarting the methotrexate .   Labs from March reviewed, WNL. Consider increasing methotrexate  dose after  having labs done in June. Will send patient reminder in 1 month to have labs drawn.   Potential side effects of methotrexate  include immunosuppression, mouth sores, GI upset including nausea, vomiting, and diarrhea, liver inflammation, liver fibrosis (more commonly seen with long term use), bone marrow suppression (decrease in white and red blood cell counts), drug interactions (do not take with sulfa-based antibiotics), and rare pulmonary fibrosis.  It is designated pregnancy category X- do not take if pregnant or trying to get pregnant.  Avoid drinking alcohol while taking methotrexate .  Due to potential side effects, long term medication management with regular office visits and periodic blood testing is necessary while taking methotrexate .      CHRONIC DESQUAMATIVE GINGIVITIS   LONG-TERM USE OF HIGH-RISK MEDICATION   Related Medications methotrexate  (RHEUMATREX) 2.5 MG tablet TAKE 6 TABLETS BY MOUTH ONE TIME PER WEEK folic acid  (FOLVITE ) 1 MG tablet Take 1 tablet (1 mg total) by mouth daily. Except on methotrexate  day LICHEN PLANUS   Related Medications methotrexate  (RHEUMATREX) 2.5 MG tablet TAKE 6 TABLETS BY MOUTH ONE TIME PER WEEK folic acid  (FOLVITE ) 1 MG tablet Take 1 tablet (1 mg total) by mouth daily. Except on methotrexate  day COUNSELING AND COORDINATION OF CARE    Return in about 3 months (around 06/21/2024) for with Dr. Felipe Horton.  Kerstin Peeling, RMA, am acting as scribe for Harris Liming, MD .   Documentation: I have reviewed the above documentation for accuracy and completeness, and I agree with the above.  Harris Liming, MD

## 2024-03-21 NOTE — Patient Instructions (Signed)
 Potential side effects of methotrexate  include immunosuppression, mouth sores, GI upset including nausea, vomiting, and diarrhea, liver inflammation, liver fibrosis (more commonly seen with long term use), bone marrow suppression (decrease in white and red blood cell counts), drug interactions (do not take with sulfa-based antibiotics), and rare pulmonary fibrosis.  It is designated pregnancy category X- do not take if pregnant or trying to get pregnant.  Avoid drinking alcohol while taking methotrexate .  Due to potential side effects, long term medication management with regular office visits and periodic blood testing is necessary while taking methotrexate .  Due to recent changes in healthcare laws, you may see results of your pathology and/or laboratory studies on MyChart before the doctors have had a chance to review them. We understand that in some cases there may be results that are confusing or concerning to you. Please understand that not all results are received at the same time and often the doctors may need to interpret multiple results in order to provide you with the best plan of care or course of treatment. Therefore, we ask that you please give us  2 business days to thoroughly review all your results before contacting the office for clarification. Should we see a critical lab result, you will be contacted sooner.   If You Need Anything After Your Visit  If you have any questions or concerns for your doctor, please call our main line at [phone] and press option 4 to reach your doctor's medical assistant. If no one answers, please leave a voicemail as directed and we will return your call as soon as possible. Messages left after 4 pm will be answered the following business day.   You may also send us  a message via MyChart. We typically respond to MyChart messages within 1-2 business days.  For prescription refills, please ask your pharmacy to contact our office. Our fax number is  [phone].  If you have an urgent issue when the clinic is closed that cannot wait until the next business day, you can page your doctor at the number below.    Please note that while we do our best to be available for urgent issues outside of office hours, we are not available 24/7.   If you have an urgent issue and are unable to reach us , you may choose to seek medical care at your doctor's office, retail clinic, urgent care center, or emergency room.  If you have a medical emergency, please immediately call 911 or go to the emergency department.  Pager Numbers  - Dr. Bary Likes: [phone]  - Dr. Annette Barters: [phone]  - Dr. Felipe Horton: [phone]   In the event of inclement weather, please call our main line at [phone] for an update on the status of any delays or closures.  Dermatology Medication Tips: Please keep the boxes that topical medications come in in order to help keep track of the instructions about where and how to use these. Pharmacies typically print the medication instructions only on the boxes and not directly on the medication tubes.   If your medication is too expensive, please contact our office at [phone] option 4 or send us  a message through MyChart.   We are unable to tell what your co-pay for medications will be in advance as this is different depending on your insurance coverage. However, we may be able to find a substitute medication at lower cost or fill out paperwork to get insurance to cover a needed medication.   If a prior authorization is required  to get your medication covered by your insurance company, please allow us  1-2 business days to complete this process.  Drug prices often vary depending on where the prescription is filled and some pharmacies may offer cheaper prices.  The website www.goodrx.com contains coupons for medications through different pharmacies. The prices here do not account for what the cost may be with help from  insurance (it may be cheaper with your insurance), but the website can give you the price if you did not use any insurance.  - You can print the associated coupon and take it with your prescription to the pharmacy.  - You may also stop by our office during regular business hours and pick up a GoodRx coupon card.  - If you need your prescription sent electronically to a different pharmacy, notify our office through [facility] or by phone at [phone] option 4.     Si Usted Necesita Algo Despus de Su Visita  Tambin puede enviarnos un mensaje a travs de Clinical cytogeneticist. Por lo general respondemos a los mensajes de MyChart en el transcurso de 1 a 2 das hbiles.  Para renovar recetas, por favor pida a su farmacia que se ponga en contacto con nuestra oficina. Franz Jacks de fax es [place] [phone].  Si tiene un asunto urgente cuando la clnica est cerrada y que no puede esperar hasta el siguiente da hbil, puede llamar/localizar a su doctor(a) al nmero que aparece a continuacin.   Por favor, tenga en cuenta que aunque hacemos todo lo posible para estar disponibles para asuntos urgentes fuera del horario de [place], no estamos disponibles las 24 horas del da, los 7 [address]  [address] de la [place].   Si tiene un problema urgente y no puede comunicarse con nosotros, puede optar por buscar atencin mdica  en el consultorio de su doctor(a), en una clnica privada, en un centro de atencin urgente o en una sala de emergencias.  Si tiene Engineer, drilling, por favor llame inmediatamente al 911 o vaya a la sala de emergencias.  Nmeros de bper  - Dr. Bary Likes: [phone]  - Dra. Annette Barters: [phone]  - Dr. Felipe Horton: [phone]   En caso de inclemencias del tiempo, por favor llame a Lajuan Pila principal al [phone] para una actualizacin sobre el [place] de cualquier retraso o cierre.  Consejos para la medicacin en dermatologa: Por favor, guarde las cajas en las que vienen los  medicamentos de uso tpico para ayudarle a seguir las instrucciones sobre dnde y cmo usarlos. Las farmacias generalmente imprimen las instrucciones del medicamento slo en las cajas y no directamente en los tubos del [place].   Si su medicamento es muy caro, por favor, pngase en contacto con Bettyjane Brunet llamando al [phone] y presione la opcin 4 o envenos un mensaje a travs de Clinical cytogeneticist.   No podemos decirle cul ser su copago por los medicamentos por adelantado ya que esto es diferente dependiendo de la cobertura de su seguro. Sin embargo, es posible que podamos encontrar un medicamento sustituto a Audiological scientist un formulario para que el seguro cubra el medicamento que se considera necesario.   Si se requiere una autorizacin previa para que su compaa de seguros [place] su medicamento, por favor permtanos de 1 a 2 das hbiles para completar este proceso.  Los precios de los medicamentos varan con frecuencia dependiendo del Environmental consultant de dnde se surte la receta y alguna farmacias pueden ofrecer precios ms baratos.  El sitio web www.goodrx.com tiene cupones para medicamentos de  diferentes farmacias. Los precios aqu no tienen en cuenta lo que podra costar con la ayuda del seguro (puede ser ms barato con su seguro), pero el sitio web puede darle el precio si no utiliz Tourist information centre manager.  - Puede imprimir el cupn correspondiente y llevarlo con su receta a la farmacia.  - Tambin puede pasar por nuestra oficina durante el horario de atencin regular y Education officer, museum una tarjeta de cupones de GoodRx.  - Si necesita que su receta se enve electrnicamente a una farmacia diferente, informe a nuestra oficina a travs de MyChart de [place] o por telfono llamando al [phone] y presione la opcin 4.

## 2024-03-25 ENCOUNTER — Encounter: Payer: Self-pay | Admitting: Dermatology

## 2024-04-04 DIAGNOSIS — H60333 Swimmer's ear, bilateral: Secondary | ICD-10-CM | POA: Diagnosis not present

## 2024-05-07 ENCOUNTER — Encounter: Payer: Self-pay | Admitting: Dermatology

## 2024-05-09 ENCOUNTER — Other Ambulatory Visit: Payer: Self-pay | Admitting: Dermatology

## 2024-05-09 DIAGNOSIS — Z79899 Other long term (current) drug therapy: Secondary | ICD-10-CM

## 2024-05-14 DIAGNOSIS — Z79899 Other long term (current) drug therapy: Secondary | ICD-10-CM | POA: Diagnosis not present

## 2024-05-15 ENCOUNTER — Ambulatory Visit: Payer: Self-pay | Admitting: Dermatology

## 2024-05-15 LAB — CBC WITH DIFFERENTIAL/PLATELET
Basophils Absolute: 0.1 x10E3/uL (ref 0.0–0.2)
Basos: 2 %
EOS (ABSOLUTE): 0.3 x10E3/uL (ref 0.0–0.4)
Eos: 5 %
Hematocrit: 38.4 % (ref 34.0–46.6)
Hemoglobin: 12.7 g/dL (ref 11.1–15.9)
Immature Grans (Abs): 0 x10E3/uL (ref 0.0–0.1)
Immature Granulocytes: 0 %
Lymphocytes Absolute: 1.3 x10E3/uL (ref 0.7–3.1)
Lymphs: 23 %
MCH: 31 pg (ref 26.6–33.0)
MCHC: 33.1 g/dL (ref 31.5–35.7)
MCV: 94 fL (ref 79–97)
Monocytes Absolute: 0.5 x10E3/uL (ref 0.1–0.9)
Monocytes: 9 %
Neutrophils Absolute: 3.6 x10E3/uL (ref 1.4–7.0)
Neutrophils: 61 %
Platelets: 360 x10E3/uL (ref 150–450)
RBC: 4.1 x10E6/uL (ref 3.77–5.28)
RDW: 14.1 % (ref 11.7–15.4)
WBC: 5.8 x10E3/uL (ref 3.4–10.8)

## 2024-05-15 LAB — COMPREHENSIVE METABOLIC PANEL WITH GFR
ALT: 14 IU/L (ref 0–32)
AST: 16 IU/L (ref 0–40)
Albumin: 4.2 g/dL (ref 3.9–4.9)
Alkaline Phosphatase: 92 IU/L (ref 44–121)
BUN/Creatinine Ratio: 28 (ref 12–28)
BUN: 17 mg/dL (ref 8–27)
Bilirubin Total: <0.2 mg/dL (ref 0.0–1.2)
CO2: 20 mmol/L (ref 20–29)
Calcium: 9.2 mg/dL (ref 8.7–10.3)
Chloride: 99 mmol/L (ref 96–106)
Creatinine, Ser: 0.6 mg/dL (ref 0.57–1.00)
Globulin, Total: 2.8 g/dL (ref 1.5–4.5)
Glucose: 103 mg/dL — ABNORMAL HIGH (ref 70–99)
Potassium: 4.6 mmol/L (ref 3.5–5.2)
Sodium: 136 mmol/L (ref 134–144)
Total Protein: 7 g/dL (ref 6.0–8.5)
eGFR: 101 mL/min/1.73 (ref >59)

## 2024-06-14 ENCOUNTER — Encounter: Payer: Self-pay | Admitting: Dermatology

## 2024-06-14 DIAGNOSIS — Z79899 Other long term (current) drug therapy: Secondary | ICD-10-CM

## 2024-06-14 DIAGNOSIS — L439 Lichen planus, unspecified: Secondary | ICD-10-CM

## 2024-06-17 MED ORDER — METHOTREXATE SODIUM 2.5 MG PO TABS: ORAL_TABLET | ORAL | 0 refills | Status: DC

## 2024-06-27 ENCOUNTER — Ambulatory Visit: Admitting: Dermatology

## 2024-06-27 ENCOUNTER — Encounter: Payer: Self-pay | Admitting: Dermatology

## 2024-06-27 DIAGNOSIS — Z7189 Other specified counseling: Secondary | ICD-10-CM | POA: Diagnosis not present

## 2024-06-27 DIAGNOSIS — L439 Lichen planus, unspecified: Secondary | ICD-10-CM

## 2024-06-27 DIAGNOSIS — K0510 Chronic gingivitis, plaque induced: Secondary | ICD-10-CM | POA: Diagnosis not present

## 2024-06-27 DIAGNOSIS — Z79899 Other long term (current) drug therapy: Secondary | ICD-10-CM

## 2024-06-27 MED ORDER — METHOTREXATE SODIUM 2.5 MG PO TABS
20.0000 mg | ORAL_TABLET | ORAL | 2 refills | Status: DC
Start: 2024-06-27 — End: 2024-08-07

## 2024-06-27 MED ORDER — FOLIC ACID 1 MG PO TABS: 2.0000 mg | ORAL_TABLET | Freq: Every day | ORAL | 2 refills | Status: AC

## 2024-06-27 NOTE — Patient Instructions (Signed)
 Continue methotrexate  increasing to 20 mg once weekly + 2 mg folic acid  on other days.   Patient will send us  a message in 1 month to have labs drawn.   Due to recent changes in healthcare laws, you may see results of your pathology and/or laboratory studies on MyChart before the doctors have had a chance to review them. We understand that in some cases there may be results that are confusing or concerning to you. Please understand that not all results are received at the same time and often the doctors may need to interpret multiple results in order to provide you with the best plan of care or course of treatment. Therefore, we ask that you please give us  2 business days to thoroughly review all your results before contacting the office for clarification. Should we see a critical lab result, you will be contacted sooner.   If You Need Anything After Your Visit  If you have any questions or concerns for your doctor, please call our main line at [phone] and press option 4 to reach your doctor's medical assistant. If no one answers, please leave a voicemail as directed and we will return your call as soon as possible. Messages left after 4 pm will be answered the following business day.   You may also send us  a message via MyChart. We typically respond to MyChart messages within 1-2 business days.  For prescription refills, please ask your pharmacy to contact our office. Our fax number is [phone].  If you have an urgent issue when the clinic is closed that cannot wait until the next business day, you can page your doctor at the number below.    Please note that while we do our best to be available for urgent issues outside of office hours, we are not available 24/7.   If you have an urgent issue and are unable to reach us , you may choose to seek medical care at your doctor's office, retail clinic, urgent care center, or emergency room.  If you have a medical emergency, please immediately  call 911 or go to the emergency department.  Pager Numbers  - Dr. Hester: [phone]  - Dr. Jackquline: [phone]  - Dr. Claudene: [phone]   - Dr. Raymund: [phone]  In the event of inclement weather, please call our main line at [phone] for an update on the status of any delays or closures.  Dermatology Medication Tips: Please keep the boxes that topical medications come in in order to help keep track of the instructions about where and how to use these. Pharmacies typically print the medication instructions only on the boxes and not directly on the medication tubes.   If your medication is too expensive, please contact our office at [phone] option 4 or send us  a message through MyChart.   We are unable to tell what your co-pay for medications will be in advance as this is different depending on your insurance coverage. However, we may be able to find a substitute medication at lower cost or fill out paperwork to get insurance to cover a needed medication.   If a prior authorization is required to get your medication covered by your insurance company, please allow us  1-2 business days to complete this process.  Drug prices often vary depending on where the prescription is filled and some pharmacies may offer cheaper prices.  The website www.goodrx.com contains coupons for medications through different pharmacies. The prices here do not account for what the cost may  be with help from insurance (it may be cheaper with your insurance), but the website can give you the price if you did not use any insurance.  - You can print the associated coupon and take it with your prescription to the pharmacy.  - You may also stop by our office during regular business hours and pick up a GoodRx coupon card.  - If you need your prescription sent electronically to a different pharmacy, notify our office through [facility] or by phone at [phone] option 4.     Si Usted  Necesita Algo Despus de Su Visita  Tambin puede enviarnos un mensaje a travs de Clinical cytogeneticist. Por lo general respondemos a los mensajes de MyChart en el transcurso de 1 a 2 das hbiles.  Para renovar recetas, por favor pida a su farmacia que se ponga en contacto con nuestra oficina. Randi lakes de fax es [place] [phone].  Si tiene un asunto urgente cuando la clnica est cerrada y que no puede esperar hasta el siguiente da hbil, puede llamar/localizar a su doctor(a) al nmero que aparece a continuacin.   Por favor, tenga en cuenta que aunque hacemos todo lo posible para estar disponibles para asuntos urgentes fuera del horario de [place], no estamos disponibles las 24 horas del da, los 7 [address]  [address] de la [place].   Si tiene un problema urgente y no puede comunicarse con nosotros, puede optar por buscar atencin mdica  en el consultorio de su doctor(a), en una clnica privada, en un centro de atencin urgente o en una sala de emergencias.  Si tiene Engineer, drilling, por favor llame inmediatamente al 911 o vaya a la sala de emergencias.  Nmeros de bper  - Dr. Hester: [phone]  - Dra. Jackquline: [phone]  - Dr. Claudene: [phone]  - Dra. Kitts: [phone]  En caso de inclemencias del [place], por favor llame a nuestra lnea principal al [phone] para una actualizacin sobre el estado de cualquier retraso o cierre.  Consejos para la medicacin en dermatologa: Por favor, guarde las cajas en las que vienen los medicamentos de uso tpico para ayudarle a seguir las instrucciones sobre dnde y cmo usarlos. Las farmacias generalmente imprimen las instrucciones del medicamento slo en las cajas y no directamente en los tubos del [place].   Si su medicamento es muy caro, por favor, pngase en contacto con landry rieger llamando al [phone] y presione la opcin 4 o envenos un mensaje a travs de Clinical cytogeneticist.   No podemos decirle cul ser su copago por los medicamentos  por adelantado ya que esto es diferente dependiendo de la cobertura de su seguro. Sin embargo, es posible que podamos encontrar un medicamento sustituto a Audiological scientist un formulario para que el seguro cubra el medicamento que se considera necesario.   Si se requiere una autorizacin previa para que su compaa de seguros [place]ta su medicamento, por favor permtanos de 1 a 2 das hbiles para completar este proceso.  Los precios de los medicamentos varan con frecuencia dependiendo del Environmental consultant de dnde se surte la receta y alguna farmacias pueden ofrecer precios ms baratos.  El sitio web www.goodrx.com tiene cupones para medicamentos de Health and safety inspector. Los precios aqu no tienen en cuenta lo que podra costar con la ayuda del seguro (puede ser ms barato con su seguro), pero el sitio web puede darle el precio si no utiliz Tourist information centre manager.  - Puede imprimir el cupn correspondiente y llevarlo con su receta a la farmacia.  - Tambin  puede pasar por nuestra oficina durante el horario de atencin regular y Education officer, museum una tarjeta de cupones de GoodRx.  - Si necesita que su receta se enve electrnicamente a una farmacia diferente, informe a nuestra oficina a travs de MyChart de [place] o por telfono llamando al [phone] y presione la opcin 4.

## 2024-06-27 NOTE — Progress Notes (Signed)
   Follow-Up Visit   Subjective  Christie Mcdonald is a 63 y.o. female who presents for the following: 3 month follow up for chronic desquamative gingivitis. Patient taking methotrexate  15 mg once weekly. Went last week for dental cleaning and patient had excessive bleeding. No gum disease. She has noticed more redness at upper gums which was improved at last visit. Bottom gums are still active. Patient has also noticed increased bleeding when having blood drawn.   03/12/24 - Patient saw dentist for cleaning, upper gums were improved per dentist. 03/13/24 - Saw PCP for ear infection and was put on antibiotic and 3 days of prednisone . Discontinued methotrexate  for 5 weeks. Ear was not getting better so pt went to ENT on 03/21/24 after seeing Dr. Claudene and was given drops to use for ears. 04/17/24 - restarted methotrexate   The following portions of the chart were reviewed this encounter and updated as appropriate: medications, allergies, medical history  Review of Systems:  No other skin or systemic complaints except as noted in HPI or Assessment and Plan.  Objective  Well appearing patient in no apparent distress; mood and affect are within normal limits.   A focused examination was performed of the following areas: mouth  Relevant exam findings are noted in the Assessment and Plan.    Assessment & Plan   CHRONIC DESQUAMATIVE GINGIVITIS, flaring despite 2 months of 15 mg weekly MTX  Clinical diagnosis: erosive gingivitis. 03/22/23 oral biopsy confirms clinical diagnosis but is nonspecific. Ddx includes lichen planus (most likely given multiple past skin biopsies with lichen planus) vs mucous membrane pemphigoid   Exam: diffuse eroded patches on anterior maxillary and mandibular facial gingiva along gingival rim and further from teeth. no rashes, no sores in genital are per patient.  05/14/24 CBC diff CMP normal  Treatment Plan: Continue methotrexate  increasing to 20 mg once weekly + 2 mg  folic acid  on other days.   Repeat labs in 1 month given dose increase LICHEN PLANUS   Related Medications folic acid  (FOLVITE ) 1 MG tablet Take 2 tablets (2 mg total) by mouth daily. Except on methotrexate  day methotrexate  (RHEUMATREX) 2.5 MG tablet Take 8 tablets (20 mg total) by mouth once a week. Caution:Chemotherapy. Protect from light.TAKE 8 TABLETS BY MOUTH ONE TIME PER WEEK LONG-TERM USE OF HIGH-RISK MEDICATION   Related Medications folic acid  (FOLVITE ) 1 MG tablet Take 2 tablets (2 mg total) by mouth daily. Except on methotrexate  day methotrexate  (RHEUMATREX) 2.5 MG tablet Take 8 tablets (20 mg total) by mouth once a week. Caution:Chemotherapy. Protect from light.TAKE 8 TABLETS BY MOUTH ONE TIME PER WEEK  Return in about 3 months (around 09/27/2024) for with Dr. Claudene.  LILLETTE Lonell Drones, RMA, am acting as scribe for Boneta Claudene, MD .   Documentation: I have reviewed the above documentation for accuracy and completeness, and I agree with the above.  Boneta Claudene, MD

## 2024-07-24 ENCOUNTER — Encounter: Payer: Self-pay | Admitting: Dermatology

## 2024-07-25 ENCOUNTER — Other Ambulatory Visit: Payer: Self-pay

## 2024-07-25 DIAGNOSIS — K0510 Chronic gingivitis, plaque induced: Secondary | ICD-10-CM

## 2024-07-25 DIAGNOSIS — Z79899 Other long term (current) drug therapy: Secondary | ICD-10-CM

## 2024-07-25 DIAGNOSIS — L439 Lichen planus, unspecified: Secondary | ICD-10-CM

## 2024-07-29 DIAGNOSIS — L439 Lichen planus, unspecified: Secondary | ICD-10-CM | POA: Diagnosis not present

## 2024-07-29 DIAGNOSIS — Z79899 Other long term (current) drug therapy: Secondary | ICD-10-CM | POA: Diagnosis not present

## 2024-07-29 DIAGNOSIS — K0510 Chronic gingivitis, plaque induced: Secondary | ICD-10-CM | POA: Diagnosis not present

## 2024-07-30 ENCOUNTER — Ambulatory Visit: Payer: Self-pay | Admitting: Dermatology

## 2024-07-30 LAB — COMPREHENSIVE METABOLIC PANEL WITH GFR
ALT: 10 IU/L (ref 0–32)
AST: 13 IU/L (ref 0–40)
Albumin: 4.2 g/dL (ref 3.9–4.9)
Alkaline Phosphatase: 106 IU/L (ref 49–135)
BUN/Creatinine Ratio: 16 (ref 12–28)
BUN: 10 mg/dL (ref 8–27)
Bilirubin Total: 0.2 mg/dL (ref 0.0–1.2)
CO2: 24 mmol/L (ref 20–29)
Calcium: 9 mg/dL (ref 8.7–10.3)
Chloride: 97 mmol/L (ref 96–106)
Creatinine, Ser: 0.63 mg/dL (ref 0.57–1.00)
Globulin, Total: 2.6 g/dL (ref 1.5–4.5)
Glucose: 101 mg/dL — ABNORMAL HIGH (ref 70–99)
Potassium: 4.4 mmol/L (ref 3.5–5.2)
Sodium: 136 mmol/L (ref 134–144)
Total Protein: 6.8 g/dL (ref 6.0–8.5)
eGFR: 99 mL/min/1.73 (ref >59)

## 2024-07-30 LAB — CBC WITH DIFFERENTIAL/PLATELET
Basophils Absolute: 0.1 x10E3/uL (ref 0.0–0.2)
Basos: 1 %
EOS (ABSOLUTE): 0.3 x10E3/uL (ref 0.0–0.4)
Eos: 4 %
Hematocrit: 37.8 % (ref 34.0–46.6)
Hemoglobin: 12.2 g/dL (ref 11.1–15.9)
Immature Grans (Abs): 0 x10E3/uL (ref 0.0–0.1)
Immature Granulocytes: 0 %
Lymphocytes Absolute: 1.2 x10E3/uL (ref 0.7–3.1)
Lymphs: 19 %
MCH: 30.8 pg (ref 26.6–33.0)
MCHC: 32.3 g/dL (ref 31.5–35.7)
MCV: 96 fL (ref 79–97)
Monocytes Absolute: 0.5 x10E3/uL (ref 0.1–0.9)
Monocytes: 7 %
Neutrophils Absolute: 4.5 x10E3/uL (ref 1.4–7.0)
Neutrophils: 69 %
Platelets: 369 x10E3/uL (ref 150–450)
RBC: 3.96 x10E6/uL (ref 3.77–5.28)
RDW: 14.4 % (ref 11.7–15.4)
WBC: 6.6 x10E3/uL (ref 3.4–10.8)

## 2024-08-04 ENCOUNTER — Other Ambulatory Visit: Payer: Self-pay | Admitting: Family Medicine

## 2024-08-04 DIAGNOSIS — J302 Other seasonal allergic rhinitis: Secondary | ICD-10-CM

## 2024-08-06 ENCOUNTER — Other Ambulatory Visit: Payer: Self-pay | Admitting: Dermatology

## 2024-08-06 DIAGNOSIS — L439 Lichen planus, unspecified: Secondary | ICD-10-CM

## 2024-08-06 DIAGNOSIS — Z79899 Other long term (current) drug therapy: Secondary | ICD-10-CM

## 2024-08-07 ENCOUNTER — Other Ambulatory Visit: Payer: Self-pay | Admitting: Dermatology

## 2024-08-07 DIAGNOSIS — Z79899 Other long term (current) drug therapy: Secondary | ICD-10-CM

## 2024-08-07 MED ORDER — FOLIC ACID 1 MG PO TABS: 2.0000 mg | ORAL_TABLET | Freq: Every day | ORAL | 1 refills | Status: DC

## 2024-08-12 ENCOUNTER — Other Ambulatory Visit: Payer: Self-pay | Admitting: Family Medicine

## 2024-08-12 DIAGNOSIS — I10 Essential (primary) hypertension: Secondary | ICD-10-CM

## 2024-08-15 ENCOUNTER — Ambulatory Visit: Admitting: Family Medicine

## 2024-08-22 ENCOUNTER — Ambulatory Visit: Admitting: Family Medicine

## 2024-08-22 ENCOUNTER — Encounter: Payer: Self-pay | Admitting: Family Medicine

## 2024-08-22 VITALS — BP 126/80 | HR 82 | Resp 16 | Ht 61.0 in | Wt 177.2 lb

## 2024-08-22 DIAGNOSIS — E559 Vitamin D deficiency, unspecified: Secondary | ICD-10-CM

## 2024-08-22 DIAGNOSIS — R29818 Other symptoms and signs involving the nervous system: Secondary | ICD-10-CM

## 2024-08-22 DIAGNOSIS — R739 Hyperglycemia, unspecified: Secondary | ICD-10-CM

## 2024-08-22 DIAGNOSIS — L439 Lichen planus, unspecified: Secondary | ICD-10-CM | POA: Diagnosis not present

## 2024-08-22 DIAGNOSIS — J3089 Other allergic rhinitis: Secondary | ICD-10-CM

## 2024-08-22 DIAGNOSIS — R7303 Prediabetes: Secondary | ICD-10-CM | POA: Diagnosis not present

## 2024-08-22 DIAGNOSIS — Z23 Encounter for immunization: Secondary | ICD-10-CM

## 2024-08-22 DIAGNOSIS — I10 Essential (primary) hypertension: Secondary | ICD-10-CM

## 2024-08-22 DIAGNOSIS — D84821 Immunodeficiency due to drugs: Secondary | ICD-10-CM

## 2024-08-22 DIAGNOSIS — J302 Other seasonal allergic rhinitis: Secondary | ICD-10-CM

## 2024-08-22 DIAGNOSIS — E785 Hyperlipidemia, unspecified: Secondary | ICD-10-CM

## 2024-08-22 MED ORDER — AMLODIPINE BESYLATE-VALSARTAN 5-160 MG PO TABS: 1.0000 | ORAL_TABLET | Freq: Every day | ORAL | 1 refills | Status: AC

## 2024-08-22 MED ORDER — LEVOCETIRIZINE DIHYDROCHLORIDE 5 MG PO TABS: 5.0000 mg | ORAL_TABLET | Freq: Every evening | ORAL | 1 refills | Status: AC

## 2024-08-22 MED ORDER — ROSUVASTATIN CALCIUM 5 MG PO TABS: 5.0000 mg | ORAL_TABLET | Freq: Every day | ORAL | 1 refills | Status: AC

## 2024-08-22 MED ORDER — AZELASTINE HCL 0.1 % NA SOLN: 2.0000 | Freq: Two times a day (BID) | NASAL | 1 refills | Status: AC

## 2024-08-22 MED ORDER — ATENOLOL 25 MG PO TABS: 12.5000 mg | ORAL_TABLET | Freq: Every day | ORAL | 1 refills | Status: AC

## 2024-08-22 NOTE — Progress Notes (Signed)
 Name: ROSLIN Mcdonald   MRN: [id number]    DOB: 05-29-1961   Date:08/22/2024       Progress Note  Subjective  Chief Complaint  Chief Complaint  Patient presents with   Medical Management of Chronic Issues   Discussed the use of AI scribe software for clinical note transcription with the patient, who gave verbal consent to proceed.  History of Present Illness Christie Mcdonald is a 63 year old female who presents for a regular follow-up visit.  She has been experiencing postnasal drip and coughing for the past two weeks, which worsened during a recent trip to the beach. The drainage is thick and clear. No fever or chills. Using a humidifier at night has helped reduce the coughing, and she has been using a neti pot for relief. She has a history of allergies and has been using Flonase . She previously took loratadine but stopped due to concerns about blood pressure. She has Zyrtec available but is cautious about its use as she ages.  Regarding her prediabetes, her A1c was 6.5% in October of last year, which decreased to 6.4% after dietary changes. She is mindful of her diet, especially during the holiday season, and has switched to unsweetened tea at home.  She experiences hip pain after walking for about 15 minutes, located in the groin and thigh area. She has not had an x-ray of the hip. She is trying stretches and wonders if the pain is related to prolonged sitting at her job.  She is on methotrexate  for lichen planus and reports that the dosage was recently increased. She believes she is starting to notice some improvement. She takes folic acid  alongside methotrexate .  Her blood pressure is well-controlled with atenolol  and Exforge  (amlodipine , valsartan ). She takes atenolol  at night and Exforge  as prescribed. No chest pain or palpitations.  She is on rosuvastatin  for dyslipidemia and reports no side effects.  She has a history of major depression and anxiety, which were exacerbated by  significant life events, including the death of her father and her husband's job loss. She and her husband are now retired and adjusting to this new phase of life.    Patient Active Problem List   Diagnosis Date Noted   Chronic desquamative gingivitis 11/27/2023   Lichen sclerosus 02/21/2023   Depression, major, in remission 02/21/2023   B12 deficiency 02/21/2023   Tension headache 02/21/2023   Systolic ejection murmur 01/16/2018   Gastro-esophageal reflux disease without esophagitis 09/20/2016   Menopause 09/20/2016   Temporomandibular joint-pain-dysfunction syndrome 09/20/2016   Hyperglycemia 09/20/2016   Vitamin D  deficiency 12/22/2015   Essential hypertension 04/23/2015   Dyslipidemia (high LDL; low HDL) 04/23/2015    Past Surgical History:  Procedure Laterality Date   ABDOMINAL HYSTERECTOMY  1991   BREAST BIOPSY Left 1997   negative   COLONOSCOPY WITH PROPOFOL  N/A 06/10/2015   Procedure: COLONOSCOPY WITH PROPOFOL ;  Surgeon: Louanne KANDICE Muse, MD;  Location: [facility] ENDOSCOPY;  Service: Endoscopy;  Laterality: N/A;   HEMORRHOID SURGERY  1988   Dr Evalene Moats    Family History  Problem Relation Age of Onset   Diabetes Mother    Alzheimer's disease Mother    Heart disease Mother        CHF   Stroke Mother    Dementia Mother    Anxiety disorder Mother    Depression Mother    Hypertension Mother    Stroke Father    Kidney disease Father    Parkinson's disease Father  Hypertension Father    Hypertension Sister    Diabetes Sister    Depression Sister    Diabetes Maternal Grandmother    Breast cancer Paternal Grandfather    Cancer Paternal Grandfather        Breast   Breast cancer Cousin        maternal-twice    Social History   Tobacco Use   Smoking status: Former    Current packs/day: 0.00    Types: Cigarettes    Quit date: 06/26/2014    Years since quitting: 10.1   Smokeless tobacco: Never  Substance Use Topics   Alcohol use: No    Alcohol/week:  0.0 standard drinks of alcohol     Current Outpatient Medications:    azelastine (ASTELIN) 0.1 % nasal spray, Place 2 sprays into both nostrils 2 (two) times daily. Use in each nostril as directed, Disp: 90 mL, Rfl: 1   Baclofen  5 MG TABS, Take 1 tablet (5 mg total) by mouth at bedtime as needed., Disp: 90 tablet, Rfl: 1   chlorhexidine (PERIDEX) 0.12 % solution, 2 (two) times daily., Disp: , Rfl:    Cholecalciferol (VITAMIN D3) 50 MCG (2000 UT) capsule, Take 1 capsule (2,000 Units total) by mouth daily., Disp: 100 capsule, Rfl: 0   fluocinonide gel (LIDEX) 0.05 %, SMARTSIG:Sparingly Topical 2-3 Times Daily, Disp: , Rfl:    fluticasone  (FLONASE ) 50 MCG/ACT nasal spray, SPRAY 2 SPRAYS INTO EACH NOSTRIL EVERY DAY, Disp: 16 mL, Rfl: 2   folic acid  (FOLVITE ) 1 MG tablet, Take 2 tablets (2 mg total) by mouth daily. Take every day except the day you take methotrexate , Disp: 180 tablet, Rfl: 1   hydrocortisone (ANUSOL-HC) 25 MG suppository, Place 25 mg rectally 2 (two) times daily., Disp: , Rfl:    levocetirizine (XYZAL) 5 MG tablet, Take 1 tablet (5 mg total) by mouth every evening., Disp: 90 tablet, Rfl: 1   methotrexate  (RHEUMATREX) 2.5 MG tablet, TAKE 8 TABLETS (20 MG TOTAL) BY MOUTH ONCE A WEEK. CAUTION: CHEMOTHERAPY. PROTECT FROM LIGHT., Disp: 96 tablet, Rfl: 1   psyllium (METAMUCIL) 58.6 % packet, Take 1 packet by mouth daily., Disp: , Rfl:    triamcinolone ointment (KENALOG) 0.1 %, APPLY ON AFFECTED AREA TWICE DAILY AS NEEDED., Disp: , Rfl:    amLODipine -valsartan  (EXFORGE ) 5-160 MG tablet, Take 1 tablet by mouth daily., Disp: 90 tablet, Rfl: 1   atenolol  (TENORMIN ) 25 MG tablet, Take 0.5 tablets (12.5 mg total) by mouth daily., Disp: 45 tablet, Rfl: 1   Cyanocobalamin  (B-12 SL), Place 1 drop under the tongue daily., Disp: , Rfl:    rosuvastatin  (CRESTOR ) 5 MG tablet, Take 1 tablet (5 mg total) by mouth daily., Disp: 90 tablet, Rfl: 1  Allergies  Allergen Reactions   Hydrochlorothiazide  Other (See Comments)   Lisinopril Cough    I personally reviewed active problem list, medication list, allergies, family history with the patient/caregiver today.   ROS  Ten systems reviewed and is negative except as mentioned in HPI    Objective Physical Exam VITALS: BP- 126/80 CONSTITUTIONAL: Patient appears well-developed and well-nourished.  No distress. HEENT: Head atraumatic, normocephalic, neck supple. CARDIOVASCULAR: Normal rate, regular rhythm and normal heart sounds.  No murmur heard. No BLE edema. PULMONARY: Effort normal and breath sounds normal. No respiratory distress. ABDOMINAL: There is no tenderness or distention. MUSCULOSKELETAL: Normal gait. Without gross motor or sensory deficit. PSYCHIATRIC: Patient has a normal mood and affect. behavior is normal. Judgment and thought content normal.  Vitals:  08/22/24 1059  BP: 126/80  Pulse: 82  Resp: 16  SpO2: 97%  Weight: 177 lb 3.2 oz (80.4 kg)  Height: 5' 1 (1.549 m)    Body mass index is 33.48 kg/m.  Recent Results (from the past 2160 hours)  Comprehensive metabolic panel with GFR     Status: Abnormal   Collection Time: 07/29/24 10:41 AM  Result Value Ref Range   Glucose 101 (H) 70 - 99 mg/dL   BUN 10 8 - 27 mg/dL   Creatinine, Ser 9.36 0.57 - 1.00 mg/dL   eGFR 99 >40 fO/fpw/8.26   BUN/Creatinine Ratio 16 12 - 28   Sodium 136 134 - 144 mmol/L   Potassium 4.4 3.5 - 5.2 mmol/L   Chloride 97 96 - 106 mmol/L   CO2 24 20 - 29 mmol/L   Calcium  9.0 8.7 - 10.3 mg/dL   Total Protein 6.8 6.0 - 8.5 g/dL   Albumin 4.2 3.9 - 4.9 g/dL   Globulin, Total 2.6 1.5 - 4.5 g/dL   Bilirubin Total 0.2 0.0 - 1.2 mg/dL   Alkaline Phosphatase 106 49 - 135 IU/L    Comment:               **Please note reference interval change**   AST 13 0 - 40 IU/L   ALT 10 0 - 32 IU/L  CBC with Differential/Platelets     Status: None   Collection Time: 07/29/24 10:41 AM  Result Value Ref Range   WBC 6.6 3.4 - 10.8 x10E3/uL   RBC 3.96  3.77 - 5.28 x10E6/uL   Hemoglobin 12.2 11.1 - 15.9 g/dL   Hematocrit 62.1 65.9 - 46.6 %   MCV 96 79 - 97 fL   MCH 30.8 26.6 - 33.0 pg   MCHC 32.3 31.5 - 35.7 g/dL   RDW 85.5 88.2 - 84.5 %   Platelets 369 150 - 450 x10E3/uL   Neutrophils 69 Not Estab. %   Lymphs 19 Not Estab. %   Monocytes 7 Not Estab. %   Eos 4 Not Estab. %   Basos 1 Not Estab. %   Neutrophils Absolute 4.5 1.4 - 7.0 x10E3/uL   Lymphocytes Absolute 1.2 0.7 - 3.1 x10E3/uL   Monocytes Absolute 0.5 0.1 - 0.9 x10E3/uL   EOS (ABSOLUTE) 0.3 0.0 - 0.4 x10E3/uL   Basophils Absolute 0.1 0.0 - 0.2 x10E3/uL   Immature Granulocytes 0 Not Estab. %   Immature Grans (Abs) 0.0 0.0 - 0.1 x10E3/uL    Diabetic Foot Exam:     PHQ2/9:    08/22/2024   10:50 AM 03/13/2024   10:11 AM 02/14/2024   10:21 AM 08/23/2023   10:27 AM 02/21/2023    3:09 PM  Depression screen PHQ 2/9  Decreased Interest 0 0 0 0 0  Down, Depressed, Hopeless 0 0 0 0 0  PHQ - 2 Score 0 0 0 0 0  Altered sleeping 0  0 0 0  Tired, decreased energy 0  0 0 0  Change in appetite 0  0 0 0  Feeling bad or failure about yourself  0  0 0 0  Trouble concentrating 0  0 0 0  Moving slowly or fidgety/restless 0  0 0 0  Suicidal thoughts 0  0 0 0  PHQ-9 Score 0  0 0 0  Difficult doing work/chores Not difficult at all  Not difficult at all      phq 9 is negative  Fall Risk:    08/22/2024  10:50 AM 03/13/2024   10:07 AM 08/23/2023   10:27 AM 02/21/2023    3:09 PM 11/11/2022   10:56 AM  Fall Risk   Falls in the past year? 0 0 0 0 0  Number falls in past yr: 0 0  0 0  Injury with Fall? 0 0  0 0  Risk for fall due to : No Fall Risks No Fall Risks No Fall Risks No Fall Risks   Follow up Falls evaluation completed Falls prevention discussed;Education provided;Falls evaluation completed Falls prevention discussed Falls prevention discussed      Assessment & Plan Pre-diabetes A1c was 6.5% in October last year, indicating diabetes range, and 6.4% six months later. She  is not diagnosed with diabetes but is close to becoming diabetic. - Check A1c annually. - Maintain conscientious diet, especially during holiday seasons.  Perennial allergic rhinitis with seasonal variation Experiencing postnasal drip and cough for two weeks without fever or chills. Humidifier use has reduced coughing. Loratadine may increase blood pressure, so advised against its use. Flonase  and neti pot are being used. Zyrtec is considered safe, and Xyzal (levocetirizine) is recommended as an alternative. Astelin nasal spray suggested for severe symptoms. - Continue using Flonase . - Prescribe Xyzal (levocetirizine). - Prescribe Astelin nasal spray for severe symptoms. - Use saline or neti pot for nasal irrigation.  Hypertension Blood pressure is 126/80 mmHg, well-controlled with atenolol  and Exforge  (amlodipine /valsartan ). - Continue atenolol  and Exforge . - Provide six-month supply of medications.  Dyslipidemia Cholesterol levels are managed with rosuvastatin  5 mg without side effects. - Continue rosuvastatin  5 mg.  Lichen planus with oral involvement on methotrexate  immunosuppression Being treated with methotrexate , which has been increased. She reports starting to notice some benefit. Methotrexate  is not expected to cause joint pain. Folic acid  is also being taken. Dermatologist follow-up scheduled. - Continue methotrexate  and folic acid . - Follow up with dermatologist.  Neurogenic claudication with right hip and bilateral thigh pain under evaluation Pain in the groin and thigh, possibly related to spine or hip. Discussed differential diagnosis including spinal stenosis and hip-related issues. - Consider x-ray of the back. - Consider referral to orthopedic specialist for evaluation and imaging.  Tension headaches and muscle spasms Muscle spasms and tension headaches have improved. Baclofen  is used as needed. Stress from previous job and legal issues may have contributed to  symptoms. - Use baclofen  as needed. - Consider Tylenol for tension headaches.  Major Depression in remission Depression is in remission. She has experienced significant life changes and stressors, including retirement and family losses, but is managing well.  General Health Maintenance Discussed vaccinations and screenings. Hepatitis B vaccine recommended due to methotrexate  use. Tetanus shot is due before February. Shingles vaccine suggested after turning 64. Pneumonia vaccine recommended at age 1. Flu shot declined. - Administer  Tdap today  - Consider hepatitis B vaccine after consulting dermatologist. - Plan for shingles vaccine after turning 64. - Plan for pneumonia vaccine at age 32.

## 2024-09-16 ENCOUNTER — Other Ambulatory Visit: Payer: Self-pay | Admitting: Family Medicine

## 2024-09-16 DIAGNOSIS — Z1231 Encounter for screening mammogram for malignant neoplasm of breast: Secondary | ICD-10-CM

## 2024-09-26 ENCOUNTER — Encounter: Payer: Self-pay | Admitting: Dermatology

## 2024-09-26 ENCOUNTER — Ambulatory Visit: Admitting: Dermatology

## 2024-09-26 DIAGNOSIS — L439 Lichen planus, unspecified: Secondary | ICD-10-CM

## 2024-09-26 DIAGNOSIS — Z7189 Other specified counseling: Secondary | ICD-10-CM

## 2024-09-26 DIAGNOSIS — Z79899 Other long term (current) drug therapy: Secondary | ICD-10-CM | POA: Diagnosis not present

## 2024-09-26 DIAGNOSIS — K0510 Chronic gingivitis, plaque induced: Secondary | ICD-10-CM | POA: Diagnosis not present

## 2024-09-26 DIAGNOSIS — L918 Other hypertrophic disorders of the skin: Secondary | ICD-10-CM

## 2024-09-26 NOTE — Progress Notes (Signed)
 Follow-Up Visit   Subjective  Christie Mcdonald is a 63 y.o. female who presents for the following: Desquamative Gengivitis, oral mucosa, 1m f/u, MTX 20mg  1x/wk, 2mg  folic acid  on other days, fluocinonide gel prn, pt was at dentist last week and still had bleeding, burning with brush teeth, tolerable, no new rashes in groin, pt has had some gi upset with MTX, hx of depression in past   The following portions of the chart were reviewed this encounter and updated as appropriate: medications, allergies, medical history  Review of Systems:  No other skin or systemic complaints except as noted in HPI or Assessment and Plan.  Objective  Well appearing patient in no apparent distress; mood and affect are within normal limits.   A focused examination was performed of the following areas: Face, inside mouth  Relevant exam findings are noted in the Assessment and Plan.    Assessment & Plan   CHRONIC DESQUAMATIVE GINGIVITIS, failed methotrexate /folic acid  and topical steroids Clinical diagnosis: erosive gingivitis. 03/22/23 oral biopsy confirms clinical diagnosis but is nonspecific. Ddx includes lichen planus (most likely given multiple past skin biopsies with lichen planus) vs mucous membrane pemphigoid  Oral mucosa Exam: bright red eroded patches on maxillary facial > mandibular facial and maxillary lingual gingivae. Buccal mucosae clear. No wickham striae.  Chronic and persistent condition with duration or expected duration over one year. Condition is bothersome/symptomatic for patient. Currently flared.    07/29/24 CBC diff CMP normal, plan checking labs in 1 month   Treatment Plan: Discussed condition is rare and not many studies are available. Treatment is trial and error. Still unclear if OLP or MMP. No new cutaneous lesions Discussed continuing MTX and adding Otezla (SE diarrhea, GI upset, depression, weight loss) vs adding Dupixent (SE allergy, injection site reaction,  conjunctivitis, head and neck reaction) Discussed d/c MTX and starting a jak inhibitor (discussed side effects) Discussed d/c MTX and starting oral Dapsone (discussed side effects) Jointly decided on adding apremilast  Cont MTX 20 mg once weekly + 2 mg folic acid  on other days.  Start Otezla titrating up to 30mg  1 po bid: 10 mg in the morning on day 1. Titrate upward by additional 10 mg per day on days 2 to 5 as follows: Day 2: 10 mg twice daily; Day 3: 10 mg in the morning and 20 mg in the evening; Day 4: 20 mg twice daily; Day 5: 20 mg in the morning and 30 mg in the evening; Day 6 and onwards 30 mg twice per day  samples given to patient Lot #[id number] exp 01/04/2026  Potential side effects of methotrexate  include immunosuppression, mouth sores, GI upset including nausea, vomiting, and diarrhea, liver inflammation, liver fibrosis (more commonly seen with long term use), bone marrow suppression (decrease in white and red blood cell counts), drug interactions (do not take with sulfa-based antibiotics), and rare pulmonary fibrosis.  It is designated pregnancy category X- do not take if pregnant or trying to get pregnant.  Avoid drinking alcohol while taking methotrexate .  Due to potential side effects, long term medication management with regular office visits and periodic blood testing is necessary while taking methotrexate .   Side effects of Otezla (apremilast) include diarrhea, nausea, headache, upper respiratory infection, depression, and weight decrease (5-10%). It should only be taken by pregnant women after a discussion regarding risks and benefits with their doctor. Goal is control of skin condition, not cure.  The use of Otezla requires long term medication management, including periodic  office visits.   Acrochordons (Skin Tags) Neck Discussed cosmetic fee $115 for up to 15 - Fleshy, skin-colored pedunculated papules - Benign appearing.  - Observe. - If desired, they can be removed with an  in office procedure that is not covered by insurance. - Please call the clinic if you notice any new or changing lesions.    CHRONIC DESQUAMATIVE GINGIVITIS   LICHEN PLANUS   Related Medications methotrexate  (RHEUMATREX) 2.5 MG tablet TAKE 8 TABLETS (20 MG TOTAL) BY MOUTH ONCE A WEEK. CAUTION: CHEMOTHERAPY. PROTECT FROM LIGHT. LONG-TERM USE OF HIGH-RISK MEDICATION   Related Medications methotrexate  (RHEUMATREX) 2.5 MG tablet TAKE 8 TABLETS (20 MG TOTAL) BY MOUTH ONCE A WEEK. CAUTION: CHEMOTHERAPY. PROTECT FROM LIGHT. folic acid  (FOLVITE ) 1 MG tablet Take 2 tablets (2 mg total) by mouth daily. Take every day except the day you take methotrexate  COUNSELING AND COORDINATION OF CARE   [place]    Return in about 1 month (around 10/26/2024) for CHRONIC DESQUAMATIVE GINGIVITIS, lab work due.  I, Grayce Saunas, RMA, am acting as scribe for Boneta Sharps, MD .   Documentation: I have reviewed the above documentation for accuracy and completeness, and I agree with the above.  Boneta Sharps, MD

## 2024-09-26 NOTE — Patient Instructions (Addendum)
 Otezla directions  10 mg in the morning on day 1. Titrate upward by additional 10 mg per day on days 2 to 5 as follows: Day 2: 10 mg twice daily; Day 3: 10 mg in the morning and 20 mg in the evening; Day 4: 20 mg twice daily; Day 5: 20 mg in the morning and 30 mg in the evening; Day 6 and onwards 30 mg twice per day  Cont Methotrexate  2.5mg  8 tablets one time a week, continue Folic Acid  2mg  1 pill daily on days not taking methotrexate  (6 days a week)  Due to recent changes in healthcare laws, you may see results of your pathology and/or laboratory studies on MyChart before the doctors have had a chance to review them. We understand that in some cases there may be results that are confusing or concerning to you. Please understand that not all results are received at the same time and often the doctors may need to interpret multiple results in order to provide you with the best plan of care or course of treatment. Therefore, we ask that you please give us  2 business days to thoroughly review all your results before contacting the office for clarification. Should we see a critical lab result, you will be contacted sooner.   If You Need Anything After Your Visit  If you have any questions or concerns for your doctor, please call our main line at [phone] and press option 4 to reach your doctor's medical assistant. If no one answers, please leave a voicemail as directed and we will return your call as soon as possible. Messages left after 4 pm will be answered the following business day.   You may also send us  a message via MyChart. We typically respond to MyChart messages within 1-2 business days.  For prescription refills, please ask your pharmacy to contact our office. Our fax number is [phone].  If you have an urgent issue when the clinic is closed that cannot wait until the next business day, you can page your doctor at the number below.    Please note that while we do our best to be  available for urgent issues outside of office hours, we are not available 24/7.   If you have an urgent issue and are unable to reach us , you may choose to seek medical care at your doctor's office, retail clinic, urgent care center, or emergency room.  If you have a medical emergency, please immediately call 911 or go to the emergency department.  Pager Numbers  - Dr. Hester: [phone]  - Dr. Jackquline: [phone]  - Dr. Claudene: [phone]   - Dr. Raymund: [phone]  In the event of inclement weather, please call our main line at [phone] for an update on the status of any delays or closures.  Dermatology Medication Tips: Please keep the boxes that topical medications come in in order to help keep track of the instructions about where and how to use these. Pharmacies typically print the medication instructions only on the boxes and not directly on the medication tubes.   If your medication is too expensive, please contact our office at [phone] option 4 or send us  a message through MyChart.   We are unable to tell what your co-pay for medications will be in advance as this is different depending on your insurance coverage. However, we may be able to find a substitute medication at lower cost or fill out paperwork to get insurance to cover a needed medication.  If a prior authorization is required to get your medication covered by your insurance company, please allow us  1-2 business days to complete this process.  Drug prices often vary depending on where the prescription is filled and some pharmacies may offer cheaper prices.  The website www.goodrx.com contains coupons for medications through different pharmacies. The prices here do not account for what the cost may be with help from insurance (it may be cheaper with your insurance), but the website can give you the price if you did not use any insurance.  - You can print the associated coupon and take it with your  prescription to the pharmacy.  - You may also stop by our office during regular business hours and pick up a GoodRx coupon card.  - If you need your prescription sent electronically to a different pharmacy, notify our office through [facility] or by phone at [phone] option 4.     Si Usted Necesita Algo Despus de Su Visita  Tambin puede enviarnos un mensaje a travs de Clinical Cytogeneticist. Por lo general respondemos a los mensajes de MyChart en el transcurso de 1 a 2 das hbiles.  Para renovar recetas, por favor pida a su farmacia que se ponga en contacto con nuestra oficina. Randi lakes de fax es [place] [phone].  Si tiene un asunto urgente cuando la clnica est cerrada y que no puede esperar hasta el siguiente da hbil, puede llamar/localizar a su doctor(a) al nmero que aparece a continuacin.   Por favor, tenga en cuenta que aunque hacemos todo lo posible para estar disponibles para asuntos urgentes fuera del horario de [place], no estamos disponibles las 24 horas del da, los 7 [address]  [phone] de la [place].   Si tiene un problema urgente y no puede comunicarse con nosotros, puede optar por buscar atencin mdica  en el consultorio de su doctor(a), en una clnica privada, en un centro de atencin urgente o en una sala de emergencias.  Si tiene engineer, drilling, por favor llame inmediatamente al 911 o vaya a la sala de emergencias.  Nmeros de bper  - Dr. Hester: [phone]  - Dra. Jackquline: [phone]  - Dr. Claudene: [phone]  - Dra. Kitts: [phone]  En caso de inclemencias del [place], por favor llame a nuestra lnea principal al [phone] para una actualizacin sobre el estado de cualquier retraso o cierre.  Consejos para la medicacin en dermatologa: Por favor, guarde las cajas en las que vienen los medicamentos de uso tpico para ayudarle a seguir las instrucciones sobre dnde y cmo usarlos. Las farmacias generalmente imprimen las instrucciones del  medicamento slo en las cajas y no directamente en los tubos del [place].   Si su medicamento es muy caro, por favor, pngase en contacto con landry rieger llamando al [phone] y presione la opcin 4 o envenos un mensaje a travs de Clinical Cytogeneticist.   No podemos decirle cul ser su copago por los medicamentos por adelantado ya que esto es diferente dependiendo de la cobertura de su seguro. Sin embargo, es posible que podamos encontrar un medicamento sustituto a audiological scientist un formulario para que el seguro cubra el medicamento que se considera necesario.   Si se requiere una autorizacin previa para que su compaa de seguros cubra su medicamento, por favor permtanos de 1 a 2 das hbiles para completar este proceso.  Los precios de los medicamentos varan con frecuencia dependiendo del environmental consultant de dnde se surte la receta y alguna farmacias pueden ofrecer precios ms baratos.  El sitio web www.goodrx.com tiene cupones para medicamentos de health and safety inspector. Los precios aqu no tienen en cuenta lo que podra costar con la ayuda del seguro (puede ser ms barato con su seguro), pero el sitio web puede darle el precio si no utiliz tourist information centre manager.  - Puede imprimir el cupn correspondiente y llevarlo con su receta a la farmacia.  - Tambin puede pasar por nuestra oficina durante el horario de atencin regular y education officer, museum una tarjeta de cupones de GoodRx.  - Si necesita que su receta se enve electrnicamente a una farmacia diferente, informe a nuestra oficina a travs de MyChart de [place] o por telfono llamando al [phone] y presione la opcin 4.

## 2024-10-17 ENCOUNTER — Ambulatory Visit
Admission: RE | Admit: 2024-10-17 | Discharge: 2024-10-17 | Disposition: A | Discharge status: Home / Self Care | Source: Ambulatory Visit | Attending: Family Medicine | Admitting: Family Medicine

## 2024-10-17 DIAGNOSIS — Z1231 Encounter for screening mammogram for malignant neoplasm of breast: Secondary | ICD-10-CM | POA: Insufficient documentation

## 2024-10-28 ENCOUNTER — Ambulatory Visit: Admitting: Dermatology

## 2024-10-28 ENCOUNTER — Encounter: Payer: Self-pay | Admitting: Dermatology

## 2024-10-28 DIAGNOSIS — K0510 Chronic gingivitis, plaque induced: Secondary | ICD-10-CM

## 2024-10-28 DIAGNOSIS — Z7189 Other specified counseling: Secondary | ICD-10-CM

## 2024-10-28 DIAGNOSIS — Z79899 Other long term (current) drug therapy: Secondary | ICD-10-CM

## 2024-10-28 MED ORDER — DAPSONE 25 MG PO TABS: 25.0000 mg | ORAL_TABLET | Freq: Every day | ORAL | 0 refills | Status: DC

## 2024-10-28 NOTE — Patient Instructions (Addendum)
 Stop Methotrexate  Stop Folic Acid  Stop Otezla   Due to recent changes in healthcare laws, you may see results of your pathology and/or laboratory studies on MyChart before the doctors have had a chance to review them. We understand that in some cases there may be results that are confusing or concerning to you. Please understand that not all results are received at the same time and often the doctors may need to interpret multiple results in order to provide you with the best plan of care or course of treatment. Therefore, we ask that you please give us  2 business days to thoroughly review all your results before contacting the office for clarification. Should we see a critical lab result, you will be contacted sooner.   If You Need Anything After Your Visit  If you have any questions or concerns for your doctor, please call our main line at [phone] and press option 4 to reach your doctor's medical assistant. If no one answers, please leave a voicemail as directed and we will return your call as soon as possible. Messages left after 4 pm will be answered the following business day.   You may also send us  a message via MyChart. We typically respond to MyChart messages within 1-2 business days.  For prescription refills, please ask your pharmacy to contact our office. Our fax number is [phone].  If you have an urgent issue when the clinic is closed that cannot wait until the next business day, you can page your doctor at the number below.    Please note that while we do our best to be available for urgent issues outside of office hours, we are not available 24/7.   If you have an urgent issue and are unable to reach us , you may choose to seek medical care at your doctor's office, retail clinic, urgent care center, or emergency room.  If you have a medical emergency, please immediately call 911 or go to the emergency department.  Pager Numbers  - Dr. Hester: [phone]  - Dr.  Jackquline: [phone]  - Dr. Claudene: [phone]   - Dr. Raymund: [phone]  In the event of inclement weather, please call our main line at [phone] for an update on the status of any delays or closures.  Dermatology Medication Tips: Please keep the boxes that topical medications come in in order to help keep track of the instructions about where and how to use these. Pharmacies typically print the medication instructions only on the boxes and not directly on the medication tubes.   If your medication is too expensive, please contact our office at [phone] option 4 or send us  a message through MyChart.   We are unable to tell what your co-pay for medications will be in advance as this is different depending on your insurance coverage. However, we may be able to find a substitute medication at lower cost or fill out paperwork to get insurance to cover a needed medication.   If a prior authorization is required to get your medication covered by your insurance company, please allow us  1-2 business days to complete this process.  Drug prices often vary depending on where the prescription is filled and some pharmacies may offer cheaper prices.  The website www.goodrx.com contains coupons for medications through different pharmacies. The prices here do not account for what the cost may be with help from insurance (it may be cheaper with your insurance), but the website can give you the price if you did not  use any insurance.  - You can print the associated coupon and take it with your prescription to the pharmacy.  - You may also stop by our office during regular business hours and pick up a GoodRx coupon card.  - If you need your prescription sent electronically to a different pharmacy, notify our office through [facility] or by phone at [phone] option 4.     Si Usted Necesita Algo Despus de Su Visita  Tambin puede enviarnos un mensaje a travs de Clinical Cytogeneticist. Por lo  general respondemos a los mensajes de MyChart en el transcurso de 1 a 2 das hbiles.  Para renovar recetas, por favor pida a su farmacia que se ponga en contacto con nuestra oficina. Randi lakes de fax es [place] [phone].  Si tiene un asunto urgente cuando la clnica est cerrada y que no puede esperar hasta el siguiente da hbil, puede llamar/localizar a su doctor(a) al nmero que aparece a continuacin.   Por favor, tenga en cuenta que aunque hacemos todo lo posible para estar disponibles para asuntos urgentes fuera del horario de [place], no estamos disponibles las 24 horas del da, los 7 [phone] de la [place].   Si tiene un problema urgente y no puede comunicarse con nosotros, puede optar por buscar atencin mdica  en el consultorio de su doctor(a), en una clnica privada, en un centro de atencin urgente o en una sala de emergencias.  Si tiene engineer, drilling, por favor llame inmediatamente al 911 o vaya a la sala de emergencias.  Nmeros de bper  - Dr. Hester: [phone]  - Dra. Jackquline: [phone]  - Dr. Claudene: [phone]  - Dra. Kitts: [phone]  En caso de inclemencias del [place], por favor llame a nuestra lnea principal al [phone] para una actualizacin sobre el estado de cualquier retraso o cierre.  Consejos para la medicacin en dermatologa: Por favor, guarde las cajas en las que vienen los medicamentos de uso tpico para ayudarle a seguir las instrucciones sobre dnde y cmo usarlos. Las farmacias generalmente imprimen las instrucciones del medicamento slo en las cajas y no directamente en los tubos del [place].   Si su medicamento es muy caro, por favor, pngase en contacto con landry rieger llamando al [phone] y presione la opcin 4 o envenos un mensaje a travs de Clinical Cytogeneticist.   No podemos decirle cul ser su copago por los medicamentos por adelantado ya que esto es diferente dependiendo de la cobertura de su seguro. Sin embargo, es  posible que podamos encontrar un medicamento sustituto a audiological scientist un formulario para que el seguro cubra el medicamento que se considera necesario.   Si se requiere una autorizacin previa para que su compaa de seguros cubra su medicamento, por favor permtanos de 1 a 2 das hbiles para completar este proceso.  Los precios de los medicamentos varan con frecuencia dependiendo del environmental consultant de dnde se surte la receta y alguna farmacias pueden ofrecer precios ms baratos.  El sitio web www.goodrx.com tiene cupones para medicamentos de health and safety inspector. Los precios aqu no tienen en cuenta lo que podra costar con la ayuda del seguro (puede ser ms barato con su seguro), pero el sitio web puede darle el precio si no utiliz tourist information centre manager.  - Puede imprimir el cupn correspondiente y llevarlo con su receta a la farmacia.  - Tambin puede pasar por nuestra oficina durante el horario de atencin regular y education officer, museum una tarjeta de cupones de GoodRx.  - Si necesita que  su receta se enve electrnicamente a una farmacia diferente, informe a nuestra oficina a travs de MyChart de [place] o por telfono llamando al [phone] y presione la opcin 4.

## 2024-10-28 NOTE — Progress Notes (Signed)
" ° °  Follow-Up Visit   Subjective  Christie Mcdonald is a 63 y.o. female who presents for the following: Desquamative Gingivitis  50m f/u, MTX 20mg  once weekly, 2mg  folic acid  6d/wk, Otezla 30mg  1 po bid no side effects, pt not sure if any improvement, pt does think when she takes Otezla on day of MTX she gets real sleepy   The following portions of the chart were reviewed this encounter and updated as appropriate: medications, allergies, medical history  Review of Systems:  No other skin or systemic complaints except as noted in HPI or Assessment and Plan.  Objective  Well appearing patient in no apparent distress; mood and affect are within normal limits.   A focused examination was performed of the following areas: Face, mouth  Relevant exam findings are noted in the Assessment and Plan.    Assessment & Plan   CHRONIC DESQUAMATIVE GINGIVITIS, failed methotrexate  20 mg weekly + folic acid , apremilast 30 mg daily x 1 month, and topical steroids Clinical diagnosis: erosive gingivitis. 03/22/23 oral biopsy confirms clinical diagnosis but is nonspecific. Ddx includes lichen planus (most likely given multiple past skin biopsies with lichen planus) vs mucous membrane pemphigoid vs linear IGA Oral mucosa Exam: broad erosions on central maxillary facial gingiva extending from margin. Beefy red erosions along margin on central mandibular facial and lingual gingiva   11/27/23 G6PD normal 07/29/24 CBC CMP normal  Chronic and persistent condition with duration or expected duration over one year. Condition is bothersome/symptomatic for patient. Currently flared.   Treatment: Pending labs CBC CMP and 48 hours off Otezla and MTX start Dapsone  25mg  1 po every day. Last MTX was 12/17 Dapsone  SE fatigue headache GI upset methemoglobinemia hemolysis agranulocytosis peripheral neuropathy D/C MTX D/C Folic Acid  D/C Otezla Discussed referral with [facility]. Culton at [facility] if not improving on Dapsone . Need DIF   CHRONIC DESQUAMATIVE GINGIVITIS   This Visit - CBC with Differential/Platelets - CMP LONG-TERM USE OF HIGH-RISK MEDICATION   COUNSELING AND COORDINATION OF CARE   MEDICATION MANAGEMENT    Return in about 1 month (around 11/28/2024) for Desquamative gingivitis.  I, Grayce Saunas, RMA, am acting as scribe for Boneta Sharps, MD .   Documentation: I have reviewed the above documentation for accuracy and completeness, and I agree with the above.  Boneta Sharps, MD    "

## 2024-10-29 LAB — COMPREHENSIVE METABOLIC PANEL WITH GFR
ALT: 16 IU/L (ref 0–32)
AST: 17 IU/L (ref 0–40)
Albumin: 4.6 g/dL (ref 3.9–4.9)
Alkaline Phosphatase: 112 IU/L (ref 49–135)
BUN/Creatinine Ratio: 16 (ref 12–28)
BUN: 10 mg/dL (ref 8–27)
Bilirubin Total: 0.3 mg/dL (ref 0.0–1.2)
CO2: 23 mmol/L (ref 20–29)
Calcium: 10 mg/dL (ref 8.7–10.3)
Chloride: 96 mmol/L (ref 96–106)
Creatinine, Ser: 0.62 mg/dL (ref 0.57–1.00)
Globulin, Total: 2.7 g/dL (ref 1.5–4.5)
Glucose: 100 mg/dL — ABNORMAL HIGH (ref 70–99)
Potassium: 4.5 mmol/L (ref 3.5–5.2)
Sodium: 137 mmol/L (ref 134–144)
Total Protein: 7.3 g/dL (ref 6.0–8.5)
eGFR: 99 mL/min/1.73

## 2024-10-29 LAB — CBC WITH DIFFERENTIAL/PLATELET
Basophils Absolute: 0.1 x10E3/uL (ref 0.0–0.2)
Basos: 1 %
EOS (ABSOLUTE): 0.2 x10E3/uL (ref 0.0–0.4)
Eos: 2 %
Hematocrit: 40.6 % (ref 34.0–46.6)
Hemoglobin: 13.4 g/dL (ref 11.1–15.9)
Immature Grans (Abs): 0 x10E3/uL (ref 0.0–0.1)
Immature Granulocytes: 0 %
Lymphocytes Absolute: 1.3 x10E3/uL (ref 0.7–3.1)
Lymphs: 17 %
MCH: 31.7 pg (ref 26.6–33.0)
MCHC: 33 g/dL (ref 31.5–35.7)
MCV: 96 fL (ref 79–97)
Monocytes Absolute: 0.5 x10E3/uL (ref 0.1–0.9)
Monocytes: 7 %
Neutrophils Absolute: 5.5 x10E3/uL (ref 1.4–7.0)
Neutrophils: 73 %
Platelets: 434 x10E3/uL (ref 150–450)
RBC: 4.23 x10E6/uL (ref 3.77–5.28)
RDW: 14.6 % (ref 11.7–15.4)
WBC: 7.5 x10E3/uL (ref 3.4–10.8)

## 2024-11-02 ENCOUNTER — Other Ambulatory Visit: Payer: Self-pay | Admitting: Family Medicine

## 2024-11-02 DIAGNOSIS — J302 Other seasonal allergic rhinitis: Secondary | ICD-10-CM

## 2024-11-04 ENCOUNTER — Ambulatory Visit: Payer: Self-pay | Admitting: Dermatology

## 2024-11-05 NOTE — Telephone Encounter (Signed)
 Requested Prescriptions  Pending Prescriptions Disp Refills   fluticasone  (FLONASE ) 50 MCG/ACT nasal spray [Pharmacy Med Name: FLUTICASONE  PROP 50 MCG SPRAY] 16 mL 0    Sig: SPRAY 2 SPRAYS INTO EACH NOSTRIL EVERY DAY     Ear, Nose, and Throat: Nasal Preparations - Corticosteroids Passed - 11/05/2024  9:40 AM      Passed - Valid encounter within last 12 months    Recent Outpatient Visits           2 months ago Hyperglycemia   [facility] Glen[facility] Mire, MD   7 months ago Other infective acute otitis externa of left ear   [facility] Glenard Mire, MD   8 months ago Morbid obesity [facility])   [facility] Glenard Mire, MD       Future Appointments             In 3 weeks Claudene Lehmann, MD [facility]

## 2024-11-06 ENCOUNTER — Other Ambulatory Visit: Payer: Self-pay | Admitting: Dermatology

## 2024-11-06 DIAGNOSIS — Z79899 Other long term (current) drug therapy: Secondary | ICD-10-CM

## 2024-11-12 ENCOUNTER — Encounter: Payer: Self-pay | Admitting: Dermatology

## 2024-11-14 LAB — CBC WITH DIFFERENTIAL/PLATELET
Basophils Absolute: 0.1 x10E3/uL (ref 0.0–0.2)
Basos: 2 %
EOS (ABSOLUTE): 0.3 x10E3/uL (ref 0.0–0.4)
Eos: 4 %
Hematocrit: 38.1 % (ref 34.0–46.6)
Hemoglobin: 12.8 g/dL (ref 11.1–15.9)
Immature Grans (Abs): 0 x10E3/uL (ref 0.0–0.1)
Immature Granulocytes: 0 %
Lymphocytes Absolute: 1.4 x10E3/uL (ref 0.7–3.1)
Lymphs: 20 %
MCH: 32.3 pg (ref 26.6–33.0)
MCHC: 33.6 g/dL (ref 31.5–35.7)
MCV: 96 fL (ref 79–97)
Monocytes Absolute: 0.5 x10E3/uL (ref 0.1–0.9)
Monocytes: 8 %
Neutrophils Absolute: 4.8 x10E3/uL (ref 1.4–7.0)
Neutrophils: 66 %
Platelets: 348 x10E3/uL (ref 150–450)
RBC: 3.96 x10E6/uL (ref 3.77–5.28)
RDW: 13.8 % (ref 11.7–15.4)
WBC: 7.2 x10E3/uL (ref 3.4–10.8)

## 2024-11-17 ENCOUNTER — Ambulatory Visit: Payer: Self-pay | Admitting: Dermatology

## 2024-11-17 ENCOUNTER — Other Ambulatory Visit: Payer: Self-pay | Admitting: Dermatology

## 2024-11-17 DIAGNOSIS — Z79899 Other long term (current) drug therapy: Secondary | ICD-10-CM

## 2024-11-20 ENCOUNTER — Other Ambulatory Visit: Payer: Self-pay | Admitting: Dermatology

## 2024-11-21 ENCOUNTER — Other Ambulatory Visit: Payer: Self-pay | Admitting: Dermatology

## 2024-11-21 ENCOUNTER — Ambulatory Visit: Payer: Self-pay | Admitting: Dermatology

## 2024-11-21 DIAGNOSIS — Z79899 Other long term (current) drug therapy: Secondary | ICD-10-CM

## 2024-11-21 LAB — CBC WITH DIFFERENTIAL/PLATELET
Basophils Absolute: 0.1 x10E3/uL (ref 0.0–0.2)
Basos: 1 %
EOS (ABSOLUTE): 0.3 x10E3/uL (ref 0.0–0.4)
Eos: 4 %
Hematocrit: 37.5 % (ref 34.0–46.6)
Hemoglobin: 12.4 g/dL (ref 11.1–15.9)
Immature Grans (Abs): 0 x10E3/uL (ref 0.0–0.1)
Immature Granulocytes: 0 %
Lymphocytes Absolute: 1.2 x10E3/uL (ref 0.7–3.1)
Lymphs: 18 %
MCH: 31.5 pg (ref 26.6–33.0)
MCHC: 33.1 g/dL (ref 31.5–35.7)
MCV: 95 fL (ref 79–97)
Monocytes Absolute: 0.5 x10E3/uL (ref 0.1–0.9)
Monocytes: 7 %
Neutrophils Absolute: 4.9 x10E3/uL (ref 1.4–7.0)
Neutrophils: 70 %
Platelets: 330 x10E3/uL (ref 150–450)
RBC: 3.94 x10E6/uL (ref 3.77–5.28)
RDW: 13.8 % (ref 11.7–15.4)
WBC: 7 x10E3/uL (ref 3.4–10.8)

## 2024-11-28 ENCOUNTER — Ambulatory Visit: Payer: Self-pay | Admitting: Dermatology

## 2024-11-28 ENCOUNTER — Ambulatory Visit: Admitting: Dermatology

## 2024-11-28 ENCOUNTER — Encounter: Payer: Self-pay | Admitting: Dermatology

## 2024-11-28 DIAGNOSIS — Z7189 Other specified counseling: Secondary | ICD-10-CM

## 2024-11-28 DIAGNOSIS — K0510 Chronic gingivitis, plaque induced: Secondary | ICD-10-CM

## 2024-11-28 DIAGNOSIS — Z79899 Other long term (current) drug therapy: Secondary | ICD-10-CM

## 2024-11-28 LAB — CBC WITH DIFFERENTIAL/PLATELET
Basophils Absolute: 0.1 x10E3/uL (ref 0.0–0.2)
Basos: 1 %
EOS (ABSOLUTE): 0.4 x10E3/uL (ref 0.0–0.4)
Eos: 5 %
Hematocrit: 37.6 % (ref 34.0–46.6)
Hemoglobin: 12 g/dL (ref 11.1–15.9)
Immature Grans (Abs): 0 x10E3/uL (ref 0.0–0.1)
Immature Granulocytes: 0 %
Lymphocytes Absolute: 1.3 x10E3/uL (ref 0.7–3.1)
Lymphs: 18 %
MCH: 31.1 pg (ref 26.6–33.0)
MCHC: 31.9 g/dL (ref 31.5–35.7)
MCV: 97 fL (ref 79–97)
Monocytes Absolute: 0.5 x10E3/uL (ref 0.1–0.9)
Monocytes: 7 %
Neutrophils Absolute: 4.9 x10E3/uL (ref 1.4–7.0)
Neutrophils: 69 %
Platelets: 319 x10E3/uL (ref 150–450)
RBC: 3.86 x10E6/uL (ref 3.77–5.28)
RDW: 13.8 % (ref 11.7–15.4)
WBC: 7.3 x10E3/uL (ref 3.4–10.8)

## 2024-11-28 NOTE — Progress Notes (Signed)
" ° °  Follow-Up Visit   Subjective  Christie Mcdonald is a 64 y.o. female who presents for the following: CHRONIC DESQUAMATIVE GINGIVITIS, Dapsone  25mg  1 po qd, pt thinks some improvement, no se from Dapsone . Less pain with brushing   The following portions of the chart were reviewed this encounter and updated as appropriate: medications, allergies, medical history  Review of Systems:  No other skin or systemic complaints except as noted in HPI or Assessment and Plan.  Objective  Well appearing patient in no apparent distress; mood and affect are within normal limits.   A focused examination was performed of the following areas: mouth  Relevant exam findings are noted in the Assessment and Plan.    Assessment & Plan   CHRONIC DESQUAMATIVE GINGIVITIS, failed methotrexate  20 mg weekly + folic acid , apremilast 30 mg daily x 1 month, and topical steroids. IMPROVING on Dapsone  but still flaring Clinical diagnosis: erosive gingivitis. 03/22/23 oral biopsy confirms clinical diagnosis but is nonspecific. Ddx MMP vs OLP vs linear IGA Oral mucosa Exam: healed erosions on central maxillary facial gingiva extending from margin. Reduced Beefy red erosions along margin on central mandibular facial and lingual gingiva   11/27/23 G6PD normal 07/29/24 CBC CMP normal   Chronic and persistent condition with duration or expected duration over one year. Condition is improving with treatment but not currently at goal.    Treatment: Cont Dapsone  25mg  1 po every day. Contact if flaring, can increase dose if tolerating Will recheck CBC with Diff/Platelets in 1 week  Discussed Dapsone  SE fatigue headache GI upset methemoglobinemia hemolysis agranulocytosis peripheral neuropathy. Go to ED if fever dyspnea fatigue blue discoloration occur   CHRONIC DESQUAMATIVE GINGIVITIS   LONG-TERM USE OF HIGH-RISK MEDICATION   This Visit - CBC with Differential/Platelets COUNSELING AND COORDINATION OF  CARE   MEDICATION MANAGEMENT    Return in about 1 month (around 12/29/2024) for CHRONIC DESQUAMATIVE GINGIVITIS f/u.  I, Sonya Hupman, RMA, am acting as scribe for Boneta Sharps, MD .   Documentation: I have reviewed the above documentation for accuracy and completeness, and I agree with the above.  Boneta Sharps, MD    "

## 2024-11-28 NOTE — Patient Instructions (Signed)

## 2024-12-05 ENCOUNTER — Ambulatory Visit: Payer: Self-pay | Admitting: Dermatology

## 2024-12-05 DIAGNOSIS — Z79899 Other long term (current) drug therapy: Secondary | ICD-10-CM

## 2024-12-05 LAB — CBC WITH DIFFERENTIAL/PLATELET
Basophils Absolute: 0.1 10*3/uL (ref 0.0–0.2)
Basos: 1 %
EOS (ABSOLUTE): 0.4 10*3/uL (ref 0.0–0.4)
Eos: 6 %
Hematocrit: 36.5 % (ref 34.0–46.6)
Hemoglobin: 11.7 g/dL (ref 11.1–15.9)
Immature Grans (Abs): 0 10*3/uL (ref 0.0–0.1)
Immature Granulocytes: 0 %
Lymphocytes Absolute: 1.2 10*3/uL (ref 0.7–3.1)
Lymphs: 20 %
MCH: 31 pg (ref 26.6–33.0)
MCHC: 32.1 g/dL (ref 31.5–35.7)
MCV: 97 fL (ref 79–97)
Monocytes Absolute: 0.4 10*3/uL (ref 0.1–0.9)
Monocytes: 7 %
Neutrophils Absolute: 4.1 10*3/uL (ref 1.4–7.0)
Neutrophils: 66 %
Platelets: 334 10*3/uL (ref 150–450)
RBC: 3.78 x10E6/uL (ref 3.77–5.28)
RDW: 13.3 % (ref 11.7–15.4)
WBC: 6.2 10*3/uL (ref 3.4–10.8)

## 2024-12-06 ENCOUNTER — Other Ambulatory Visit: Payer: Self-pay | Admitting: Family Medicine

## 2024-12-06 DIAGNOSIS — J302 Other seasonal allergic rhinitis: Secondary | ICD-10-CM

## 2025-01-02 ENCOUNTER — Ambulatory Visit: Admitting: Dermatology

## 2025-02-20 ENCOUNTER — Ambulatory Visit: Admitting: Family Medicine
